# Patient Record
Sex: Female | Born: 1955 | Race: White | Hispanic: No | Smoking: Former smoker
Health system: Southern US, Community
[De-identification: ages and names within clinical notes are randomized; demographics above are authoritative.]

## PROBLEM LIST (undated history)

## (undated) DIAGNOSIS — J449 Chronic obstructive pulmonary disease, unspecified: Secondary | ICD-10-CM

## (undated) DIAGNOSIS — M549 Dorsalgia, unspecified: Secondary | ICD-10-CM

## (undated) DIAGNOSIS — R000 Tachycardia, unspecified: Secondary | ICD-10-CM

## (undated) DIAGNOSIS — M1990 Unspecified osteoarthritis, unspecified site: Secondary | ICD-10-CM

## (undated) DIAGNOSIS — G40409 Other generalized epilepsy and epileptic syndromes, not intractable, without status epilepticus: Secondary | ICD-10-CM

## (undated) DIAGNOSIS — H409 Unspecified glaucoma: Secondary | ICD-10-CM

## (undated) DIAGNOSIS — Z780 Asymptomatic menopausal state: Secondary | ICD-10-CM

## (undated) DIAGNOSIS — R569 Unspecified convulsions: Secondary | ICD-10-CM

## (undated) DIAGNOSIS — G8929 Other chronic pain: Secondary | ICD-10-CM

## (undated) DIAGNOSIS — G43909 Migraine, unspecified, not intractable, without status migrainosus: Secondary | ICD-10-CM

## (undated) DIAGNOSIS — G4700 Insomnia, unspecified: Secondary | ICD-10-CM

## (undated) DIAGNOSIS — I7090 Unspecified atherosclerosis: Secondary | ICD-10-CM

## (undated) DIAGNOSIS — G2581 Restless legs syndrome: Secondary | ICD-10-CM

## (undated) DIAGNOSIS — F32A Depression, unspecified: Secondary | ICD-10-CM

## (undated) DIAGNOSIS — R519 Headache, unspecified: Secondary | ICD-10-CM

## (undated) DIAGNOSIS — Z8719 Personal history of other diseases of the digestive system: Secondary | ICD-10-CM

## (undated) DIAGNOSIS — X838XXA Intentional self-harm by other specified means, initial encounter: Secondary | ICD-10-CM

## (undated) DIAGNOSIS — F329 Major depressive disorder, single episode, unspecified: Secondary | ICD-10-CM

## (undated) DIAGNOSIS — R51 Headache: Secondary | ICD-10-CM

## (undated) DIAGNOSIS — J189 Pneumonia, unspecified organism: Secondary | ICD-10-CM

## (undated) DIAGNOSIS — F419 Anxiety disorder, unspecified: Secondary | ICD-10-CM

## (undated) HISTORY — DX: Asymptomatic menopausal state: Z78.0

## (undated) HISTORY — DX: Other chronic pain: G89.29

## (undated) HISTORY — DX: Anxiety disorder, unspecified: F41.9

## (undated) HISTORY — DX: Dorsalgia, unspecified: M54.9

## (undated) HISTORY — DX: Intentional self-harm by other specified means, initial encounter: X83.8XXA

## (undated) HISTORY — DX: Unspecified convulsions: R56.9

## (undated) HISTORY — DX: Insomnia, unspecified: G47.00

---
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
## 1982-10-05 HISTORY — PX: TUBAL LIGATION: SHX77

## 1993-10-05 ENCOUNTER — Encounter (INDEPENDENT_AMBULATORY_CARE_PROVIDER_SITE_OTHER): Payer: Self-pay | Admitting: *Deleted

## 1993-10-05 LAB — CONVERTED CEMR LAB

## 1998-05-22 ENCOUNTER — Encounter: Admission: RE | Admit: 1998-05-22 | Discharge: 1998-05-22 | Payer: Self-pay | Admitting: Family Medicine

## 1998-06-24 ENCOUNTER — Encounter: Admission: RE | Admit: 1998-06-24 | Discharge: 1998-06-24 | Payer: Self-pay | Admitting: Family Medicine

## 1998-07-12 ENCOUNTER — Encounter: Admission: RE | Admit: 1998-07-12 | Discharge: 1998-07-12 | Payer: Self-pay | Admitting: Family Medicine

## 1998-09-13 ENCOUNTER — Encounter: Admission: RE | Admit: 1998-09-13 | Discharge: 1998-09-13 | Payer: Self-pay | Admitting: Family Medicine

## 1998-10-05 HISTORY — PX: ABDOMINAL HYSTERECTOMY: SHX81

## 1999-03-04 ENCOUNTER — Encounter: Admission: RE | Admit: 1999-03-04 | Discharge: 1999-03-04 | Payer: Self-pay | Admitting: Family Medicine

## 1999-03-12 ENCOUNTER — Encounter: Admission: RE | Admit: 1999-03-12 | Discharge: 1999-03-12 | Payer: Self-pay | Admitting: Family Medicine

## 1999-03-26 ENCOUNTER — Encounter: Admission: RE | Admit: 1999-03-26 | Discharge: 1999-03-26 | Payer: Self-pay | Admitting: Family Medicine

## 1999-03-27 ENCOUNTER — Other Ambulatory Visit: Admission: RE | Admit: 1999-03-27 | Discharge: 1999-03-27 | Payer: Self-pay

## 1999-03-27 ENCOUNTER — Encounter: Admission: RE | Admit: 1999-03-27 | Discharge: 1999-03-27 | Payer: Self-pay | Admitting: Obstetrics

## 1999-03-27 ENCOUNTER — Other Ambulatory Visit: Admission: RE | Admit: 1999-03-27 | Discharge: 1999-03-27 | Payer: Self-pay | Admitting: Obstetrics

## 2002-02-11 ENCOUNTER — Encounter: Payer: Self-pay | Admitting: *Deleted

## 2002-02-11 ENCOUNTER — Emergency Department (HOSPITAL_COMMUNITY): Admission: EM | Admit: 2002-02-11 | Discharge: 2002-02-11 | Payer: Self-pay | Admitting: *Deleted

## 2002-03-21 ENCOUNTER — Encounter: Admission: RE | Admit: 2002-03-21 | Discharge: 2002-03-21 | Payer: Self-pay | Admitting: Family Medicine

## 2002-06-08 ENCOUNTER — Encounter: Admission: RE | Admit: 2002-06-08 | Discharge: 2002-06-08 | Payer: Self-pay | Admitting: Family Medicine

## 2002-07-24 ENCOUNTER — Encounter: Admission: RE | Admit: 2002-07-24 | Discharge: 2002-07-24 | Payer: Self-pay | Admitting: Family Medicine

## 2002-12-08 ENCOUNTER — Encounter: Admission: RE | Admit: 2002-12-08 | Discharge: 2002-12-08 | Payer: Self-pay | Admitting: Family Medicine

## 2003-05-08 ENCOUNTER — Encounter: Admission: RE | Admit: 2003-05-08 | Discharge: 2003-05-08 | Payer: Self-pay | Admitting: Sports Medicine

## 2004-11-20 ENCOUNTER — Emergency Department (HOSPITAL_COMMUNITY): Admission: EM | Admit: 2004-11-20 | Discharge: 2004-11-20 | Payer: Self-pay | Admitting: Emergency Medicine

## 2004-11-28 ENCOUNTER — Ambulatory Visit (HOSPITAL_COMMUNITY): Admission: RE | Admit: 2004-11-28 | Discharge: 2004-11-28 | Payer: Self-pay | Admitting: Orthopaedic Surgery

## 2005-02-04 ENCOUNTER — Encounter (HOSPITAL_COMMUNITY): Admission: RE | Admit: 2005-02-04 | Discharge: 2005-03-06 | Payer: Self-pay | Admitting: Orthopaedic Surgery

## 2005-04-17 ENCOUNTER — Ambulatory Visit: Payer: Self-pay | Admitting: Family Medicine

## 2005-07-01 ENCOUNTER — Emergency Department (HOSPITAL_COMMUNITY): Admission: EM | Admit: 2005-07-01 | Discharge: 2005-07-01 | Payer: Self-pay | Admitting: Emergency Medicine

## 2005-07-08 ENCOUNTER — Emergency Department (HOSPITAL_COMMUNITY): Admission: EM | Admit: 2005-07-08 | Discharge: 2005-07-09 | Payer: Self-pay | Admitting: Emergency Medicine

## 2005-07-17 ENCOUNTER — Ambulatory Visit: Payer: Self-pay | Admitting: Family Medicine

## 2005-10-05 HISTORY — PX: BILATERAL OOPHORECTOMY: SHX1221

## 2005-12-09 ENCOUNTER — Ambulatory Visit (HOSPITAL_COMMUNITY): Admission: RE | Admit: 2005-12-09 | Discharge: 2005-12-09 | Payer: Self-pay | Admitting: Orthopaedic Surgery

## 2006-01-15 ENCOUNTER — Ambulatory Visit: Payer: Self-pay | Admitting: Family Medicine

## 2006-02-12 ENCOUNTER — Ambulatory Visit: Payer: Self-pay | Admitting: Family Medicine

## 2006-02-26 ENCOUNTER — Ambulatory Visit: Payer: Self-pay | Admitting: Family Medicine

## 2006-03-02 ENCOUNTER — Ambulatory Visit: Payer: Self-pay | Admitting: Gastroenterology

## 2006-03-04 ENCOUNTER — Encounter: Admission: RE | Admit: 2006-03-04 | Discharge: 2006-03-04 | Payer: Self-pay | Admitting: Sports Medicine

## 2006-03-05 ENCOUNTER — Ambulatory Visit: Payer: Self-pay | Admitting: Gastroenterology

## 2006-03-12 ENCOUNTER — Ambulatory Visit: Payer: Self-pay | Admitting: Family Medicine

## 2006-03-16 ENCOUNTER — Ambulatory Visit (HOSPITAL_COMMUNITY): Admission: RE | Admit: 2006-03-16 | Discharge: 2006-03-16 | Payer: Self-pay | Admitting: Family Medicine

## 2006-03-26 ENCOUNTER — Ambulatory Visit: Payer: Self-pay | Admitting: Family Medicine

## 2006-03-26 ENCOUNTER — Ambulatory Visit: Payer: Self-pay | Admitting: Gynecology

## 2006-03-31 ENCOUNTER — Ambulatory Visit: Payer: Self-pay | Admitting: Obstetrics and Gynecology

## 2006-04-09 ENCOUNTER — Ambulatory Visit: Payer: Self-pay | Admitting: Gastroenterology

## 2006-04-20 ENCOUNTER — Ambulatory Visit: Payer: Self-pay | Admitting: Gastroenterology

## 2006-04-27 ENCOUNTER — Encounter: Admission: RE | Admit: 2006-04-27 | Discharge: 2006-04-27 | Payer: Self-pay | Admitting: Family Medicine

## 2006-05-04 ENCOUNTER — Encounter: Admission: RE | Admit: 2006-05-04 | Discharge: 2006-05-04 | Payer: Self-pay | Admitting: Family Medicine

## 2006-05-04 ENCOUNTER — Ambulatory Visit: Payer: Self-pay | Admitting: Family Medicine

## 2006-05-12 ENCOUNTER — Ambulatory Visit: Payer: Self-pay | Admitting: Obstetrics & Gynecology

## 2006-05-12 ENCOUNTER — Encounter: Payer: Self-pay | Admitting: Obstetrics & Gynecology

## 2006-05-17 ENCOUNTER — Ambulatory Visit: Payer: Self-pay | Admitting: Family Medicine

## 2006-06-08 ENCOUNTER — Encounter (INDEPENDENT_AMBULATORY_CARE_PROVIDER_SITE_OTHER): Payer: Self-pay | Admitting: *Deleted

## 2006-06-08 ENCOUNTER — Ambulatory Visit: Payer: Self-pay | Admitting: Obstetrics & Gynecology

## 2006-06-08 ENCOUNTER — Inpatient Hospital Stay (HOSPITAL_COMMUNITY): Admission: RE | Admit: 2006-06-08 | Discharge: 2006-06-10 | Payer: Self-pay | Admitting: Obstetrics & Gynecology

## 2006-06-15 ENCOUNTER — Ambulatory Visit: Payer: Self-pay | Admitting: Obstetrics and Gynecology

## 2006-07-14 ENCOUNTER — Ambulatory Visit: Payer: Self-pay | Admitting: Obstetrics and Gynecology

## 2006-07-20 ENCOUNTER — Ambulatory Visit: Payer: Self-pay | Admitting: Family Medicine

## 2006-12-01 ENCOUNTER — Ambulatory Visit: Payer: Self-pay | Admitting: Family Medicine

## 2006-12-02 DIAGNOSIS — F341 Dysthymic disorder: Secondary | ICD-10-CM

## 2006-12-02 DIAGNOSIS — G2589 Other specified extrapyramidal and movement disorders: Secondary | ICD-10-CM

## 2006-12-02 DIAGNOSIS — G4700 Insomnia, unspecified: Secondary | ICD-10-CM

## 2006-12-02 DIAGNOSIS — J309 Allergic rhinitis, unspecified: Secondary | ICD-10-CM | POA: Insufficient documentation

## 2006-12-02 DIAGNOSIS — R55 Syncope and collapse: Secondary | ICD-10-CM | POA: Insufficient documentation

## 2006-12-02 DIAGNOSIS — K219 Gastro-esophageal reflux disease without esophagitis: Secondary | ICD-10-CM

## 2006-12-02 DIAGNOSIS — M545 Low back pain: Secondary | ICD-10-CM

## 2006-12-03 ENCOUNTER — Encounter (INDEPENDENT_AMBULATORY_CARE_PROVIDER_SITE_OTHER): Payer: Self-pay | Admitting: *Deleted

## 2006-12-15 ENCOUNTER — Encounter (INDEPENDENT_AMBULATORY_CARE_PROVIDER_SITE_OTHER): Payer: Self-pay | Admitting: Family Medicine

## 2006-12-20 ENCOUNTER — Ambulatory Visit: Payer: Self-pay | Admitting: Family Medicine

## 2006-12-20 DIAGNOSIS — F17200 Nicotine dependence, unspecified, uncomplicated: Secondary | ICD-10-CM

## 2007-01-12 ENCOUNTER — Telehealth: Payer: Self-pay | Admitting: *Deleted

## 2007-01-13 ENCOUNTER — Ambulatory Visit: Payer: Self-pay | Admitting: Family Medicine

## 2007-01-18 ENCOUNTER — Telehealth: Payer: Self-pay | Admitting: *Deleted

## 2007-02-01 ENCOUNTER — Ambulatory Visit: Payer: Self-pay | Admitting: Family Medicine

## 2007-02-18 ENCOUNTER — Telehealth: Payer: Self-pay | Admitting: *Deleted

## 2007-03-08 ENCOUNTER — Telehealth: Payer: Self-pay | Admitting: *Deleted

## 2007-03-08 ENCOUNTER — Ambulatory Visit: Payer: Self-pay | Admitting: *Deleted

## 2007-03-31 ENCOUNTER — Telehealth (INDEPENDENT_AMBULATORY_CARE_PROVIDER_SITE_OTHER): Payer: Self-pay | Admitting: Family Medicine

## 2007-04-01 ENCOUNTER — Telehealth: Payer: Self-pay | Admitting: *Deleted

## 2007-04-04 ENCOUNTER — Ambulatory Visit: Payer: Self-pay | Admitting: Sports Medicine

## 2007-04-04 DIAGNOSIS — Z8719 Personal history of other diseases of the digestive system: Secondary | ICD-10-CM | POA: Insufficient documentation

## 2007-04-13 ENCOUNTER — Telehealth (INDEPENDENT_AMBULATORY_CARE_PROVIDER_SITE_OTHER): Payer: Self-pay | Admitting: Family Medicine

## 2007-04-17 ENCOUNTER — Telehealth (INDEPENDENT_AMBULATORY_CARE_PROVIDER_SITE_OTHER): Payer: Self-pay | Admitting: Family Medicine

## 2007-04-19 ENCOUNTER — Encounter (INDEPENDENT_AMBULATORY_CARE_PROVIDER_SITE_OTHER): Payer: Self-pay | Admitting: Family Medicine

## 2007-06-02 ENCOUNTER — Encounter (INDEPENDENT_AMBULATORY_CARE_PROVIDER_SITE_OTHER): Payer: Self-pay | Admitting: Family Medicine

## 2007-07-14 ENCOUNTER — Telehealth (INDEPENDENT_AMBULATORY_CARE_PROVIDER_SITE_OTHER): Payer: Self-pay | Admitting: Family Medicine

## 2007-08-16 ENCOUNTER — Telehealth: Payer: Self-pay | Admitting: *Deleted

## 2007-08-29 ENCOUNTER — Ambulatory Visit: Payer: Self-pay | Admitting: Sports Medicine

## 2007-08-29 ENCOUNTER — Encounter: Admission: RE | Admit: 2007-08-29 | Discharge: 2007-08-29 | Payer: Self-pay | Admitting: Sports Medicine

## 2007-08-31 ENCOUNTER — Telehealth (INDEPENDENT_AMBULATORY_CARE_PROVIDER_SITE_OTHER): Payer: Self-pay | Admitting: Family Medicine

## 2007-09-05 ENCOUNTER — Ambulatory Visit: Payer: Self-pay | Admitting: Sports Medicine

## 2007-10-04 ENCOUNTER — Telehealth: Payer: Self-pay | Admitting: *Deleted

## 2007-10-05 ENCOUNTER — Telehealth (INDEPENDENT_AMBULATORY_CARE_PROVIDER_SITE_OTHER): Payer: Self-pay | Admitting: Family Medicine

## 2007-10-12 ENCOUNTER — Encounter (INDEPENDENT_AMBULATORY_CARE_PROVIDER_SITE_OTHER): Payer: Self-pay | Admitting: Family Medicine

## 2007-12-14 ENCOUNTER — Encounter (INDEPENDENT_AMBULATORY_CARE_PROVIDER_SITE_OTHER): Payer: Self-pay | Admitting: Family Medicine

## 2008-01-03 ENCOUNTER — Telehealth (INDEPENDENT_AMBULATORY_CARE_PROVIDER_SITE_OTHER): Payer: Self-pay | Admitting: Family Medicine

## 2008-01-16 ENCOUNTER — Encounter (INDEPENDENT_AMBULATORY_CARE_PROVIDER_SITE_OTHER): Payer: Self-pay | Admitting: Family Medicine

## 2008-01-16 ENCOUNTER — Ambulatory Visit: Payer: Self-pay | Admitting: Family Medicine

## 2008-01-16 DIAGNOSIS — Z862 Personal history of diseases of the blood and blood-forming organs and certain disorders involving the immune mechanism: Secondary | ICD-10-CM | POA: Insufficient documentation

## 2008-01-20 ENCOUNTER — Telehealth: Payer: Self-pay | Admitting: *Deleted

## 2008-01-20 ENCOUNTER — Encounter (INDEPENDENT_AMBULATORY_CARE_PROVIDER_SITE_OTHER): Payer: Self-pay | Admitting: Family Medicine

## 2008-01-20 LAB — CONVERTED CEMR LAB
ALT: 12 units/L (ref 0–35)
AST: 16 units/L (ref 0–37)
Albumin: 4.2 g/dL (ref 3.5–5.2)
Calcium: 9 mg/dL (ref 8.4–10.5)
Chloride: 108 meq/L (ref 96–112)
Cholesterol: 198 mg/dL (ref 0–200)
Creatinine, Ser: 0.6 mg/dL (ref 0.40–1.20)
HCT: 39.8 % (ref 36.0–46.0)
HDL: 48 mg/dL (ref >39)
LDL Cholesterol: 115 mg/dL — ABNORMAL HIGH (ref 0–99)
MCV: 91.7 fL (ref 78.0–100.0)
Platelets: 239 10*3/uL (ref 150–400)
RDW: 13.9 % (ref 11.5–15.5)
Sodium: 147 meq/L — ABNORMAL HIGH (ref 135–145)
Total Bilirubin: 0.3 mg/dL (ref 0.3–1.2)
Total Protein: 6.8 g/dL (ref 6.0–8.3)
Triglycerides: 174 mg/dL — ABNORMAL HIGH (ref <150)

## 2008-01-23 ENCOUNTER — Encounter: Payer: Self-pay | Admitting: *Deleted

## 2008-02-02 ENCOUNTER — Telehealth (INDEPENDENT_AMBULATORY_CARE_PROVIDER_SITE_OTHER): Payer: Self-pay | Admitting: *Deleted

## 2008-02-10 ENCOUNTER — Encounter (INDEPENDENT_AMBULATORY_CARE_PROVIDER_SITE_OTHER): Payer: Self-pay | Admitting: Family Medicine

## 2008-02-10 ENCOUNTER — Telehealth: Payer: Self-pay | Admitting: *Deleted

## 2008-02-22 ENCOUNTER — Ambulatory Visit: Payer: Self-pay | Admitting: Family Medicine

## 2008-02-22 DIAGNOSIS — IMO0001 Reserved for inherently not codable concepts without codable children: Secondary | ICD-10-CM

## 2008-03-12 ENCOUNTER — Encounter: Admission: RE | Admit: 2008-03-12 | Discharge: 2008-03-19 | Payer: Self-pay | Admitting: Family Medicine

## 2008-05-14 ENCOUNTER — Encounter (INDEPENDENT_AMBULATORY_CARE_PROVIDER_SITE_OTHER): Payer: Self-pay | Admitting: Family Medicine

## 2008-05-22 ENCOUNTER — Telehealth (INDEPENDENT_AMBULATORY_CARE_PROVIDER_SITE_OTHER): Payer: Self-pay | Admitting: Family Medicine

## 2008-05-28 ENCOUNTER — Encounter (INDEPENDENT_AMBULATORY_CARE_PROVIDER_SITE_OTHER): Payer: Self-pay | Admitting: Family Medicine

## 2008-05-29 ENCOUNTER — Encounter (INDEPENDENT_AMBULATORY_CARE_PROVIDER_SITE_OTHER): Payer: Self-pay | Admitting: Family Medicine

## 2008-06-12 ENCOUNTER — Telehealth: Payer: Self-pay | Admitting: *Deleted

## 2008-06-13 ENCOUNTER — Encounter (INDEPENDENT_AMBULATORY_CARE_PROVIDER_SITE_OTHER): Payer: Self-pay | Admitting: *Deleted

## 2008-06-14 ENCOUNTER — Ambulatory Visit: Payer: Self-pay | Admitting: Family Medicine

## 2008-07-12 ENCOUNTER — Telehealth (INDEPENDENT_AMBULATORY_CARE_PROVIDER_SITE_OTHER): Payer: Self-pay | Admitting: Family Medicine

## 2008-09-10 ENCOUNTER — Telehealth: Payer: Self-pay | Admitting: *Deleted

## 2008-10-02 ENCOUNTER — Telehealth (INDEPENDENT_AMBULATORY_CARE_PROVIDER_SITE_OTHER): Payer: Self-pay | Admitting: Family Medicine

## 2008-10-18 ENCOUNTER — Telehealth: Payer: Self-pay | Admitting: *Deleted

## 2008-10-23 ENCOUNTER — Encounter (INDEPENDENT_AMBULATORY_CARE_PROVIDER_SITE_OTHER): Payer: Self-pay | Admitting: Family Medicine

## 2008-10-23 ENCOUNTER — Ambulatory Visit: Payer: Self-pay | Admitting: Family Medicine

## 2008-10-23 DIAGNOSIS — K5909 Other constipation: Secondary | ICD-10-CM

## 2008-10-23 DIAGNOSIS — R002 Palpitations: Secondary | ICD-10-CM | POA: Insufficient documentation

## 2008-10-24 ENCOUNTER — Telehealth (INDEPENDENT_AMBULATORY_CARE_PROVIDER_SITE_OTHER): Payer: Self-pay | Admitting: *Deleted

## 2008-10-26 ENCOUNTER — Ambulatory Visit: Payer: Self-pay | Admitting: Family Medicine

## 2008-10-30 ENCOUNTER — Encounter: Payer: Self-pay | Admitting: *Deleted

## 2008-10-31 ENCOUNTER — Telehealth (INDEPENDENT_AMBULATORY_CARE_PROVIDER_SITE_OTHER): Payer: Self-pay | Admitting: Family Medicine

## 2008-11-01 ENCOUNTER — Telehealth (INDEPENDENT_AMBULATORY_CARE_PROVIDER_SITE_OTHER): Payer: Self-pay | Admitting: Family Medicine

## 2008-11-26 ENCOUNTER — Telehealth (INDEPENDENT_AMBULATORY_CARE_PROVIDER_SITE_OTHER): Payer: Self-pay | Admitting: Family Medicine

## 2008-12-05 ENCOUNTER — Telehealth (INDEPENDENT_AMBULATORY_CARE_PROVIDER_SITE_OTHER): Payer: Self-pay | Admitting: Family Medicine

## 2008-12-11 ENCOUNTER — Telehealth (INDEPENDENT_AMBULATORY_CARE_PROVIDER_SITE_OTHER): Payer: Self-pay | Admitting: Family Medicine

## 2008-12-19 ENCOUNTER — Ambulatory Visit: Payer: Self-pay | Admitting: Family Medicine

## 2008-12-19 ENCOUNTER — Encounter (INDEPENDENT_AMBULATORY_CARE_PROVIDER_SITE_OTHER): Payer: Self-pay | Admitting: Family Medicine

## 2008-12-19 DIAGNOSIS — N951 Menopausal and female climacteric states: Secondary | ICD-10-CM

## 2008-12-19 LAB — CONVERTED CEMR LAB
ALT: 14 units/L (ref 0–35)
AST: 16 units/L (ref 0–37)
Alkaline Phosphatase: 62 units/L (ref 39–117)
BUN: 14 mg/dL (ref 6–23)
Creatinine, Ser: 0.65 mg/dL (ref 0.40–1.20)
Ferritin: 21 ng/mL (ref 10–291)
HCT: 43.7 % (ref 36.0–46.0)
Hemoglobin: 14.1 g/dL (ref 12.0–15.0)
MCHC: 32.3 g/dL (ref 30.0–36.0)
MCV: 90.7 fL (ref 78.0–100.0)
Platelets: 353 10*3/uL (ref 150–400)
RDW: 14.5 % (ref 11.5–15.5)
Total Bilirubin: 0.2 mg/dL — ABNORMAL LOW (ref 0.3–1.2)

## 2008-12-20 ENCOUNTER — Telehealth: Payer: Self-pay | Admitting: *Deleted

## 2008-12-24 ENCOUNTER — Encounter (INDEPENDENT_AMBULATORY_CARE_PROVIDER_SITE_OTHER): Payer: Self-pay | Admitting: Family Medicine

## 2008-12-24 LAB — CONVERTED CEMR LAB: TSH: 1.682 microintl units/mL (ref 0.350–4.500)

## 2008-12-27 ENCOUNTER — Ambulatory Visit: Payer: Self-pay | Admitting: Internal Medicine

## 2008-12-27 ENCOUNTER — Encounter: Payer: Self-pay | Admitting: Internal Medicine

## 2008-12-28 ENCOUNTER — Inpatient Hospital Stay (HOSPITAL_COMMUNITY): Admission: AC | Admit: 2008-12-28 | Discharge: 2008-12-29 | Payer: Self-pay

## 2008-12-28 ENCOUNTER — Encounter: Payer: Self-pay | Admitting: Family Medicine

## 2008-12-28 ENCOUNTER — Ambulatory Visit: Payer: Self-pay | Admitting: Family Medicine

## 2009-01-11 ENCOUNTER — Telehealth: Payer: Self-pay | Admitting: *Deleted

## 2009-01-18 ENCOUNTER — Telehealth (INDEPENDENT_AMBULATORY_CARE_PROVIDER_SITE_OTHER): Payer: Self-pay | Admitting: *Deleted

## 2009-01-22 ENCOUNTER — Encounter (INDEPENDENT_AMBULATORY_CARE_PROVIDER_SITE_OTHER): Payer: Self-pay | Admitting: Family Medicine

## 2009-01-22 ENCOUNTER — Ambulatory Visit: Payer: Self-pay | Admitting: Family Medicine

## 2009-01-22 DIAGNOSIS — F068 Other specified mental disorders due to known physiological condition: Secondary | ICD-10-CM

## 2009-01-22 LAB — CONVERTED CEMR LAB
ALT: 12 units/L (ref 0–35)
AST: 13 units/L (ref 0–37)
Albumin: 4.5 g/dL (ref 3.5–5.2)
BUN: 7 mg/dL (ref 6–23)
CO2: 26 meq/L (ref 19–32)
Calcium: 9.8 mg/dL (ref 8.4–10.5)
Chloride: 106 meq/L (ref 96–112)
Creatinine, Ser: 0.65 mg/dL (ref 0.40–1.20)
HCT: 40.6 % (ref 36.0–46.0)
Hemoglobin: 13.7 g/dL (ref 12.0–15.0)
Platelets: 269 10*3/uL (ref 150–400)
Potassium: 3.6 meq/L (ref 3.5–5.3)
RDW: 14.7 % (ref 11.5–15.5)
Vitamin B-12: 594 pg/mL (ref 211–911)
WBC: 6.9 10*3/uL (ref 4.0–10.5)

## 2009-02-15 ENCOUNTER — Encounter (INDEPENDENT_AMBULATORY_CARE_PROVIDER_SITE_OTHER): Payer: Self-pay | Admitting: *Deleted

## 2009-02-18 ENCOUNTER — Emergency Department (HOSPITAL_COMMUNITY): Admission: EM | Admit: 2009-02-18 | Discharge: 2009-02-18 | Payer: Self-pay | Admitting: Emergency Medicine

## 2009-02-18 ENCOUNTER — Telehealth (INDEPENDENT_AMBULATORY_CARE_PROVIDER_SITE_OTHER): Payer: Self-pay | Admitting: *Deleted

## 2009-02-22 ENCOUNTER — Telehealth (INDEPENDENT_AMBULATORY_CARE_PROVIDER_SITE_OTHER): Payer: Self-pay | Admitting: Family Medicine

## 2009-02-22 ENCOUNTER — Telehealth (INDEPENDENT_AMBULATORY_CARE_PROVIDER_SITE_OTHER): Payer: Self-pay | Admitting: *Deleted

## 2009-03-25 ENCOUNTER — Telehealth: Payer: Self-pay | Admitting: *Deleted

## 2009-03-25 ENCOUNTER — Telehealth (INDEPENDENT_AMBULATORY_CARE_PROVIDER_SITE_OTHER): Payer: Self-pay | Admitting: Family Medicine

## 2009-04-03 ENCOUNTER — Encounter (INDEPENDENT_AMBULATORY_CARE_PROVIDER_SITE_OTHER): Payer: Self-pay | Admitting: Family Medicine

## 2009-04-19 ENCOUNTER — Ambulatory Visit: Payer: Self-pay | Admitting: Family Medicine

## 2009-05-23 ENCOUNTER — Ambulatory Visit: Payer: Self-pay | Admitting: Family Medicine

## 2009-06-05 ENCOUNTER — Emergency Department (HOSPITAL_COMMUNITY): Admission: EM | Admit: 2009-06-05 | Discharge: 2009-06-05 | Payer: Self-pay | Admitting: Emergency Medicine

## 2009-06-17 ENCOUNTER — Telehealth: Payer: Self-pay | Admitting: Psychology

## 2009-06-24 ENCOUNTER — Telehealth (INDEPENDENT_AMBULATORY_CARE_PROVIDER_SITE_OTHER): Payer: Self-pay | Admitting: *Deleted

## 2009-06-24 ENCOUNTER — Telehealth: Payer: Self-pay | Admitting: *Deleted

## 2009-06-25 ENCOUNTER — Ambulatory Visit: Payer: Self-pay | Admitting: Family Medicine

## 2009-06-25 ENCOUNTER — Telehealth: Payer: Self-pay | Admitting: Family Medicine

## 2009-06-25 DIAGNOSIS — T7411XA Adult physical abuse, confirmed, initial encounter: Secondary | ICD-10-CM

## 2009-06-29 ENCOUNTER — Telehealth: Payer: Self-pay | Admitting: Family Medicine

## 2009-07-11 ENCOUNTER — Ambulatory Visit: Payer: Self-pay | Admitting: Psychology

## 2009-07-25 ENCOUNTER — Encounter: Payer: Self-pay | Admitting: Family Medicine

## 2009-07-25 ENCOUNTER — Ambulatory Visit: Payer: Self-pay | Admitting: Psychology

## 2009-07-25 ENCOUNTER — Ambulatory Visit: Payer: Self-pay | Admitting: Family Medicine

## 2009-08-01 ENCOUNTER — Telehealth: Payer: Self-pay | Admitting: Psychology

## 2009-08-07 ENCOUNTER — Telehealth: Payer: Self-pay | Admitting: Family Medicine

## 2009-08-14 ENCOUNTER — Encounter: Payer: Self-pay | Admitting: Family Medicine

## 2009-08-15 ENCOUNTER — Ambulatory Visit: Payer: Self-pay | Admitting: Psychology

## 2009-08-26 ENCOUNTER — Telehealth: Payer: Self-pay | Admitting: Family Medicine

## 2009-08-27 ENCOUNTER — Encounter: Payer: Self-pay | Admitting: Family Medicine

## 2009-10-09 ENCOUNTER — Encounter: Payer: Self-pay | Admitting: Family Medicine

## 2009-10-23 ENCOUNTER — Telehealth (INDEPENDENT_AMBULATORY_CARE_PROVIDER_SITE_OTHER): Payer: Self-pay | Admitting: *Deleted

## 2009-10-28 ENCOUNTER — Telehealth (INDEPENDENT_AMBULATORY_CARE_PROVIDER_SITE_OTHER): Payer: Self-pay | Admitting: *Deleted

## 2009-11-26 ENCOUNTER — Ambulatory Visit: Payer: Self-pay | Admitting: Family Medicine

## 2009-12-18 ENCOUNTER — Encounter: Payer: Self-pay | Admitting: *Deleted

## 2009-12-24 ENCOUNTER — Telehealth: Payer: Self-pay | Admitting: Psychology

## 2009-12-31 ENCOUNTER — Ambulatory Visit: Payer: Self-pay | Admitting: Family Medicine

## 2010-01-15 ENCOUNTER — Encounter: Payer: Self-pay | Admitting: Family Medicine

## 2010-01-15 ENCOUNTER — Ambulatory Visit: Payer: Self-pay | Admitting: Family Medicine

## 2010-01-27 ENCOUNTER — Encounter: Payer: Self-pay | Admitting: Family Medicine

## 2010-02-12 ENCOUNTER — Telehealth: Payer: Self-pay | Admitting: Family Medicine

## 2010-03-04 ENCOUNTER — Ambulatory Visit: Payer: Self-pay | Admitting: Family Medicine

## 2010-03-04 ENCOUNTER — Telehealth (INDEPENDENT_AMBULATORY_CARE_PROVIDER_SITE_OTHER): Payer: Self-pay | Admitting: *Deleted

## 2010-03-06 ENCOUNTER — Encounter: Admission: RE | Admit: 2010-03-06 | Discharge: 2010-03-06 | Payer: Self-pay | Admitting: Family Medicine

## 2010-03-10 ENCOUNTER — Encounter: Payer: Self-pay | Admitting: Family Medicine

## 2010-03-28 ENCOUNTER — Observation Stay (HOSPITAL_COMMUNITY): Admission: EM | Admit: 2010-03-28 | Discharge: 2010-03-29 | Payer: Self-pay | Admitting: Emergency Medicine

## 2010-03-28 ENCOUNTER — Encounter: Payer: Self-pay | Admitting: Family Medicine

## 2010-03-28 ENCOUNTER — Ambulatory Visit: Payer: Self-pay | Admitting: Family Medicine

## 2010-04-04 ENCOUNTER — Ambulatory Visit: Payer: Self-pay | Admitting: Family Medicine

## 2010-04-08 ENCOUNTER — Telehealth: Payer: Self-pay | Admitting: Family Medicine

## 2010-04-21 ENCOUNTER — Telehealth: Payer: Self-pay | Admitting: *Deleted

## 2010-04-25 ENCOUNTER — Ambulatory Visit: Payer: Self-pay | Admitting: Family Medicine

## 2010-04-29 ENCOUNTER — Ambulatory Visit: Payer: Self-pay | Admitting: Internal Medicine

## 2010-05-01 ENCOUNTER — Encounter: Admission: RE | Admit: 2010-05-01 | Discharge: 2010-05-01 | Payer: Self-pay | Admitting: Orthopedic Surgery

## 2010-05-01 ENCOUNTER — Encounter: Payer: Self-pay | Admitting: Family Medicine

## 2010-05-02 ENCOUNTER — Ambulatory Visit: Payer: Self-pay

## 2010-05-06 ENCOUNTER — Telehealth: Payer: Self-pay | Admitting: Psychology

## 2010-05-12 ENCOUNTER — Encounter: Admission: RE | Admit: 2010-05-12 | Discharge: 2010-05-12 | Payer: Self-pay | Admitting: Orthopedic Surgery

## 2010-05-12 ENCOUNTER — Telehealth: Payer: Self-pay | Admitting: *Deleted

## 2010-05-14 ENCOUNTER — Telehealth (INDEPENDENT_AMBULATORY_CARE_PROVIDER_SITE_OTHER): Payer: Self-pay | Admitting: *Deleted

## 2010-05-15 ENCOUNTER — Encounter: Payer: Self-pay | Admitting: Internal Medicine

## 2010-05-15 ENCOUNTER — Ambulatory Visit: Payer: Self-pay | Admitting: Internal Medicine

## 2010-05-15 ENCOUNTER — Encounter (HOSPITAL_COMMUNITY): Admission: RE | Admit: 2010-05-15 | Discharge: 2010-06-23 | Payer: Self-pay | Admitting: Internal Medicine

## 2010-05-15 ENCOUNTER — Ambulatory Visit (HOSPITAL_COMMUNITY): Admission: RE | Admit: 2010-05-15 | Discharge: 2010-05-15 | Payer: Self-pay | Admitting: Internal Medicine

## 2010-05-15 ENCOUNTER — Ambulatory Visit: Payer: Self-pay

## 2010-06-02 ENCOUNTER — Encounter: Admission: RE | Admit: 2010-06-02 | Discharge: 2010-06-02 | Payer: Self-pay | Admitting: Orthopedic Surgery

## 2010-06-16 ENCOUNTER — Encounter (INDEPENDENT_AMBULATORY_CARE_PROVIDER_SITE_OTHER): Payer: Self-pay | Admitting: *Deleted

## 2010-06-24 ENCOUNTER — Telehealth: Payer: Self-pay | Admitting: Family Medicine

## 2010-06-27 ENCOUNTER — Encounter: Payer: Self-pay | Admitting: *Deleted

## 2010-07-02 ENCOUNTER — Ambulatory Visit: Payer: Self-pay | Admitting: Family Medicine

## 2010-07-02 ENCOUNTER — Telehealth: Payer: Self-pay | Admitting: Family Medicine

## 2010-07-02 ENCOUNTER — Encounter: Payer: Self-pay | Admitting: Family Medicine

## 2010-07-02 ENCOUNTER — Encounter: Payer: Self-pay | Admitting: *Deleted

## 2010-07-07 LAB — CONVERTED CEMR LAB
Amphetamine Screen, Ur: NEGATIVE
Barbiturate Quant, Ur: NEGATIVE
Cocaine Metabolites: NEGATIVE
Methadone: NEGATIVE
Opiates: NEGATIVE

## 2010-07-08 ENCOUNTER — Telehealth: Payer: Self-pay | Admitting: Family Medicine

## 2010-07-09 ENCOUNTER — Telehealth: Payer: Self-pay | Admitting: *Deleted

## 2010-07-09 ENCOUNTER — Telehealth: Payer: Self-pay | Admitting: Family Medicine

## 2010-07-10 ENCOUNTER — Encounter: Payer: Self-pay | Admitting: Family Medicine

## 2010-07-10 ENCOUNTER — Encounter: Payer: Self-pay | Admitting: *Deleted

## 2010-07-13 ENCOUNTER — Emergency Department (HOSPITAL_COMMUNITY): Admission: EM | Admit: 2010-07-13 | Discharge: 2010-07-13 | Payer: Self-pay | Admitting: Emergency Medicine

## 2010-07-14 ENCOUNTER — Emergency Department (HOSPITAL_COMMUNITY): Admission: EM | Admit: 2010-07-14 | Discharge: 2010-07-15 | Payer: Self-pay | Admitting: Emergency Medicine

## 2010-07-15 ENCOUNTER — Ambulatory Visit: Payer: Self-pay | Admitting: Family Medicine

## 2010-07-17 ENCOUNTER — Telehealth: Payer: Self-pay | Admitting: Family Medicine

## 2010-08-01 ENCOUNTER — Telehealth: Payer: Self-pay | Admitting: Family Medicine

## 2010-08-04 ENCOUNTER — Encounter: Payer: Self-pay | Admitting: *Deleted

## 2010-08-04 ENCOUNTER — Encounter: Payer: Self-pay | Admitting: Family Medicine

## 2010-08-10 ENCOUNTER — Emergency Department (HOSPITAL_COMMUNITY): Admission: EM | Admit: 2010-08-10 | Discharge: 2010-08-10 | Payer: Self-pay | Admitting: Emergency Medicine

## 2010-08-18 ENCOUNTER — Ambulatory Visit: Payer: Self-pay | Admitting: Family Medicine

## 2010-09-01 ENCOUNTER — Telehealth: Payer: Self-pay | Admitting: *Deleted

## 2010-09-02 ENCOUNTER — Telehealth: Payer: Self-pay | Admitting: Family Medicine

## 2010-09-03 ENCOUNTER — Encounter: Payer: Self-pay | Admitting: Family Medicine

## 2010-09-03 ENCOUNTER — Telehealth: Payer: Self-pay | Admitting: Family Medicine

## 2010-09-17 ENCOUNTER — Telehealth: Payer: Self-pay | Admitting: Family Medicine

## 2010-09-23 ENCOUNTER — Telehealth: Payer: Self-pay | Admitting: *Deleted

## 2010-09-23 ENCOUNTER — Ambulatory Visit: Payer: Self-pay | Admitting: Family Medicine

## 2010-09-28 ENCOUNTER — Emergency Department (HOSPITAL_COMMUNITY)
Admission: EM | Admit: 2010-09-28 | Discharge: 2010-09-28 | Payer: Self-pay | Source: Home / Self Care | Admitting: Emergency Medicine

## 2010-10-17 ENCOUNTER — Telehealth: Payer: Self-pay | Admitting: Family Medicine

## 2010-10-26 ENCOUNTER — Encounter: Payer: Self-pay | Admitting: Family Medicine

## 2010-11-03 ENCOUNTER — Telehealth (INDEPENDENT_AMBULATORY_CARE_PROVIDER_SITE_OTHER): Payer: Self-pay | Admitting: *Deleted

## 2010-11-04 NOTE — Letter (Signed)
Summary: [facility] [employer] - Medical Form  [employer] [facility] [employer] - Medical Form   Imported By: Clydell Hakim 11/27/2009 14:02:47  _____________________________________________________________________  External Attachment:    Type:   Image     Comment:   External Document

## 2010-11-04 NOTE — Letter (Signed)
Summary: Suspension Letter  [facility]  [address]   [place], [place] [address]   Phone: [phone]  Fax: [phone]    08/04/2010  JESIKA MEN [facility] [address] [place], [place]  [address]  Dear Ms. [facility],  You have missed 4 scheduled appointments with our practice.If you cannot keep your appointment, we expect you to call and cancel at least 24 hours before your appointment time.  As per our policy, we will now only give you limited medical services. means we will not call in a refill for you, or complete a form or make a referral except when you are here for a scheduled office visit.   If you miss 2 more appointments in the next year, we will dismiss you from our practice.  We hope this does not happen.  If you keep your appointments for the next year you will be returned to regular patient status.  We hope these changes will encourage you to keep your appointments so we may provide you the best medical care.   Our office staff can be reached at [phone] Monday through Friday from 8:30 a.m.-5:00 p.m. and will be glad to schedule your appointment as necessary.     Sincerely,   [facility]

## 2010-11-04 NOTE — Progress Notes (Signed)
Summary: Rx Req   Phone Note Refill Request Call back at Home Phone [phone] Message from:  Patient  Refills Requested: Medication #1:  TRAMADOL HCL 50 MG TABS one tab every 6 hours as needed for pain. PT SAYS THE QUANITY NEEDS TO BE ADJUSTED TO 90 PILLS A MONTH.  PT TAKING 2 A DAY. PHARMACY [facility] [address]  Initial call taken by: Clydell Hakim,  August 01, 2010 11:03 AM  Follow-up for Phone Call        will forward to MD. Follow-up by: Theresia Lo RN,  August 01, 2010 12:16 PM  Additional Follow-up for Phone Call Additional follow up Details #1::        pt states she cannot go the weekend without meds Additional Follow-up by: De Nurse,  August 01, 2010 4:53 PM    Additional Follow-up for Phone Call Additional follow up Details #2::    Will refill for #60.  Needs to contact PCP to disucss if needs more Follow-up by: Pearlean Brownie MD,  August 01, 2010 5:08 PM  Prescriptions: TRAMADOL HCL 50 MG TABS (TRAMADOL HCL) one tab every 6 hours as needed for pain  #60 x 0   Entered and Authorized by:   Pearlean Brownie MD   Signed by:   Pearlean Brownie MD on 08/01/2010   Method used:   [facility]  [address] [phone]* (retail)       [address]       [place], [place]  [address]       Ph: [phone]       Fax: [phone]   RxID:   [id number]

## 2010-11-04 NOTE — Assessment & Plan Note (Signed)
Summary: Cardiology Nuclear Testing  Nuclear Med Background Indications for Stress Test: Evaluation for Ischemia   History: Emphysema  History Comments: Chronic Low BP  History of 3 Suicide Attempts Emphysema per CXR  Symptoms: Chest Tightness, Chest Tightness with Exertion, Diaphoresis, Dizziness, DOE, Fatigue, Fatigue with Exertion, Light-Headedness, Nausea, Palpitations, Rapid HR, Syncope, Vomiting  Symptoms Comments: Multiple Syncope Episodes   Nuclear Pre-Procedure Cardiac Risk Factors: RBBB, Smoker Caffeine/Decaff Intake: Yes NPO After: 8:00 PM Lungs: Clear IV 0.9% NS with Angio Cath: 24g     IV Site: (R) Lower Arm IV Started by: Bonnita Levan, RN Chest Size (in) 34     Cup Size B     Height (in): 60.75 Weight (lb): 100 BMI: 19.12  Nuclear Med Study 1 or 2 day study:  1 day     Stress Test Type:  Stress Reading MD:  Arvilla Meres, MD     Referring MD:  Hillis Range Resting Radionuclide:  Technetium 41m Tetrofosmin     Resting Radionuclide Dose:  11.0 mCi  Stress Radionuclide:  Technetium 21m Tetrofosmin     Stress Radionuclide Dose:  33.0 mCi   Stress Protocol Exercise Time (min):  5:15 min     Max HR:  150 bpm     Predicted Max HR:  168 bpm  Max Systolic BP: 167 mm Hg     Percent Max HR:  89.29 %     METS: 7.0 Rate Pressure Product:  13086    Stress Test Technologist:  Irean Hong RN     Nuclear Technologist:  Harlow Asa CNMT  Rest Procedure  Myocardial perfusion imaging was performed at rest 45 minutes following the intravenous administration of Myoview Technetium 28m Tetrofosmin.  Stress Procedure  The patient exercised for 5 minutes and 15 seconds, RPE=15.   The patient stopped due to DOE, bilateral thigh pain 6/10,   and denied any chest pain.  There were no significant ST-T wave changes. The rhythm changed from NSR to atrial ectopic rhythm at 5 minutes in recovery.  Myoview was injected at peak exercise and myocardial perfusion imaging was performed  after a brief delay.  QPS Raw Data Images:  Normal; no motion artifact; normal heart/lung ratio. Stress Images:  There is normal uptake in all areas. Rest Images:  Normal homogeneous uptake in all areas of the myocardium. Subtraction (SDS):  Normal Transient Ischemic Dilatation:  1.04  (Normal <1.22)  Lung/Heart Ratio:  .25  (Normal <0.45)  Quantitative Gated Spect Images QGS EDV:  64 ml QGS ESV:  19 ml QGS EF:  70 %   Overall Impression  Exercise Capacity: Fair exercise capacity. BP Response: Hypertensive blood pressure response. Clinical Symptoms: Dyspnea ECG Impression: No significant ST segment change suggestive of ischemia. Overall Impression: Normal stress nuclear study. Overall Impression Comments: Ectopic atrial rhythm (normal rate) during recovery.  Appended Document: Cardiology Nuclear Testing Please inform the patient of her normal result   Appended Document: Cardiology Nuclear Testing pt aware

## 2010-11-04 NOTE — Progress Notes (Signed)
Summary: prior auth   Phone Note Call from Patient Call back at Work Phone [phone]   Caller: Patient Summary of Call: needs prior auth for LYRICA 75 MG CAPS before pharmacy can fill it [facility] [address]  Initial call taken by: De Nurse,  April 08, 2010 10:51 AM  Follow-up for Phone Call        PA form filled out and placed in MD box. Follow-up by: Theresia Lo RN,  April 08, 2010 11:20 AM  Additional Follow-up for Phone Call Additional follow up Details #1::        PA requires  trial in last 60 days of tricyclic, gaba    Additional Follow-up for Phone Call Additional follow up Details #2::    have placed prior authroization in fax pile.  Follow-up by: Delbert Harness MD,  April 09, 2010 1:25 PM   Appended Document: prior auth approved for 1 yr. faxed the forms to her pharmacy

## 2010-11-04 NOTE — Progress Notes (Signed)
Summary: Pain meds   ---- Converted from flag ---- ---- 07/08/2010 4:01 PM, Delbert Harness MD wrote: Thanks Huntley Dec!  ~Kim  ---- 07/08/2010 3:51 PM, Jimmy Footman, CMA wrote: Dr. Earnest Bailey, I spoke with Arsema and informed her of what you said. She states that she was here on 9/28 and "needs" to get more pain meds. Look at the ov notes from there. 07/02/2010. looks like she was having withdrawals from benzo. She is now starting to demand pain meds. I told her that you would not make and rx changes over the phone and for her to make an OV with you to address getting meds changed. She also says she has a appt with you on 10/11. I asked if she could wait til then and is saying she rather not but still wants to keep that appt with you, Thanks Huntley Dec ------------------------------
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
## 2010-11-04 NOTE — Progress Notes (Signed)
Summary: Rx Req  Medications Added CLONAZEPAM 1 MG TABS (CLONAZEPAM) take one tablet twice a day as needed for anxiety       Phone Note Refill Request Call back at Home Phone [phone] Message from:  Patient  Refills Requested: Medication #1:  CLONAZEPAM 1 MG TABS take one tablet twice a day as needed for anxiety- Fill on or after 09/25/09 [facility] [address]  pt is now out for 2 days  Initial call taken by: Clydell Hakim,  October 28, 2009 10:56 AM  Follow-up for Phone Call        Please call in to pharmacy.  Thanks! Follow-up by: Delbert Harness MD,  October 28, 2009 3:17 PM    New/Updated Medications: CLONAZEPAM 1 MG TABS (CLONAZEPAM) take one tablet twice a day as needed for anxiety Prescriptions: CLONAZEPAM 1 MG TABS (CLONAZEPAM) take one tablet twice a day as needed for anxiety  #60 x 0   Entered by:   Delbert Harness MD   Authorized by:   . RN TEAM-[facility]   Signed by:   Delbert Harness MD on 10/28/2009   Method used:   Historical   RxID:   [id number]  rx called to pharmacy and patient notified. encouraged her to keep appointment scheduled on 11/18/2009 with PCP. Theresia Lo RN  October 28, 2009 3:58 PM
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
## 2010-11-04 NOTE — Progress Notes (Signed)
Summary: wants neurology referral   Phone Note Call from Patient   Caller: Patient Call For: [phone] Summary of Call: Patient has been waiting for referral to neurologist.  Was suppose to have received after appt with Layten Aiken.  Please contact asap.  Pt distraught about not being able to remember things.   Initial call taken by: Abundio Miu,  July 09, 2010 2:51 PM  Follow-up for Phone Call        complaints of memory are longstanding for this patient.  Will address need for referral at next visit. Follow-up by: Delbert Harness MD,  July 09, 2010 5:57 PM

## 2010-11-04 NOTE — Progress Notes (Signed)
Summary: refill   Phone Note Refill Request Call back at Home Phone [phone] Call back at [phone] Message from:  Patient  Refills Requested: Medication #1:  ULTRAM 50 MG  TABS 1 tablet by mouth three times a day as needed for severe back pain.   Notes: needs #90 to last her 1 month Next Appointment Scheduled: 10/11 Initial call taken by: De Nurse,  June 24, 2010 8:53 AM  Follow-up for Phone Call        I have refilled tramadol #60 which is what was prescribed before.  Follow-up by: Delbert Harness MD,  June 24, 2010 10:36 AM    Prescriptions: Janean Sark 50 MG  TABS (TRAMADOL HCL) 1 tablet by mouth three times a day as needed for severe back pain.  #60 x 0   Entered and Authorized by:   Delbert Harness MD   Signed by:   Delbert Harness MD on 06/24/2010   Method used:   Electronically to        [facility]  [address] [phone]* (retail)       [address]       [place], [place]  [address]       Ph: [phone]       Fax: [phone]   RxID:   [id number]   Appended Document: refill she was concerned because if she takes 3xper day, it will not last her for 30 days.

## 2010-11-04 NOTE — Progress Notes (Signed)
Summary: Rx Ques   Phone Note Call from Patient Call back at Work Phone [phone]   Caller: Patient Summary of Call: Pt got nicotine patches while in the hospital and wonders if medicaid would pay for these if Dr. Earnest Bailey perscribed them for her. Initial call taken by: Clydell Hakim,  April 08, 2010 2:01 PM  Follow-up for Phone Call        will forward to MD.  Follow-up by: Theresia Lo RN,  April 08, 2010 3:21 PM  Additional Follow-up for Phone Call Additional follow up Details #1::        Medicaid will likely pay for them.  Ask patient to make appointment to discuss smoking cessation so I can prescribe the right dose and help her get started. Additional Follow-up by: Delbert Harness MD,  April 09, 2010 1:17 PM    Additional Follow-up for Phone Call Additional follow up Details #2::    Pt has f/up appt on 7/22 and will talk to Dr. Earnest Bailey about this then. Follow-up by: Clydell Hakim,  April 09, 2010 2:09 PM

## 2010-11-04 NOTE — Letter (Signed)
Summary: [facility]: Neuro referral, pain meds given  [facility]   Imported By: Clydell Hakim 07/17/2010 11:28:18  _____________________________________________________________________  External Attachment:    Type:   Image     Comment:   External Document

## 2010-11-04 NOTE — Progress Notes (Signed)
Summary: referral   Phone Note Call from Patient Call back at Home Phone [phone]   Caller: Patient Summary of Call: needs referral to Pain Clinic Initial call taken by: De Nurse,  September 02, 2010 4:44 PM  Follow-up for Phone Call        see my response 11/30.  New referrals ned to be discussed in office. Follow-up by: Delbert Harness MD,  September 03, 2010 11:03 AM

## 2010-11-04 NOTE — Progress Notes (Signed)
Summary: wants pain meds   Phone Note Call from Patient Call back at Home Phone [phone]   Caller: Patient Summary of Call: needs to speak with Dr Earnest Bailey about back pain- injections made pain worse - not sure what to do - appt is 10/11  Initial call taken by: De Nurse,  July 08, 2010 10:26 AM  Follow-up for Phone Call        I am not aware of any back injections.  Please have patient contact whoever she got back injections from.  Please have records sent to this office. Follow-up by: Delbert Harness MD,  July 08, 2010 10:56 AM  Additional Follow-up for Phone Call Additional follow up Details #1::        LVM for pt to call back. Inform pt that because her pain is a chronic problem dr. Earnest Bailey will not change rx over the phone. Dr. Earnest Bailey will discuss any pain med at a office visit. Pt is to make a office visit if wanting meds changed or this chronic problem talked about. Additional Follow-up by: Jimmy Footman, CMA,  July 08, 2010 1:54 PM    Additional Follow-up for Phone Call Additional follow up Details #2::    Spoke with Tabitha @ [facility] she is faxing over records sent over [facility] to [facility]. Follow-up by: Jimmy Footman, CMA,  July 08, 2010 2:15 PM  Additional Follow-up for Phone Call Additional follow up Details #3:: Details for Additional Follow-up Action Taken: Spoke with pt and sent dr Earnest Bailey a flag on that conversation Additional Follow-up by: Jimmy Footman, CMA,  July 08, 2010 3:53 PM

## 2010-11-04 NOTE — Assessment & Plan Note (Signed)
Summary: f/u eo   Vital Signs:  Patient profile:   55 year old female Menstrual status:  postmenopausal Weight:      101.5 pounds Temp:     98.4 degrees F oral Pulse rate:   87 / minute Pulse rhythm:   regular BP sitting:   96 / 73  (left arm) Cuff size:   regular  Vitals Entered By: Loralee Pacas CMA (April 25, 2010 10:01 AM) Comments pt stated that this visit was supposed to be about her f/u with the lyrica, but she stated that she is having issues with her back. she is hurting all over. she is having a lot of "confusion" episods, pt very teary when speaking about her condition.  Daughter in law is voicing concern over the medications that she is on she states that she twitches in her sleep, hard for her to move or do anything. when she falls asleep and wakes up she doesn't know where she is.   Primary Care Provider:  Delbert Harness MD   History of Present Illness: 55 yo with chronic pain here for follow-up  says her back pain is worsening and "wants the surgery now"  We have never discussed surgery.  When I ask her what she wants surgery for she says cause i hurt- and points to chest wall, arms and back.  reviewed lumbarmri results with her   Continues to have stool leakage.  Missd GI eferral appointment  Continues to have multiple episodes of syncopcal-like episodes- did not make follow-up appt with cardiologist.  Since starting lyrica feels like anxiety and pain no better.  Family (daughter in law) here today concerned with somnolence, confusion and night-time limb movements that they are concerned with are seziure activity from the hospital.  Current Medications (verified): 1)  Celexa 40 Mg Tabs (Citalopram Hydrobromide) .... Take 1 and 1/2 Tablet By Mouth Daily 2)  Clonazepam 1 Mg Tabs (Clonazepam) .... Take One Tablet Twice A Day As Needed For Anxiety- Written Prescriptions Given For Fill On 04/04/10 05/05/10 #60 3)  Ultram 50 Mg  Tabs (Tramadol Hcl) .Marland Kitchen.. 1 Tablet By Mouth Three  Times A Day As Needed For Severe Back Pain.  Fill On or After 04/25/10#90 4)  Diclofenac Sodium 75 Mg Tbec (Diclofenac Sodium) .Marland Kitchen.. 1 Tablet By Mouth Two Times A Day As Needed For Pain - Take With Food 5)  Miralax  Powd (Polyethylene Glycol 3350) .Marland Kitchen.. 1 Capful in 8 Oz. of Clear Fluid / Juice Once or Twice Daily For Constipation - Titrate For Bm Every Other Day - Disp 1 Jar 6)  Cetirizine Hcl 10 Mg Tabs (Cetirizine Hcl) .Marland Kitchen.. 1 Tablet By Mouth At Bedtime As Needed For Allergies 7)  Sm Calcium-Vitamin D 600-400 Mg-Unit Tabs (Calcium Carbonate-Vitamin D) .... Take Two Tablets Daily 8)  Lyrica 75 Mg Caps (Pregabalin) .... Take One Tablet Twice A Day  Allergies: No Known Drug Allergies PMH-FH-SH reviewed-no changes except otherwise noted  Review of Systems      See HPI  Physical Exam  General:  Vitals reviewed.  Thin female.  Here with daughter in law today.  Tearful throughout visit. Psych:  Oriented X3, depressed affect, tearful, and slightly anxious.     Impression & Recommendations:  Problem # 1:  BACK PAIN, LOW (ICD-724.2)  No changes in medication regimen today.  Patient continues to miss specialist appointments and missed her appointment with [facility] in June as well as GI for anal leakage.  Told her once she is able  to keep appointments here and with her cardiologist, will work on other referrals.  I do not think any of her symptooms are related tio spinal cord compromise.    Her updated medication list for this problem includes:    Ultram 50 Mg Tabs (Tramadol hcl) .Marland Kitchen... 1 tablet by mouth three times a day as needed for severe back pain.  fill on or after 04/25/10#90    Diclofenac Sodium 75 Mg Tbec (Diclofenac sodium) .Marland Kitchen... 1 tablet by mouth two times a day as needed for pain - take with food  Orders: [facility]- Est  Level 4 (04540)  Problem # 2:  ANXIETY DEPRESSION (ICD-300.4)  Continue clonazepam and lyrica.  Queried [place] controlled substances list and the only medications listed are ones  I have prescribed.  No change in medication today.  Her symptoms of "twitches", confusion, memory loss I dont beleive are related to medication or seizure activity as I explained to family.    [facility]- Est  Level 4 (98119)  Problem # 3:  SYNCOPE (ICD-780.2)  Patient given information for her cardiologist to facilitate her making appointment.  This is for some of the episodes taht she described as palpitations and sometimes she describes some "syncopal-like"  I have done this for over a year now and she continues to say she forgets or she did not have transportation but she does not call to cancel and says she has no limitation in ability to do this.  Orders: [facility]- Est  Level 4 (14782)  Problem # 4:  Preventive Health Care (ICD-V70.0) Unable to address due to patient being in crisis at each visit.  Will attempt to address at next visit.  Problem # 5:  CONSTIPATION, CHRONIC (ICD-564.09) with anal leakage.  Missed her GI referral appt in June.   Her updated medication list for this problem includes:    Miralax Powd (Polyethylene glycol 3350) .Marland Kitchen... 1 capful in 8 oz. of clear fluid / juice once or twice daily for constipation - titrate for bm every other day - disp 1 jar  Complete Medication List: 1)  Celexa 40 Mg Tabs (Citalopram hydrobromide) .... Take 1 and 1/2 tablet by mouth daily 2)  Clonazepam 1 Mg Tabs (Clonazepam) .... Take one tablet twice a day as needed for anxiety- written prescriptions given for fill on 04/04/10 05/05/10 #60 3)  Ultram 50 Mg Tabs (Tramadol hcl) .Marland Kitchen.. 1 tablet by mouth three times a day as needed for severe back pain.  fill on or after 04/25/10#90 4)  Diclofenac Sodium 75 Mg Tbec (Diclofenac sodium) .Marland Kitchen.. 1 tablet by mouth two times a day as needed for pain - take with food 5)  Miralax Powd (Polyethylene glycol 3350) .Marland Kitchen.. 1 capful in 8 oz. of clear fluid / juice once or twice daily for constipation - titrate for bm every other day - disp 1 jar 6)  Cetirizine Hcl 10 Mg Tabs  (Cetirizine hcl) .Marland Kitchen.. 1 tablet by mouth at bedtime as needed for allergies 7)  Sm Calcium-vitamin D 600-400 Mg-unit Tabs (Calcium carbonate-vitamin d) .... Take two tablets daily 8)  Lyrica 75 Mg Caps (Pregabalin) .... Take one tablet twice a day  Patient Instructions: 1)  1)  Make appt for further heart monitoring with [facility]= Dr. Johney Frame [phone]

## 2010-11-04 NOTE — Letter (Signed)
Summary: Generic Letter  [facility]  [address]   [place], [place] [address]   Phone: [phone]  Fax: [phone]    12/18/2009  KAMI KUBE [address] [place], [place]  [address]  Dear Ms. [facility],  Dr. Earnest Bailey has requested that you make a follow up appointment so that Micah can be seen before perscribing any medications for him.  Please call our office at [phone] to set up an appointment.  Thank you in advance.         Sincerely,   Loralee Pacas CMA

## 2010-11-04 NOTE — Letter (Signed)
Summary: Suspension Letter  [facility]  [address]   [place], [place] [address]   Phone: [phone]  Fax: [phone]    08/04/2010  Joretta R KIRKMAN [address] [place], [place]  [address]  Dear Ms. KIRKMAN,  You have missed 4 scheduled appointments with our practice.If you cannot keep your appointment, we expect you to call and cancel at least 24 hours before your appointment time.  As per our policy, we will now only give you limited medical services. means we will not call in a refill for you, or complete a form or make a referral except when you are here for a scheduled office visit.   If you miss 2 more appointments in the next year, we will dismiss you from our practice.  We hope this does not happen.  If you keep your appointments for the next year you will be returned to regular patient status.  We hope these changes will encourage you to keep your appointments so we may provide you the best medical care.   Our office staff can be reached at [phone] Monday through Friday from 8:30 a.m.-5:00 p.m. and will be glad to schedule your appointment as necessary.     Sincerely,   [facility]   
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
## 2010-11-04 NOTE — Progress Notes (Signed)
Summary: phn msg   Phone Note Call from Patient Call back at [phone]   Caller: Patient Reason for Call: Referral Summary of Call: Pt was once set up to see a cardiologist and did not go.  Wants to be referred again for this.   Initial call taken by: Clydell Hakim,  October 23, 2009 11:58 AM  Follow-up for Phone Call        To PCP was ordered in Mar Follow-up by: Gladstone Pih,  October 23, 2009 12:43 PM  Additional Follow-up for Phone Call Additional follow up Details #1::        She saw Dr. Johney Frame with [facility] in march.  She does not need a referral to follow-up with him.  Please ask her to call for appointment. Additional Follow-up by: Delbert Harness MD,  October 24, 2009 6:35 PM    Additional Follow-up for Phone Call Additional follow up Details #2::    pt states that she has not been to Cardiologist yet and they will not make 1st appt for her. Follow-up by: De Nurse,  October 25, 2009 9:54 AM  message left on voicemail to return call. Theresia Lo RN  October 25, 2009 10:07 AM   Spoke with patient and she still cannot remember seeing cardiologist . explained to patient that there is a note  in chart from Dr. Johney Frame. Appointment scheduled for 10/30/2009 at 12:00 Noon to follow up with Dr. Johney Frame. patient advised. Theresia Lo RN  October 25, 2009 10:12 AM

## 2010-11-04 NOTE — Progress Notes (Signed)
   Phone Note Outgoing Call   Summary of Call: Pt needed [place] to call pharmacy because her meds were being filled early.  I told pharmacy this was ok to do today. Initial call taken by: Sarah Swaziland MD,  July 02, 2010 1:33 PM

## 2010-11-04 NOTE — Assessment & Plan Note (Signed)
Summary: Left before being seen- Needs updated Contact info,df  pt was upset and left because she had to wait about 30 min. said she had been waiting for over an hour but pt was here about 2 hours early for her appt and 2 1/2 hours early for her son's appt. pt was also upset because she said her son was out of meds and could not attend school without it. she said her ride was here to pick her up and could not wait any longer but her son's scheduled appt was not for another 5 min. rx was written and handed to pt for her son and was told to reschedule appt for any futher refills.Tessie Fass CMA  December 31, 2009 4:43 PM   Vital Signs:  Patient profile:   55 year old female Menstrual status:  postmenopausal Height:      60.75 inches Weight:      101 pounds Temp:     97.7 degrees F oral BP sitting:   100 / 62  (left arm) Cuff size:   regular  Vitals Entered By: Tessie Fass CMA (December 31, 2009 3:53 PM) CC: f/'u Is Patient Diabetic? No Pain Assessment Patient in pain? yes     Location: lower back Intensity: 6   CC:  f/'u.  Habits & Providers  Alcohol-Tobacco-Diet     Tobacco Status: current     Cigarette Packs/Day: 0.5  Allergies: No Known Drug Allergies  Social History: Packs/Day:  0.5   Complete Medication List: 1)  Celexa 40 Mg Tabs (Citalopram hydrobromide) .... Take 1 and 1/2 tablet by mouth daily 2)  Clonazepam 1 Mg Tabs (Clonazepam) .... Take one tablet twice a day as needed for anxiety- written prescriptions given for fill on 11/28/2009 and 12/26/2009 3)  Ultram 50 Mg Tabs (Tramadol hcl) .Marland Kitchen.. 1 tablet by mouth three times a day as needed for severe back pain 4)  Diclofenac Sodium 75 Mg Tbec (Diclofenac sodium) .Marland Kitchen.. 1 tablet by mouth two times a day as needed for pain - take with food 5)  Miralax Powd (Polyethylene glycol 3350) .Marland Kitchen.. 1 capful in 8 oz. of clear fluid / juice once or twice daily for constipation - titrate for bm every other day - disp 1 jar 6)  Cetirizine  Hcl 10 Mg Tabs (Cetirizine hcl) .Marland Kitchen.. 1 tablet by mouth at bedtime as needed for allergies 7)  Sm Calcium-vitamin D 600-400 Mg-unit Tabs (Calcium carbonate-vitamin d) .... Take two tablets daily  Other Orders: No Charge Patient Arrived (NCPA0) (NCPA0)
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
## 2010-11-04 NOTE — Assessment & Plan Note (Signed)
Summary: f/u meds,df   Vital Signs:  Patient profile:   55 year old female Menstrual status:  postmenopausal Height:      60.75 inches Weight:      110.9 pounds BMI:     21.20 Temp:     98.4 degrees F oral Pulse rate:   76 / minute BP sitting:   117 / 79  (left arm) Cuff size:   regular  Vitals Entered By: Jimmy Footman, CMA (August 18, 2010 11:56 AM) CC: f/u med pain. Observed patient picking up grandchild with no problems or observation of pain. She asked about pain meds 2 times from me before she seen Dr. Earnest Bailey. She did not complain of pain until she was in room and upset about not being able to get pain meds refilled as she would like. She states that she had a seizure and is being treated like nothing happened. She states that she is being treated like a drug addict and no other dr. office will even "touch" her. As i was leaving i offered a book to her grandson and she was standing up the said to me "no there is one in his baookbag and bent over with ease to get it". I noticed no excertion or signs of pain. States that she is very upset with her care but doesn't want to go anywhere else and she feels like she should be able to get pain meds filled at anytime due to her sciatica. Is Patient Diabetic? No Pain Assessment Patient in pain? yes        Primary Care Provider:  Delbert Harness MD  CC:  f/u med pain. Observed patient picking up grandchild with no problems or observation of pain. She asked about pain meds 2 times from me before she seen Dr. Earnest Bailey. She did not complain of pain until she was in room and upset about not being able to get pain meds refilled as she would like. She states that she had a seizure and is being treated like nothing happened. She states that she is being treated like a drug addict and no other dr. office will even "touch" her. As i was leaving i offered a book to her grandson and she was standing up the said to me "no there is one in his baookbag and bent  over with ease to get it". I noticed no excertion or signs of pain. States that she is very upset with her care but doesn't want to go anywhere else and she feels like she should be able to get pain meds filled at anytime due to her sciatica..  History of Present Illness: 55 yo here to discuss pain  patient drove herself to today's visit.  Since last appointment she states she has not follow-up with neurology as she cannot drive to [place] to be seen due to having to take care of her grandchildren.  She says she will only go to [facility] which who will not see her currently due to not showing for an appontment.  She says she is currenlty in the process of re-establishing care there with the help of a patient advocate from her recent visit to the ER for pain.  She tells me she was given oxycodone at this time and requests this today as the only thing that can help her.    She has not taken increased dose of lyrica yet, does not taken tramadol any longer.  Habits & Providers  Alcohol-Tobacco-Diet  Alcohol drinks/day: 0     Tobacco Status: current     Tobacco Counseling: to quit use of tobacco products     Cigarette Packs/Day: 0.5  Exercise-Depression-Behavior     Have you felt down or hopeless? yes     Have you felt little pleasure in things? yes     Depression Counseling: further diagnostic testing and/or other treatment is indicated  Allergies: No Known Drug Allergies  Past History:  Past Medical History: Drug Seeking Behavior TUBAL LIGATION, HX OF (ICD-V26.51) HOT FLASHES (ICD-627.2) CONSTIPATION, CHRONIC (ICD-564.09) PALPITATIONS, OCCASIONAL (ICD-785.1) ANEMIA, IRON DEFICIENCY, HX OF (ICD-V12.3) DIVERTICULOSIS, COLON, HX OF (ICD-V12.79) TOBACCO ABUSE (ICD-305.1) SYNCOPE (ICD-780.2) Possible seizure disorder RHINITIS, ALLERGIC (ICD-477.9) RESTLESS LEGS SYNDROME (ICD-333.99) INSOMNIA NOS (ICD-780.52) GASTROESOPHAGEAL REFLUX, NO ESOPHAGITIS  (ICD-530.81) DEPRESSION, MAJOR, RECURRENT (ICD-296.30) BACK PAIN, LOW (ICD-724.2)  Chronic Low Back Pain since 2000 s/p TAH - multiple prior imaging studies with no signficant pathology G3 P3, NSVD h/o 3 suicide attempts - 1/81 valium overdose history of dysfunctional uterine bleeding - s/p TAH for fibroids - benign prior iron deficiency anemia, resolved (1999) EGD in Aug 2007 - reportedly normal   (10/23/2008)   PMH-FH-SH reviewed for relevance  Review of Systems      See HPI  Physical Exam  General:  Tearful, angry , disheveled.  Walking quickly out of exam room, normal gait.   Impression & Recommendations:  Problem # 1:  BACK PAIN, LOW (ICD-724.2)  I advised patient I would not be prescribing narcotics for her back pain and advised to follow-up with neuroigst and orthopedist. Patient became very upset.  I recommended she pick up prescritpon from last month for increased dose of lyrica.  I gave her refill for clonazepam.  I did not realize at the time of encounter that patient was also prescribed valium as well as narcotics and prednisone ER.  Further review of ER records show that patient also recieved percocet in october.  She has clearly violated her controlled substances contract multiple times and today has exhibited drug seeking behavior.  I will no longer be prescribing her these medications.  She was unhappy and asked to speak with Dennison Nancy.  I advised that Olegario Messier would call her back.  Her preferred number is [phone].  The following medications were removed from the medication list:    Tramadol Hcl 50 Mg Tabs (Tramadol hcl) ..... One tab every 6 hours as needed for pain  Orders: [facility]- Est Level  3 (99213)  Complete Medication List: 1)  Celexa 40 Mg Tabs (Citalopram hydrobromide) .... Take 1 tablet daily 2)  Lyrica 150 Mg Caps (Pregabalin) .... Take one tablet twice a day  Patient Instructions: 1)  I am sorry you are in so much pain today. 2)  It is important  you see the specialists Dr. Sherlean Foot referred you to and  follow-up with him as needed. 3)  I would be happy to forward any records that would be helpful. Prescriptions: CLONAZEPAM 1 MG TABS (CLONAZEPAM) take one tablet twice a day as needed  #60 x 0   Entered and Authorized by:   Delbert Harness MD   Signed by:   Delbert Harness MD on 08/18/2010   Method used:   Print then Give to Patient   RxID:   [id number]    Orders Added: 1)  [facility]- Est Level  3 [62130]

## 2010-11-04 NOTE — Progress Notes (Signed)
Summary: Request for mental health services   Phone Note Call from Patient   Caller: Patient Call For: Spero Geralds, Psy.D. Summary of Call: Patient called to request a Beh med appt.  Scheduled for August 15th at 10:00. Initial call taken by: Spero Geralds PsyD,  May 06, 2010 12:32 PM

## 2010-11-04 NOTE — Miscellaneous (Signed)
Summary: [facility] Controlled Substance Contract  [facility] Cont[facility] Substance Contract   Imported By: Clydell Hakim 01/22/2010 11:32:36  _____________________________________________________________________  External Attachment:    Type:   Image     Comment:   External Document

## 2010-11-04 NOTE — Assessment & Plan Note (Signed)
Summary: f/u chronic back pain,df   Vital Signs:  Patient profile:   55 year old female Menstrual status:  postmenopausal Weight:      114.8 pounds Temp:     98.7 degrees F oral Pulse rate:   77 / minute Pulse rhythm:   regular BP sitting:   97 / 72  (right arm) Cuff size:   regular  Vitals Entered By: Loralee Pacas CMA (July 15, 2010 11:06 AM) CC: follow-up visit   Primary Care Provider:  Delbert Harness MD  CC:  follow-up visit.  History of Present Illness: 55 yo here today with husband and grandson  Chronic pain:   She says many conflicting thing such as Dr. Sherlean Foot is going to do back surgery but says her PCP needs to manage her back pain.  She later says Dr. Sherlean Foot gave her 3 back injections that did not work and is sending her to neurology for workup but she  does not know where she was referred.    When I discussed that we have other options for treating pain but that I would not be prescribing any habit forming pain medications she became angry. She says ultram is no longer working.  She blamed another doctor - could not remember name- in the practice of telling her clonazepam was not addictive "and now I'm addicted to it"  but stated she wanted to continue this for her restless legs.      Habits & Providers  Alcohol-Tobacco-Diet     Alcohol drinks/day: 0     Tobacco Status: current     Tobacco Counseling: to quit use of tobacco products     Cigarette Packs/Day: 0.5  Current Medications (verified): 1)  Celexa 40 Mg Tabs (Citalopram Hydrobromide) .... Take 1 Tablet Daily 2)  Clonazepam 1 Mg Tabs (Clonazepam) .... Take One Tablet Twice A Day As Needed 3)  Lyrica 75 Mg Caps (Pregabalin) .... Take One Tablet Twice A Day 4)  Gabapentin 300 Mg Caps (Gabapentin) .... Take One Tablet At Bedtime For 3 Days, Then Increase To One Tablet Three Times A Day  Allergies: No Known Drug Allergies PMH-FH-SH reviewed for relevance  Review of Systems      See HPI  Physical  Exam  General:  Tearful,angry , disheveled   Impression & Recommendations:  Problem # 1:  BACK PAIN, LOW (ICD-724.2)  States today she wants treatment for "sciatic nerve" pain as the most distrssing of her chronic pain.  She is onl lyrica, celexa.  Will d/c tramadol since she states she no longer gets any benefit from this.   Will start gabapentin for nerve pain.  patient declines any other referal- stating she has done everything to include physical therapy, a TENS unit, lidoderm patches.  Advised her to call Dr. Sherlean Foot and discuss the neurology referral he made with his office.  See instructions.  >30 minutes were spent in face to face time counseling patient  The following medications were removed from the medication list:    Ultram 50 Mg Tabs (Tramadol hcl) .Marland Kitchen... 1 tablet by mouth three times a day as needed for severe back pain.  Orders: [facility]- Est  Level 4 (54098)  Problem # 2:  Probation Discussed with patient probation status and her needing to do her part in order to continue having a therapeutic alliance.   She says she has never missed an appointment here and says all her missed referral were out of her control as it was due to not  having insurance, not having trasnportation, or not remembering which she states is a chronic medical condition.  I have printed her probation letter and given her a copy today.  Complete Medication List: 1)  Celexa 40 Mg Tabs (Citalopram hydrobromide) .... Take 1 tablet daily 2)  Clonazepam 1 Mg Tabs (Clonazepam) .... Take one tablet twice a day as needed 3)  Lyrica 75 Mg Caps (Pregabalin) .... Take one tablet twice a day 4)  Gabapentin 300 Mg Caps (Gabapentin) .... Take one tablet at bedtime for 3 days, then increase to one tablet three times a day  Patient Instructions: 1)  See probation letter. 2)  I will not refill or change any medications over the phone you must be seen in the office. 3)  make follow-up in 3 weeks to see how gabapentin is  going 4)  it is not safe for you to drive. Prescriptions: LYRICA 75 MG CAPS (PREGABALIN) take one tablet twice a day  #60 x 5   Entered and Authorized by:   Delbert Harness MD   Signed by:   Delbert Harness MD on 07/15/2010   Method used:   Printed then faxed to ...       [facility]  [address] [id number]       [address]       [place], [place]  [address]       Ph: [phone]       Fax: [phone]   RxID:   [id number] LYRICA 75 MG CAPS (PREGABALIN) take one tablet twice a day  #60 x 0   Entered and Authorized by:   Delbert Harness MD   Signed by:   Delbert Harness MD on 07/15/2010   Method used:   Printed then faxed to ...       [facility] [id number]       [address]       [place], [place]  [address]       Ph: [phone]       Fax: [phone]   RxID:   [id number] CELEXA 40 MG TABS (CITALOPRAM HYDROBROMIDE) Take 1 tablet daily  #30 x 5   Entered and Authorized by:   Delbert Harness MD   Signed by:   Delbert Harness MD on 07/15/2010   Method used:   [facility]ronically [facility]        [facility]  [address] [phone][facility] (retail)       [address]       [place], [place]  [address]       Ph: [phone]       Fax: [phone]   RxID:   [id number] GABAPENTIN 300 MG CAPS (GABAPENTIN) take one tablet at bedtime for 3 days, then increase to one tablet three times a day  #90 x 0   Entered and Authorized by:   Delbert Harness MD   Signed by:   Delbert Harness MD on 07/15/2010   Method used:   [facility]        [facility]  [address] [phone] (retail)       [address]       [place], [place]  [address]       Ph: [phone]       Fax: [phone]  RxID:   [id number] CLONAZEPAM 1 MG TABS (CLONAZEPAM) take one tablet twice a day as needed  #60 x 0   Entered and Authorized by:   Delbert Harness MD   Signed by:   Delbert Harness MD on 07/15/2010   Method used:   Print then Give to Patient   RxID:    [id number]   Appended Document: f/u chronic back pain,df Medications Added LYRICA 150 MG CAPS (PREGABALIN) take one tablet twice a day          Clinical Lists Changes  Problems: Assessed BACK PAIN, LOW as comment only - gabapentin sent in error as patient already on lyrica.  Left message on VM- instead will increase lyrica to 150mg  two times a day.  Prescription called into pharmacy.  Patient to take two  tabs of er pre-existing prescription until she runs out.  Would consider NSAID or lidoderm patch in the future although she declines patch at this time. Medications: Removed medication of GABAPENTIN 300 MG CAPS (GABAPENTIN) take one tablet at bedtime for 3 days, then increase to one tablet three times a day Changed medication from LYRICA 75 MG CAPS (PREGABALIN) take one tablet twice a day to LYRICA 150 MG CAPS (PREGABALIN) take one tablet twice a day - Signed Rx of LYRICA 150 MG CAPS (PREGABALIN) take one tablet twice a day;  #60 x 5;  Signed;  Entered by: Delbert Harness MD;  Authorized by: Delbert Harness MD;  Method used: Historical Observations: Added new observation of PRIMARY MD: Delbert Harness MD (07/16/2010 8:59)    Prescriptions: LYRICA 150 MG CAPS (PREGABALIN) take one tablet twice a day  #60 x 5   Entered and Authorized by:   Delbert Harness MD   Signed by:   Delbert Harness MD on 07/16/2010   Method used:   Historical   RxID:   [id number]      Impression & Recommendations:  Problem # 1:  BACK PAIN, LOW (ICD-724.2) gabapentin sent in error as patient already on lyrica.  Left message on VM- instead will increase lyrica to 150mg  two times a day.  Prescription called into pharmacy.  Patient to take two  tabs of er pre-existing prescription until she runs out.  Would consider NSAID or lidoderm patch in the future although she declines patch at this time.  Complete Medication List: 1)  Celexa 40 Mg Tabs (Citalopram hydrobromide) .... Take 1 tablet daily 2)  Clonazepam 1  Mg Tabs (Clonazepam) .... Take one tablet twice a day as needed 3)  Lyrica 150 Mg Caps (Pregabalin) .... Take one tablet twice a day

## 2010-11-04 NOTE — Assessment & Plan Note (Signed)
Summary: fill out form,tcb   Vital Signs:  Patient profile:   55 year old female Menstrual status:  postmenopausal Weight:      99.8 pounds Temp:     98.3 degrees F oral Pulse rate:   71 / minute Pulse rhythm:   regular BP sitting:   113 / 76  (left arm) Cuff size:   regular  Vitals Entered By: Loralee Pacas CMA (January 15, 2010 3:19 PM)   Primary Care Provider:  Delbert Harness MD   History of Present Illness: 55 yo here to discuss chronic pain and disability form  Chronic pain:  asks for early refill of tramadol today.  States she only takes it as prescribed.  Pain is mostly in lower back.  No change from baseline pain.  Disability form:  form filled out at last visit and sent to disability attorneys per patient request.  Patient asks  to repeat filling out of form due to attorney's request for mroe objective information and less of a report of what the patient states.  Patient also continues to add to list of diagnoses for which she feels disability is justified.  She informs me that she has an eye disorder of whcih I have no records for.  She has follow up with this eye doctor who has recently evaluated her.  I haev asked to her have records forwarded to me.  Depression/Anxiety:  patient continues to take Celexa and clonazepam as prescribed.  She feels like her visit with Dr. Pascal Lux was helpful and plans on rescheduling.  She no showed her last appointment.  Habits & Providers  Alcohol-Tobacco-Diet     Tobacco Status: current     Tobacco Counseling: to quit use of tobacco products     Cigarette Packs/Day: 0.5  Current Medications (verified): 1)  Celexa 40 Mg Tabs (Citalopram Hydrobromide) .... Take 1 and 1/2 Tablet By Mouth Daily 2)  Clonazepam 1 Mg Tabs (Clonazepam) .... Take One Tablet Twice A Day As Needed For Anxiety- Written Prescriptions Given For Fill On 01/26/2010 and 02/25/2010 #60 3)  Ultram 50 Mg  Tabs (Tramadol Hcl) .Marland Kitchen.. 1 Tablet By Mouth Three Times A Day As Needed For  Severe Back Pain.  Handwritten Fill On or After 01/24/10, Then 5/22 4)  Diclofenac Sodium 75 Mg Tbec (Diclofenac Sodium) .Marland Kitchen.. 1 Tablet By Mouth Two Times A Day As Needed For Pain - Take With Food 5)  Miralax  Powd (Polyethylene Glycol 3350) .Marland Kitchen.. 1 Capful in 8 Oz. of Clear Fluid / Juice Once or Twice Daily For Constipation - Titrate For Bm Every Other Day - Disp 1 Jar 6)  Cetirizine Hcl 10 Mg Tabs (Cetirizine Hcl) .Marland Kitchen.. 1 Tablet By Mouth At Bedtime As Needed For Allergies 7)  Sm Calcium-Vitamin D 600-400 Mg-Unit Tabs (Calcium Carbonate-Vitamin D) .... Take Two Tablets Daily  Allergies: No Known Drug Allergies PMH-FH-SH reviewed-no changes except otherwise noted  Review of Systems      See HPI General:  Denies fever and weakness. Eyes:  Complains of blurring. CV:  Denies fainting. MS:  Denies low back pain. Neuro:  Complains of difficulty with concentration and memory loss. Psych:  Complains of anxiety.  Physical Exam  General:  alert, appropriate dress, cooperative to examination, and good hygiene.  Thin, anxious appearing.   Impression & Recommendations:  Problem # 1:  BACK PAIN, LOW (ICD-724.2) Reviewed controlled substances contract and resigned today.  Denied request for early refill.  Given two paper prescriptions for refill or tramadol.  Discussed with patient that in order to get more objective information on her functional capacity, will need to refer to physical therapy.  I have requested a functional capacity evaluation to help her with further evaluation of her disability claim.  Encouraged her to get regular exercise and patient plans on applying to [facility] [employer] for scholarship.  Her updated medication list for this problem includes:    Ultram 50 Mg Tabs (Tramadol hcl) .Marland Kitchen... 1 tablet by mouth three times a day as needed for severe back pain.  handwritten fill on or after 01/24/10, then 5/22    Diclofenac Sodium 75 Mg Tbec (Diclofenac sodium) .Marland Kitchen... 1 tablet by mouth two times a day as  needed for pain - take with food  Orders: Physical Therapy Referral (PT) [facility]- Est Level  3 (69629)  Problem # 2:  DEPRESSION, MAJOR, RECURRENT (ICD-296.30)  Refilled clonazepam (Also takes for RLS).  continue cymbalta.  Encouraged continuation with Dr. Pascal Lux.  Orders: [facility]- Est Level  3 (99213)  Complete Medication List: 1)  Celexa 40 Mg Tabs (Citalopram hydrobromide) .... Take 1 and 1/2 tablet by mouth daily 2)  Clonazepam 1 Mg Tabs (Clonazepam) .... Take one tablet twice a day as needed for anxiety- written prescriptions given for fill on 01/26/2010 and 02/25/2010 #60 3)  Ultram 50 Mg Tabs (Tramadol hcl) .Marland Kitchen.. 1 tablet by mouth three times a day as needed for severe back pain.  handwritten fill on or after 01/24/10, then 5/22 4)  Diclofenac Sodium 75 Mg Tbec (Diclofenac sodium) .Marland Kitchen.. 1 tablet by mouth two times a day as needed for pain - take with food 5)  Miralax Powd (Polyethylene glycol 3350) .Marland Kitchen.. 1 capful in 8 oz. of clear fluid / juice once or twice daily for constipation - titrate for bm every other day - disp 1 jar 6)  Cetirizine Hcl 10 Mg Tabs (Cetirizine hcl) .Marland Kitchen.. 1 tablet by mouth at bedtime as needed for allergies 7)  Sm Calcium-vitamin D 600-400 Mg-unit Tabs (Calcium carbonate-vitamin d) .... Take two tablets daily  Patient Instructions: 1)  Will make referral to see physical therapy for Functional Capacity Evaluation to get more information about your disability. 2)  Please ask [employer] about programs to help subsidize your membership fee.  I think it is a great idea for you to get more exercise and it will help your overall function. 3)  Make follow-up appt with cardiology. 4)  Follow-up with me in 2-3 months or sooner if needed. Prescriptions: ULTRAM 50 MG  TABS (TRAMADOL HCL) 1 tablet by mouth three times a day as needed for severe back pain.  Handwritten fill on or after 01/24/10, then 5/22  #60 x 0   Entered and Authorized by:   Delbert Harness MD   Signed by:   Delbert Harness MD on  01/15/2010   Method used:   Historical   RxID:   [id number] CLONAZEPAM 1 MG TABS (CLONAZEPAM) take one tablet twice a day as needed for anxiety- written prescriptions given for fill on 01/26/2010 and 02/25/2010 #60  #60 x 1   Entered and Authorized by:   Delbert Harness MD   Signed by:   Delbert Harness MD on 01/15/2010   Method used:   Historical   RxID:   [id number]

## 2010-11-04 NOTE — Progress Notes (Signed)
Summary: Rx Prob   Phone Note Call from Patient Call back at [phone]   Caller: Patient Summary of Call: Pt says that the pharmacy would not fill due to how the instructions were written.  Needs to say as needed for pain. [facility] [address]  The pharmacy has the rx.  Can the pharmacy be called and the instructions be changed. Initial call taken by: Clydell Hakim,  Mar 04, 2010 1:34 PM  Follow-up for Phone Call        [facility] and was told that patient had a refill on Ultram 50 mg on 02/23/2010  and was given # 60 tabs.  this was a 20 day supply and medicaid will not pay for rx until 03/15/2010. called and spoke with patient and she states she is out of medication now because she has been taking the ultram 2 tabs three times daily for about 60 days she states. she did not tell Dr. Macario Golds this today at her appointment. Dr. Claudius Sis paged and she states since patient has signed a pain contract to consult preceptor about this as she does not feel comfortable increasing medication since she has only seen her once , just today.  consulted Dr. Leveda Anna and he advises that per contract patient had agreed to not increase meds on her on and that increased dose cannot be given. patient notified. Follow-up by: Theresia Lo RN,  Mar 04, 2010 4:06 PM

## 2010-11-04 NOTE — Assessment & Plan Note (Signed)
Summary: loss of bowels,tcb   Vital Signs:  Patient profile:   55 year old female Menstrual status:  postmenopausal Height:      60.75 inches Weight:      100.1 pounds Temp:     98.8 degrees F Pulse rate:   82 / minute BP sitting:   116 / 72  (right arm)  Vitals Entered By: Theresia Lo RN (Mar 04, 2010 10:52 AM) CC: back pain, sciatic pain ,  incontinence  of bowles Is Patient Diabetic? No Pain Assessment Patient in pain? yes     Location: back Intensity: 9 Type: sharp and aching   Primary Care Provider:  Delbert Harness MD  CC:  back pain, sciatic pain , and incontinence  of bowles.  History of Present Illness: Patient here for c/o fecal incontinence x 3 months.  She has a h/o chronic constipation with BMs q2-3 weeks.  She state sometimes however, she will eat something that seems to upset her stomach resulting in passage and leakage of soft, non-formed stool.  She denies any saddle anesthesia.  She does have a h/o 3 vaginal deliveries (20+ years ago), with an episiotomy.  She also c/o 10 lb weight loss in 2 weeks.  She was seen by GI in 2006 and had a EGD and C-scope at that time, dx with Diverticulosis.  She was supposed to follow up in 3 months but was unhappy with her care there so she did not return.    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 Patient is very frustrated about her worsening back pain and how it interferes with her daily life.  She desires surgery if possible.  She is on Tramadol three times a day, Clonazepam and Celexa for MDD.    Had an MRI in 2006 showing:   Central disc herniation L5-S1, abutting but not definitely exerting mass-effect upon the L5 roots.  Suspect very small lateral disc protrusion L4-L5 without mass-effect upon left L4 root. No definite cause for reticular symptoms to left lower extremity identified.    Patient also c/o chronic cough x 3 months.  She has a h/o mild allergic rhinitis, she denies any reflux  or belching.  She is a smoker 1/2 ppd.   Habits & Providers  Alcohol-Tobacco-Diet     Tobacco Status: current     Tobacco Counseling: to quit use of tobacco products     Cigarette Packs/Day: 0.5  Allergies: No Known Drug Allergies  Physical Exam  General:  Well-developed,well-nourished,in no acute distress; alert,appropriate and cooperative throughout examination. Very tearful and anxious. Ears:  External ear exam shows no significant lesions or deformities.  Otoscopic examination reveals clear canals, tympanic membranes are intact bilaterally without bulging, retraction, inflammation or discharge. Hearing is grossly normal bilaterally. Mouth:  Oral mucosa and oropharynx without lesions or exudates.  Teeth in good repair. Lungs:  Normal respiratory effort, chest expands symmetrically. Lungs are clear to auscultation, no crackles or wheezes. Heart:  Normal rate and regular rhythm. S1 and S2 normal without gallop, murmur, click, rub or other extra sounds. Abdomen:  Bowel sounds positive,abdomen soft and non-tender without masses, organomegaly or hernias noted. Rectal:  No external abnormalities noted. Sphincter tone mildly decreased. No rectal masses or tenderness. Small amount of hard stool in the vault. Guaiac negative.  Msk:  Negative straight leg raise tests bilaterally. Neurologic:  No cranial nerve deficits noted. Station and gait are normal.  DTRs are symmetrical throughout. Sensory, motor and coordinative  functions appear intact. Strength normal and equal bilaterally.   Impression & Recommendations:  Problem # 1:  FULL INCONTINENCE OF FECES (ICD-787.60) Will need referral to GI for scope.  DDX includes but not limited to - Overflow incontinence due to chronic constipation (although did not feel impacted), IBS, Inflammatory bowel disease vs. cauda equina syndrome (but not with significant saddle anesthesia) vs. pelvic floor dysfunction, damage to IAS or EAS during vaginal  delivery/episiotomy, although remote.    Orders: Gastroenterology Referral (GI) MRI with & without Contrast (MRI w&w/o Contrast)  Problem # 2:  COUGH, CHRONIC (ICD-786.2) Will check CXR.  Orders: CXR- 2view (CXR)  Problem # 3:  BACK PAIN, LOW (ICD-724.2) Patient desires Ortho referral and surgery if possible. Will need updated MRI.  If she is a poor surgical candidate, will likely need chronic pain management.  Her updated medication list for this problem includes:    Ultram 50 Mg Tabs (Tramadol hcl) .Marland Kitchen... 1 tablet by mouth three times a day as needed for severe back pain.  handwritten fill on or after 01/24/10, then 5/22    Diclofenac Sodium 75 Mg Tbec (Diclofenac sodium) .Marland Kitchen... 1 tablet by mouth two times a day as needed for pain - take with food  Orders: Orthopedic Referral (Ortho) MRI with & without Contrast (MRI w&w/o Contrast)  Problem # 4:  DEPRESSION, MAJOR, RECURRENT (ICD-296.30) Refill Celexa.  Complete Medication List: 1)  Celexa 40 Mg Tabs (Citalopram hydrobromide) .... Take 1 and 1/2 tablet by mouth daily 2)  Clonazepam 1 Mg Tabs (Clonazepam) .... Take one tablet twice a day as needed for anxiety- written prescriptions given for fill on 01/26/2010 and 02/25/2010 #60 3)  Ultram 50 Mg Tabs (Tramadol hcl) .Marland Kitchen.. 1 tablet by mouth three times a day as needed for severe back pain.  handwritten fill on or after 01/24/10, then 5/22 4)  Diclofenac Sodium 75 Mg Tbec (Diclofenac sodium) .Marland Kitchen.. 1 tablet by mouth two times a day as needed for pain - take with food 5)  Miralax Powd (Polyethylene glycol 3350) .Marland Kitchen.. 1 capful in 8 oz. of clear fluid / juice once or twice daily for constipation - titrate for bm every other day - disp 1 jar 6)  Cetirizine Hcl 10 Mg Tabs (Cetirizine hcl) .Marland Kitchen.. 1 tablet by mouth at bedtime as needed for allergies 7)  Sm Calcium-vitamin D 600-400 Mg-unit Tabs (Calcium carbonate-vitamin d) .... Take two tablets daily Prescriptions: ULTRAM 50 MG  TABS (TRAMADOL HCL) 1  tablet by mouth three times a day as needed for severe back pain.  Handwritten fill on or after 01/24/10, then 5/22  #90 x 0   Entered and Authorized by:   Jettie Pagan MD   Signed by:   Jettie Pagan MD on 03/04/2010   Method used:   [facility]        [facility]  [address] [phone]       [address]       [place], [place]  [address]       Ph: [phone]       Fax: [phone]   RxID:   [id number] CELEXA 40 MG TABS (CITALOPRAM HYDROBROMIDE) Take 1 and 1/2 tablet by mouth daily  #48 Tablet x 4   Entered and Authorized by:   Jettie Pagan MD   Signed by:   Jettie Pagan MD on 03/04/2010   Method used:   [facility]  [address] [id number]       [address]       [place], [place]  [address]       Ph: [phone]       Fax: [phone]   RxID:   [id number]   Appended Document: loss of bowels,tcb

## 2010-11-04 NOTE — Progress Notes (Signed)
Summary: Nuc Pre-Procedure  Phone Note Outgoing Call   Call placed by: Antionette Char RN,  May 14, 2010 2:18 PM Call placed to: Patient Reason for Call: Confirm/change Appt Summary of Call: Left message with information on [employer] Information Sheet (see scanned document for details).     Nuclear Med Background Indications for Stress Test: Evaluation for Ischemia   History: Emphysema  History Comments: Chronic Low BP  History of 3 Suicide Attempts Emphysema per CXR  Symptoms: Chest Tightness, Palpitations, Syncope  Symptoms Comments: Multiple Syncope Episodes   Nuclear Pre-Procedure Cardiac Risk Factors: RBBB, Smoker Height (in): 60.75 Tech Comments: Incomplete RBBB

## 2010-11-04 NOTE — Assessment & Plan Note (Signed)
Summary: Disability form,tcb   Vital Signs:  Patient profile:   55 year old female Menstrual status:  postmenopausal Weight:      103.5 pounds Temp:     99.2 degrees F oral Pulse rate:   76 / minute Pulse rhythm:   regular BP sitting:   92 / 61  (right arm) Cuff size:   regular  Vitals Entered By: Loralee Pacas CMA (November 26, 2009 11:45 AM)  Primary Care Provider:  Delbert Harness MD  CC:  Disability form.  History of Present Illness: 55 yo here for office visit to fill out form and get med refills.  Would like to fill out forms for disability.  She feels her major disability involves low back pain, sciatica, and RLS.  Please see scanned form for full history filled out.  Chronic Pain:  Patient asks for refills of clonazepam and to increase tramadol.  Also intermittantly takes diclofenac for pain.  Continues to have pain in thoracic and lumbar spine, as well as generalized pain.  Anxiety/Depression: Meds reviewed, takes celexa 40-80 mg daily.  Prescribed 60 mg daily.  Uses clonazepam 1 mg two times a day to control symptoms.  Current Medications (verified): 1)  Celexa 40 Mg Tabs (Citalopram Hydrobromide) .... Take 1 and 1/2 Tablet By Mouth Daily 2)  Clonazepam 1 Mg Tabs (Clonazepam) .... Take One Tablet Twice A Day As Needed For Anxiety- Written Prescriptions Given For Fill On 11/28/2008 and 12/26/2008 3)  Ultram 50 Mg  Tabs (Tramadol Hcl) .Marland Kitchen.. 1 Tablet By Mouth Three Times A Day As Needed For Severe Back Pain 4)  Diclofenac Sodium 75 Mg Tbec (Diclofenac Sodium) .Marland Kitchen.. 1 Tablet By Mouth Two Times A Day As Needed For Pain - Take With Food 5)  Miralax  Powd (Polyethylene Glycol 3350) .Marland Kitchen.. 1 Capful in 8 Oz. of Clear Fluid / Juice Once or Twice Daily For Constipation - Titrate For Bm Every Other Day - Disp 1 Jar 6)  Cetirizine Hcl 10 Mg Tabs (Cetirizine Hcl) .Marland Kitchen.. 1 Tablet By Mouth At Bedtime As Needed For Allergies 7)  Sm Calcium-Vitamin D 600-400 Mg-Unit Tabs (Calcium Carbonate-Vitamin  D) .... Take Two Tablets Daily  Allergies: No Known Drug Allergies PMH-FH-SH reviewed for relevance  Review of Systems      See HPI General:  Denies fever and weight loss. MS:  Complains of low back pain, mid back pain, cramps, stiffness, and thoracic pain. Psych:  Complains of anxiety and depression.  Physical Exam  General:  Thin female  A/O x 4. Anxious appearing.  Well groomed. Lungs:  Clear to auscultation bilaterally.  Normal work of breathing.  No wheezes, rales, or rhonchi.  Heart:  Regular rate and rhythm.  No murmur, rub, or gallop.  Extremities:  no LE edema.   Detailed Back/Spine Exam  General:    no obvious scoliosis  Gait:    Normal gait pattern   Palpation:    tenderness to palpation along paraspinal muscles bilaterally worst in thoracic and lumbar spine.  Lumbosacral Exam:  Inspection-deformity:    Normal Palpation-spinal tenderness:  Abnormal     Patient reports low back pain and radiating pain on lying straight leg raise and ocntraleteral straight leg raise.  Poor effort on left straight leg raise due to pain.  Able to get onto table and off table without discomfort.  No pain on passive movement raising of legs.   Impression & Recommendations:  Problem # 1:  BACK PAIN, LOW (ICD-724.2)  filled out Physical  residual functional capacity form for patient while in office today. reviewed historical data on imaging studies fororthopedic workup in 2007.   Will scan today's form into chart before faxing.  Pt no longer taking flexeril due to preference.  Will refill diclofenac, tramadol, and clonazepam at current doses.  The following medications were removed from the medication list:    Flexeril 10 Mg Tabs (Cyclobenzaprine hcl) .Marland Kitchen... 1 tablet by mouth at bedtime as needed for severe back pain Her updated medication list for this problem includes:    Ultram 50 Mg Tabs (Tramadol hcl) .Marland Kitchen... 1 tablet by mouth three times a day as needed for severe back pain     Diclofenac Sodium 75 Mg Tbec (Diclofenac sodium) .Marland Kitchen... 1 tablet by mouth two times a day as needed for pain - take with food  Orders: [facility]- Est  Level 4 (61607)  Problem # 2:  DEPRESSION, MAJOR, RECURRENT (ICD-296.30)  Has been seeing Dr Pascal Lux for therapy.  Patient states she has been taking celexa which she feels helps her.  Patient did not report any insomnia and did not have time to explore further today.    Orders: [facility]- Est  Level 4 (37106)  Problem # 3:  DEMENTIA (ICD-294.8)  Continued unclear etiology for memory problems.  WIll request records from neuropsychiatric evaluation.  Orders: [facility]- Est  Level 4 (99214)  Complete Medication List: 1)  Celexa 40 Mg Tabs (Citalopram hydrobromide) .... Take 1 and 1/2 tablet by mouth daily 2)  Clonazepam 1 Mg Tabs (Clonazepam) .... Take one tablet twice a day as needed for anxiety- written prescriptions given for fill on 11/28/2009 and 12/26/2009 3)  Ultram 50 Mg Tabs (Tramadol hcl) .Marland Kitchen.. 1 tablet by mouth three times a day as needed for severe back pain 4)  Diclofenac Sodium 75 Mg Tbec (Diclofenac sodium) .Marland Kitchen.. 1 tablet by mouth two times a day as needed for pain - take with food 5)  Miralax Powd (Polyethylene glycol 3350) .Marland Kitchen.. 1 capful in 8 oz. of clear fluid / juice once or twice daily for constipation - titrate for bm every other day - disp 1 jar 6)  Cetirizine Hcl 10 Mg Tabs (Cetirizine hcl) .Marland Kitchen.. 1 tablet by mouth at bedtime as needed for allergies 7)  Sm Calcium-vitamin D 600-400 Mg-unit Tabs (Calcium carbonate-vitamin d) .... Take two tablets daily  Patient Instructions: 1)  Follow-up in 2-3 months or sooner if needed. Prescriptions: SM CALCIUM-VITAMIN D 600-400 MG-UNIT TABS (CALCIUM CARBONATE-VITAMIN D) take two tablets daily  #60 x 5   Entered and Authorized by:   Delbert Harness MD   Signed by:   Delbert Harness MD on 11/26/2009   Method used:   Historical   RxID:   [id number] CETIRIZINE HCL 10 MG TABS (CETIRIZINE HCL) 1 tablet by mouth  at bedtime as needed for allergies  #30 x 5   Entered and Authorized by:   Delbert Harness MD   Signed by:   Delbert Harness MD on 11/26/2009   Method used:   Historical   RxID:   [id number] DICLOFENAC SODIUM 75 MG TBEC (DICLOFENAC SODIUM) 1 tablet by mouth two times a day as needed for pain - take with food  #64 x 3   Entered and Authorized by:   Delbert Harness MD   Signed by:   Delbert Harness MD on 11/26/2009   Method used:   Historical   RxID:   [id number] CLONAZEPAM 1 MG TABS (CLONAZEPAM) take one tablet twice a day as needed  for anxiety- written prescriptions given for fill on 11/28/2008 and 12/26/2008  #60 x 1   Entered and Authorized by:   Delbert Harness MD   Signed by:   Delbert Harness MD on 11/26/2009   Method used:   Historical   RxID:   [id number] CELEXA 40 MG TABS (CITALOPRAM HYDROBROMIDE) Take 1 and 1/2 tablet by mouth daily  #48 x 5   Entered and Authorized by:   Delbert Harness MD   Signed by:   Delbert Harness MD on 11/26/2009   Method used:   Historical   RxID:   [id number] ULTRAM 50 MG  TABS (TRAMADOL HCL) 1 tablet by mouth three times a day as needed for severe back pain  #60 x 2   Entered and Authorized by:   Delbert Harness MD   Signed by:   Delbert Harness MD on 11/26/2009   Method used:   [facility]  [address] [phone][facility] (retail[facility]       [address]       [place], [place]  [address]       Ph: [phone]       Fax: [phone]   RxID:   [id number]

## 2010-11-04 NOTE — Progress Notes (Signed)
Summary: phn msg   Phone Note Call from Patient Call back at Work Phone [phone]   Caller: Patient Summary of Call: Pt returning call but not sure who called her. Initial call taken by: Clydell Hakim,  May 12, 2010 2:39 PM  Follow-up for Phone Call        pt called and informed that there were no notes in her chart requesting that she call us back.  I told pt that Dr Pascal Lux has her scheduled for 08.15 for an appt and she felt like maybe that was the call that she was referring to Follow-up by: Loralee Pacas CMA,  May 12, 2010 4:06 PM

## 2010-11-04 NOTE — Progress Notes (Signed)
Summary: phn msg   Phone Note Call from Patient Call back at Home Phone [phone]   Caller: Patient Summary of Call: she spoke with [facility] & they need a copy of all her xrays and mri before they will consider seeing her, needs referral Initial call taken by: De Nurse,  September 01, 2010 9:48 AM  Follow-up for Phone Call        she spoke with Andrey Campanile at [facility] and they need for nurse to call administrator to say why she needs to be reinstated there in order for her to be able to come Follow-up by: De Nurse,  September 01, 2010 11:05 AM  Additional Follow-up for Phone Call Additional follow up Details #1::        please have them contact her orthopedist.  They are the ones who referred her to neurology. Additional Follow-up by: Delbert Harness MD,  September 01, 2010 12:32 PM    Additional Follow-up for Phone Call Additional follow up Details #2::    pt will contact orthopedic office Follow-up by: Jimmy Footman, CMA,  September 01, 2010 4:34 PM

## 2010-11-04 NOTE — Progress Notes (Signed)
Summary: triage   Phone Note Other Incoming Call back at [phone]   Caller: Tonji from [employer] Summary of Call: Wanted to let someone know about a discrepency in pt's meds. Initial call taken by: Clydell Hakim,  April 21, 2010 3:14 PM  Follow-up for Phone Call        has appt this friday. her discharge summary had down to be on citolapram 80mg  once daily. has been taking 60mg  daily. to pcp  klonopin should be 2mg  per dischage summary per Tonji Follow-up by: Golden Circle RN,  April 21, 2010 3:46 PM  Additional Follow-up for Phone Call Additional follow up Details #1::        I have reviewed this at patient's last visit and citalopram 60 daily and Klonopin 1 two times a day is correct. Will again review with patietn at next appt.  Additional Follow-up by: Delbert Harness MD,  April 21, 2010 4:26 PM    Additional Follow-up for Phone Call Additional follow up Details #2::    called Tonji back & told her what md wrote & that they will discuss it at next visit Follow-up by: Golden Circle RN,  April 21, 2010 4:31 PM

## 2010-11-04 NOTE — Consult Note (Signed)
Summary: [facility]  [facility]   Imported By: Clydell Hakim 07/16/2010 09:06:34  _____________________________________________________________________  External Attachment:    Type:   Image     Comment:   External Document
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
## 2010-11-04 NOTE — Progress Notes (Signed)
Summary: triage   Phone Note Call from Patient Call back at [phone]   Caller: Daughter-Miranda Summary of Call: Has tick bite for a couple of months. Said she lost control of bowels today & problem with arm Initial call taken by: Clydell Hakim,  Feb 12, 2010 2:03 PM  Follow-up for Phone Call        took tic off last week. middle of back.  has had bowel incontinence before. has pain. she is not sure if she is taking the diclofenac. told here there are refills & she can call for some. urge mom to take on a regular basis for pain when needed. suggested incontince briefs appt tomorrow at 11am with Dr. Sandi Mealy. pcp is out Follow-up by: Golden Circle RN,  Feb 12, 2010 2:17 PM
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
## 2010-11-04 NOTE — Miscellaneous (Signed)
Summary: walk in   Clinical Lists Changes came ini c/o low back pain & hips hurting. cannot lay on back or sides. brought meds 7 said she did not have anything for pain. I told her tramadol/ultram has been ordered. spouse spoke & said he has seen the bottle at the house. she has not tried any OTC  either. appt made for tomorrow. she agreed with this.Golden Circle RN  January 27, 2010 4:01 PM  Patient has chronic low back pain and has been on tramadol for some time now without any complaints or titration in dose.  I would be reluctant to initiate any new narcotic pain regimen with this patient. Delbert Harness MD  January 28, 2010 9:56 PM

## 2010-11-04 NOTE — Letter (Signed)
Summary: Probation Letter  [facility]  [address]   [place], [place] [address]   Phone: [phone]  Fax: [phone]    06/27/2010  HILDA RYNDERS [address] [place], [place]  [address]  Dear Ms. [facility],  With the goal of better serving all our patients the [facility] is following each patient's missed appointments.  You have missed at least 3 appointments with our practice.If you cannot keep your appointment, we expect you to call at least 24 hours before your appointment time.  Missing appointments prevents other patients from seeing [place] and makes it difficult to provide you with the best possible medical care.      1.   If you miss one more appointment, we will only give you limited medical services. This means we will not call in medication refills, complete a form, or make a referral for you except when you are here for a scheduled office visit.    2.   If you miss 2 or more appointments in the next year, we will dismiss you from our practice.    Our office staff can be reached at [phone] Monday through Friday from 8:30 a.m.-5:00 p.m. and will be glad to schedule your appointment as necessary.    Thank you.   [facility]  Appended Document: Probation Letter Letter returned, no such street, unable to forward.

## 2010-11-04 NOTE — Miscellaneous (Signed)
Summary: questionnaire    rec'd form in mail from pt's case manager- needs form filled out and mailed back to them placed in doctors box De Nurse  May 01, 2010 3:37 PM    Form completed.  Please scan form (not hospital discharge) and mail back to [facility] [employer] c/o [employer] Case Production designer, theatre/television/film.  Placed in to mail pile.  [address] [place] [address] Fax [phone] Delbert Harness MD  May 05, 2010 5:27 PM  Appended Document: questionnaire mailed

## 2010-11-04 NOTE — Letter (Signed)
Summary: Appointment - Missed  [facility], Main Office  [address]   [place], [place] [address]   Phone: [phone]  Fax: [phone]     June 16, 2010 MRN: [id number]   CALIANNA KIM [address] [place], [place]  [address]   Dear Ms. [facility],  Our records indicate you missed your appointment on 06-11-2010  with Dr.  Johney Frame .                                    It is very important that we reach you to reschedule this appointment. We look forward to participating in your health care needs. Please contact us at the number listed above at your earliest convenience to reschedule this appointment.     Sincerely,    Glass blower/designer

## 2010-11-04 NOTE — Progress Notes (Signed)
Summary: NO PAIN MEDS   Phone Note Call from Patient   Caller: Patient Call For: [phone] Summary of Call: Pt calling and wanted to have pain meds.  Will take it up with Asst Director is she doesn't get better response for meds. Initial call taken by: Abundio Miu,  July 09, 2010 12:32 PM  Follow-up for Phone Call        to pcp as she is in clinic today Follow-up by: Golden Circle RN,  July 09, 2010 1:47 PM  Additional Follow-up for Phone Call Additional follow up Details #1::        pt called again about pain meds - is wanting to speak to [facility] and also wants me to send to Stevan Born Additional Follow-up by: De Nurse,  July 09, 2010 2:23 PM    Additional Follow-up for Phone Call Additional follow up Details #2::    pt has called again Follow-up by: De Nurse,  July 09, 2010 4:11 PM  Additional Follow-up for Phone Call Additional follow up Details #3:: Details for Additional Follow-up Action Taken: Patient has had repeated calls and history of inappropriate behavior.  Discussed with Dennison Nancy. Additional Follow-up by: Delbert Harness MD,  July 09, 2010 5:59 PM  pt has called again wanting her pain meds. De Nurse  July 10, 2010 4:55 PM  Spoke with patient earlier this afternoon.  She was mostly asking about the referral to a neurologist.  I told her that it was probably more appropriate for the ortho practice who did her failed back injections to refer her to a neurologist.  Told them I would contact that office for the OV notes and to clarify whether or not they did refer her.  We received the note just now from the [facility] who saw her Alt[employer]e Cabal) and according to it, they contacted [facility].  [facility] dismissed her in 2006 d/t noncompliance.  The PA then referred her to [facility].  He also prescribed her Norco [id number] mg # 60 for pain management.  Patient needs to call [facility] to set up the  appointment.    When I sopke with her earlier I took the opportunity to discuss her multiple no shows and advised her that she was at risk for dismissal.  Please inform patient that she will not be receiving any pain medications from [place] therefore there is no need for her to continue calling for this request.  If she is unhappy with our practice I will be glad to assist her in finding another.   Dennison Nancy RN  July 10, 2010 5:33 PM

## 2010-11-04 NOTE — Progress Notes (Signed)
Summary: Rx Req  Medications Added TRAMADOL HCL 50 MG TABS (TRAMADOL HCL) one tab every 6 hours as needed for pain       Phone Note Call from Patient Call back at Home Phone [phone]   Caller: Patient Summary of Call: Pt saying the lyrica not working and is asking for Tramadol called in.  [facility] [address] Initial call taken by: Clydell Hakim,  July 17, 2010 11:07 AM    New/Updated Medications: TRAMADOL HCL 50 MG TABS (TRAMADOL HCL) one tab every 6 hours as needed for pain Prescriptions: TRAMADOL HCL 50 MG TABS (TRAMADOL HCL) one tab every 6 hours as needed for pain  #60 x 0   Entered and Authorized by:   Delbert Harness MD   Signed by:   Delbert Harness MD on 07/17/2010   Method used:   [facility]  [address] [phone][facility] (retail)       [address]       [place], [place]  [address]       Ph: [phone]       Fax: [phone]   RxID:   [id number]

## 2010-11-04 NOTE — Progress Notes (Signed)
Summary: phn msg   Phone Note Call from Patient Call back at Home Phone [phone]   Caller: Patient Summary of Call: went to [facility] today and they are sched for EEG tomorrow & needs a neuro surgeon referral needs refill on pain meds Initial call taken by: De Nurse,  September 03, 2010 10:12 AM  Follow-up for Phone Call        Please remind [name] that we are no longer prescribing any controlled substances but I am happy to refil her Celexa and lyrica.     She is seeing neurology so she should discuss neurosurgery referral with them if they feel this is indicated.  Any referrals from our office will be discussed during office appointments only. Follow-up by: Delbert Harness MD,  September 03, 2010 11:02 AM  Additional Follow-up for Phone Call Additional follow up Details #1::        gave message to pt and made appt for 12/20. Additional Follow-up by: De Nurse,  September 03, 2010 11:12 AM
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
## 2010-11-06 NOTE — Consult Note (Signed)
Summary: [facility]   Imported By: De Nurse 10/28/2010 16:53:54  _____________________________________________________________________  External Attachment:    Type:   Image     Comment:   External Document
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
## 2010-11-06 NOTE — Progress Notes (Signed)
Summary: phn msg   Phone Note Call from Patient   Summary of Call: pt states that transpotation did not pick her up so she won't be able to make appt- explained the suspension and that she needs to reiterate to transportation her being in jeapardy of being dismissed Initial call taken by: De Nurse,  September 23, 2010 9:08 AM  Follow-up for Phone Call        Documented no show Follow-up by: Jimmy Footman, CMA,  September 23, 2010 11:17 AM

## 2010-11-06 NOTE — Progress Notes (Signed)
Summary: Phn Msg   Phone Note Call from Patient Call back at Home Phone [phone] Call back at [phone]   Reason for Call: Talk to Nurse Summary of Call: pt was referred to [facility], pt is in the middle of re-certifying her medicaid so as of today its inactive. they are refusing to see her & I explained to pt we at [facility] have some leway & could see her without the medicaid but they may have their own policies re: this. Pt thinks we may be able to speak with Dr. Lovell Sheehan & have some influence in getting them to see her, told pt I would send a message to RN to see if they are willing to do this for her.  We can call [phone] to speak with Dr. Lovell Sheehan or call pt at above # to explain we cannot do this.  Initial call taken by: Knox Royalty,  October 17, 2010 9:23 AM  Follow-up for Phone Call        LM for her to call back Follow-up by: Golden Circle RN,  October 17, 2010 9:41 AM  Additional Follow-up for Phone Call Additional follow up Details #1::        told her we cannot force other office to see her w/o insurance or money. she is waiting on medicaid to go thru. she is upset & states she is iin pain. urged her to call them daily to see when it will be in force Additional Follow-up by: Golden Circle RN,  October 17, 2010 9:44 AM    Additional Follow-up for Phone Call Additional follow up Details #2::    Agreed.  Thanks. Follow-up by: Delbert Harness MD,  October 17, 2010 9:52 AM

## 2010-11-06 NOTE — Progress Notes (Signed)
Summary: Rx   Phone Note Refill Request Call back at Home Phone [phone]   pt anxious to have rx clonazepam refilled, pt said its due today & she really needs this. this is already in Dr. Leonie Green box but pt wanted a message sent too.  Initial call taken by: Knox Royalty,  September 17, 2010 9:16 AM  Follow-up for Phone Call        will forward to Dr. Earnest Bailey.  Follow-up by: Rochele Pages RN,  September 17, 2010 10:30 AM  Additional Follow-up for Phone Call Additional follow up Details #1::        Patient is on suspension so she may only have refills during office apopintments.  I have also discussed with patient that I will not longer prescribe her benzos or narcotics as she has violated her controlled substances contract and has gotten valium and naroctics in the ER.   Additional Follow-up by: Delbert Harness MD,  September 17, 2010 3:25 PM    Additional Follow-up for Phone Call Additional follow up Details #2::    advised pt that Dr. Earnest Bailey will no longer be prescribing her narcotics or benzos b/c she violated her contract.  Pt very upset, states she uses clonazepam for restless leg syndrome.  Would like this refilled.  States she is planning to file a malpractice suit against her Dr because she has never been treated this way by any of her doctors.  Also states she is planning to call clinic director.  Advised will let Dr. Earnest Bailey know pt uses clonazepam for restless leg, and she or i will call back. Follow-up by: Rochele Pages RN,  September 17, 2010 4:23 PM  Additional Follow-up for Phone Call Additional follow up Details #3:: Details for Additional Follow-up Action Taken: I am aware of this.  Thanks. Additional Follow-up by: Delbert Harness MD,  September 17, 2010 5:04 PM   Appended Document: Rx Called pt back- advised per Dr. Earnest Bailey she is aware that she uses Klonopin for restless leg, but she will not refill because pt violated her contract.  Advised she can discuss with Dr. Earnest Bailey at  appt 09/23/10.  Pt agreeable.
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
## 2010-11-17 ENCOUNTER — Encounter: Payer: Self-pay | Admitting: *Deleted

## 2010-12-12 ENCOUNTER — Telehealth: Payer: Self-pay | Admitting: Family Medicine

## 2010-12-12 NOTE — Telephone Encounter (Signed)
Needs permanent handicap sticker for her car

## 2010-12-15 LAB — POCT I-STAT, CHEM 8
BUN: 8 mg/dL (ref 6–23)
Calcium, Ion: 1.06 mmol/L — ABNORMAL LOW (ref 1.12–1.32)
Creatinine, Ser: 1 mg/dL (ref 0.4–1.2)
Glucose, Bld: 87 mg/dL (ref 70–99)
Hemoglobin: 13.6 g/dL (ref 12.0–15.0)
TCO2: 27 mmol/L (ref 0–100)

## 2010-12-17 NOTE — Telephone Encounter (Signed)
Requests such as this need to be discussed at an office visit.  Please have patient schedule visit to discuss this form.

## 2010-12-17 NOTE — Telephone Encounter (Signed)
LVM for patient to call back. ?

## 2010-12-18 ENCOUNTER — Telehealth: Payer: Self-pay | Admitting: Family Medicine

## 2010-12-18 LAB — COMPREHENSIVE METABOLIC PANEL
ALT: 21 U/L (ref 0–35)
BUN: 12 mg/dL (ref 6–23)
CO2: 29 mEq/L (ref 19–32)
Calcium: 9.7 mg/dL (ref 8.4–10.5)
Creatinine, Ser: 0.65 mg/dL (ref 0.4–1.2)
GFR calc non Af Amer: >60 mL/min (ref >60)
Glucose, Bld: 120 mg/dL — ABNORMAL HIGH (ref 70–99)
Sodium: 138 mEq/L (ref 135–145)
Total Protein: 7.4 g/dL (ref 6.0–8.3)

## 2010-12-18 LAB — CBC
HCT: 41.3 % (ref 36.0–46.0)
Hemoglobin: 13.9 g/dL (ref 12.0–15.0)
MCH: 30.8 pg (ref 26.0–34.0)
MCHC: 33.7 g/dL (ref 30.0–36.0)
MCV: 91.4 fL (ref 78.0–100.0)
RDW: 16.2 % — ABNORMAL HIGH (ref 11.5–15.5)

## 2010-12-18 LAB — URINALYSIS, ROUTINE W REFLEX MICROSCOPIC
Leukocytes, UA: NEGATIVE
Nitrite: NEGATIVE
Protein, ur: 30 mg/dL — AB
pH: 5.5 (ref 5.0–8.0)

## 2010-12-18 LAB — RAPID URINE DRUG SCREEN, HOSP PERFORMED
Amphetamines: NOT DETECTED
Cocaine: NOT DETECTED
Opiates: POSITIVE — AB
Tetrahydrocannabinol: POSITIVE — AB

## 2010-12-18 LAB — URINE MICROSCOPIC-ADD ON

## 2010-12-18 NOTE — Telephone Encounter (Signed)
Patient's hot flashes are so bad they are keeping her up at night.  I recommended Black Cohosh but she reports that she has been taking it for over a year and it is no longer helping.  Said that it helped at first but but the effect of it wore off after about 6 months.  Told her that was the only recommendation that I had and she would just have to wait until her appt with Dr. Earnest Bailey next week for further advice.

## 2010-12-18 NOTE — Telephone Encounter (Signed)
Spoke with patient and she will make an appointment to discuss getting this form filed out

## 2010-12-18 NOTE — Telephone Encounter (Signed)
Pt asking to speak with RN re: hot flashes, says its keeping her awake at night & has an appt next wed with Briscoe but doesn't know if she can hold out that long, wants to know if there is anything we can do until then?

## 2010-12-19 NOTE — Telephone Encounter (Signed)
Noted. Thanks.

## 2010-12-21 LAB — CARDIAC PANEL(CRET KIN+CKTOT+MB+TROPI)
CK, MB: 4.6 ng/mL — ABNORMAL HIGH (ref 0.3–4.0)
Relative Index: 1 (ref 0.0–2.5)
Relative Index: 1 (ref 0.0–2.5)
Troponin I: 0.01 ng/mL (ref 0.00–0.06)
Troponin I: 0.01 ng/mL (ref 0.00–0.06)

## 2010-12-21 LAB — BASIC METABOLIC PANEL
BUN: 6 mg/dL (ref 6–23)
CO2: 27 mEq/L (ref 19–32)
CO2: 31 mEq/L (ref 19–32)
Chloride: 104 mEq/L (ref 96–112)
Creatinine, Ser: 0.64 mg/dL (ref 0.4–1.2)
GFR calc non Af Amer: >60 mL/min (ref >60)
Glucose, Bld: 126 mg/dL — ABNORMAL HIGH (ref 70–99)
Glucose, Bld: 83 mg/dL (ref 70–99)
Potassium: 3 mEq/L — ABNORMAL LOW (ref 3.5–5.1)
Sodium: 142 mEq/L (ref 135–145)

## 2010-12-21 LAB — CBC
HCT: 35.2 % — ABNORMAL LOW (ref 36.0–46.0)
HCT: 38.5 % (ref 36.0–46.0)
Hemoglobin: 13.1 g/dL (ref 12.0–15.0)
MCH: 30.9 pg (ref 26.0–34.0)
MCH: 31.5 pg (ref 26.0–34.0)
MCH: 31.5 pg (ref 26.0–34.0)
MCHC: 33.9 g/dL (ref 30.0–36.0)
MCHC: 34 g/dL (ref 30.0–36.0)
MCHC: 34.1 g/dL (ref 30.0–36.0)
MCV: 91.3 fL (ref 78.0–100.0)
MCV: 92.5 fL (ref 78.0–100.0)
Platelets: 195 10*3/uL (ref 150–400)
Platelets: 225 10*3/uL (ref 150–400)
RDW: 15 % (ref 11.5–15.5)
RDW: 15.6 % — ABNORMAL HIGH (ref 11.5–15.5)
WBC: 8.8 10*3/uL (ref 4.0–10.5)

## 2010-12-21 LAB — URINALYSIS, ROUTINE W REFLEX MICROSCOPIC
Bilirubin Urine: NEGATIVE
Ketones, ur: 15 mg/dL — AB
Nitrite: NEGATIVE
Urobilinogen, UA: 0.2 mg/dL (ref 0.0–1.0)

## 2010-12-21 LAB — URINE MICROSCOPIC-ADD ON

## 2010-12-21 LAB — DIFFERENTIAL
Basophils Absolute: 0 10*3/uL (ref 0.0–0.1)
Basophils Relative: 0 % (ref 0–1)
Lymphocytes Relative: 17 % (ref 12–46)
Lymphs Abs: 2.2 10*3/uL (ref 0.7–4.0)
Monocytes Absolute: 0.7 10*3/uL (ref 0.1–1.0)
Monocytes Absolute: 0.7 10*3/uL (ref 0.1–1.0)
Monocytes Relative: 8 % (ref 3–12)
Neutro Abs: 5.2 10*3/uL (ref 1.7–7.7)
Neutro Abs: 9 10*3/uL — ABNORMAL HIGH (ref 1.7–7.7)

## 2010-12-21 LAB — URINE CULTURE
Colony Count: NO GROWTH
Culture: NO GROWTH

## 2010-12-21 LAB — COMPREHENSIVE METABOLIC PANEL
Albumin: 3.7 g/dL (ref 3.5–5.2)
BUN: 12 mg/dL (ref 6–23)
Chloride: 105 mEq/L (ref 96–112)
Creatinine, Ser: 0.6 mg/dL (ref 0.4–1.2)
Total Bilirubin: 0.4 mg/dL (ref 0.3–1.2)
Total Protein: 6.5 g/dL (ref 6.0–8.3)

## 2010-12-21 LAB — POCT CARDIAC MARKERS
CKMB, poc: 2.3 ng/mL (ref 1.0–8.0)
CKMB, poc: <1 ng/mL — ABNORMAL LOW (ref 1.0–8.0)
Myoglobin, poc: 290 ng/mL (ref 12–200)
Troponin i, poc: <0.05 ng/mL (ref 0.00–0.09)
Troponin i, poc: <0.05 ng/mL (ref 0.00–0.09)

## 2010-12-21 LAB — POCT PREGNANCY, URINE: Preg Test, Ur: NEGATIVE

## 2010-12-21 LAB — RAPID URINE DRUG SCREEN, HOSP PERFORMED: Cocaine: NOT DETECTED

## 2010-12-21 IMAGING — CT CT HEAD W/O CM
1 of 2 series · 13 of 30 positions shown, 17 images · non-contrast
Comparison: None.

CLINICAL DATA: Possible seizure; confusion.

CT HEAD WITHOUT CONTRAST
TECHNIQUE: Contiguous axial images were obtained from the base of
the skull through the vertex without contrast.

[Series 2: brain · axial · 0.49mm/px · z∈[-144,-4]mm · 13 of 36 slices shown, 17 images]
[im 3/36  brain]
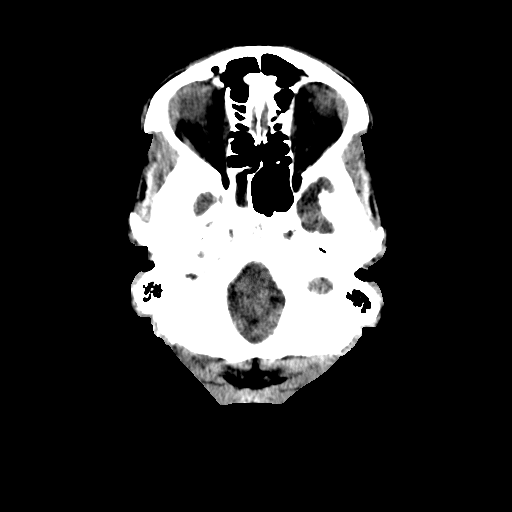
[im 3/36  bone]
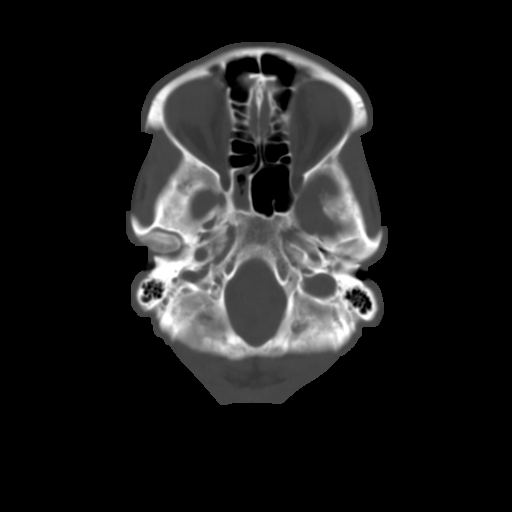
[im 6/36  brain]
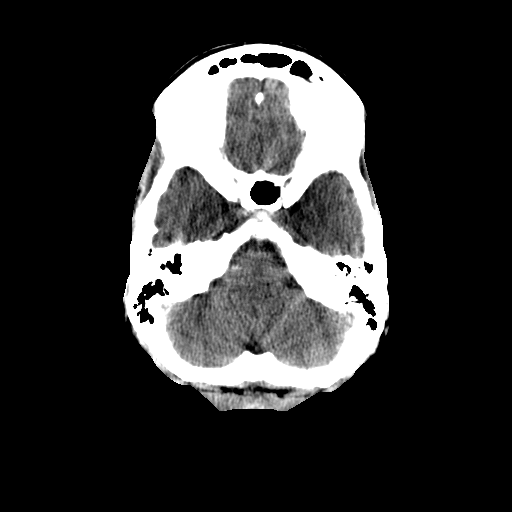
[im 8/36  brain]
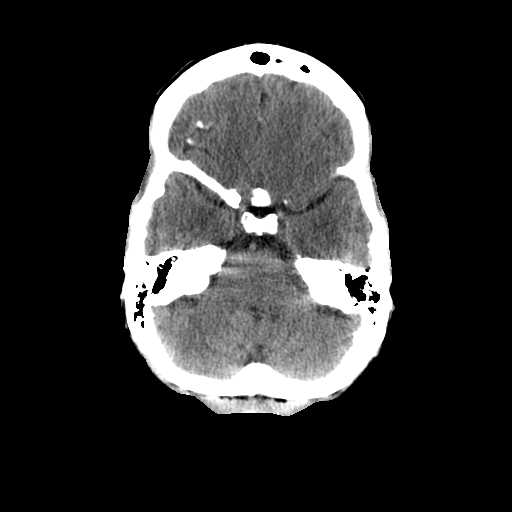
[im 11/36  brain]
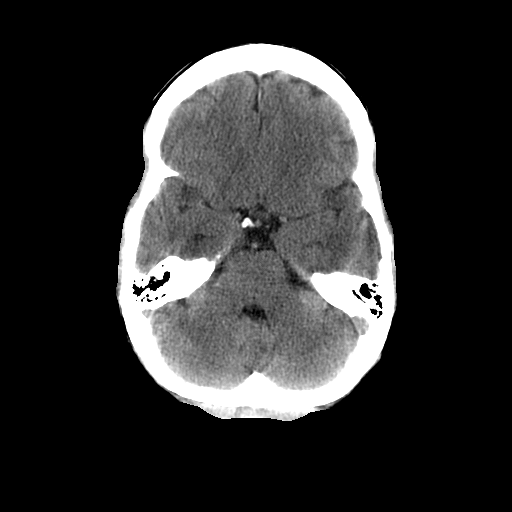
[im 13/36  brain]
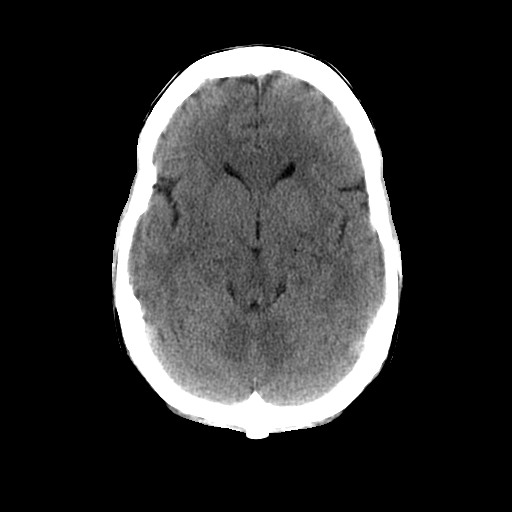
[im 13/36  bone]
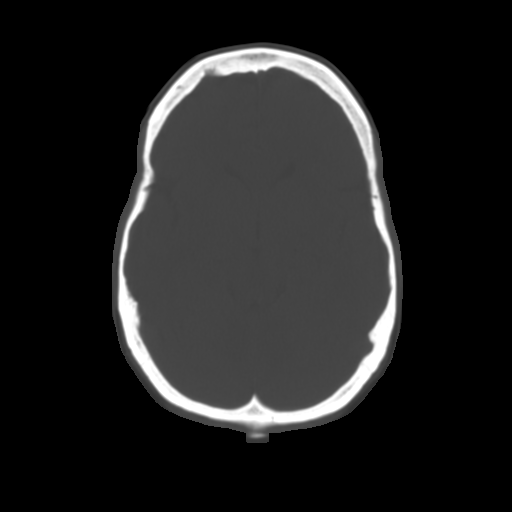
[im 16/36  brain]
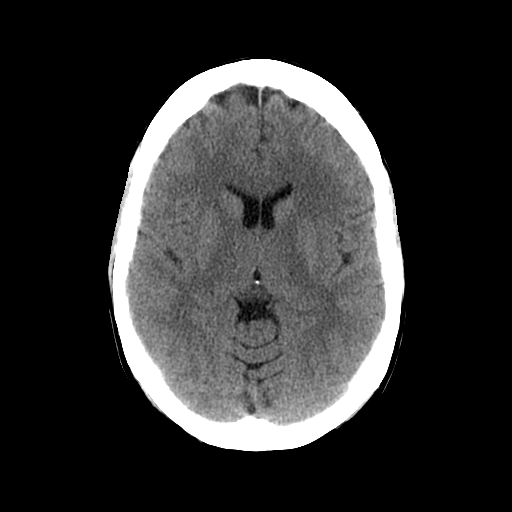
[im 18/36  brain]
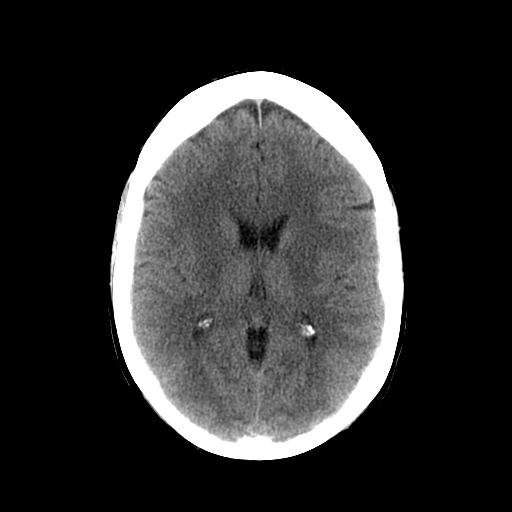
[im 21/36  brain]
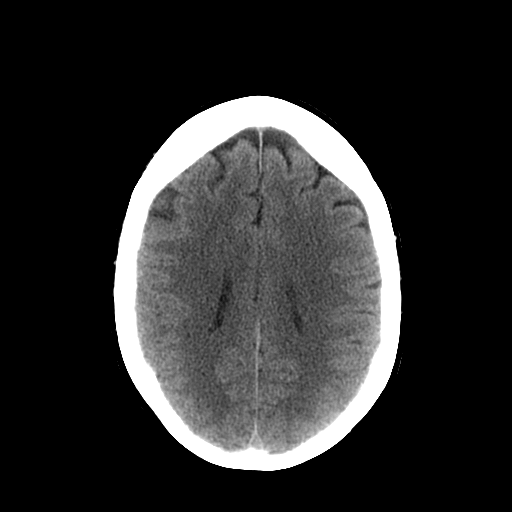
[im 23/36  brain]
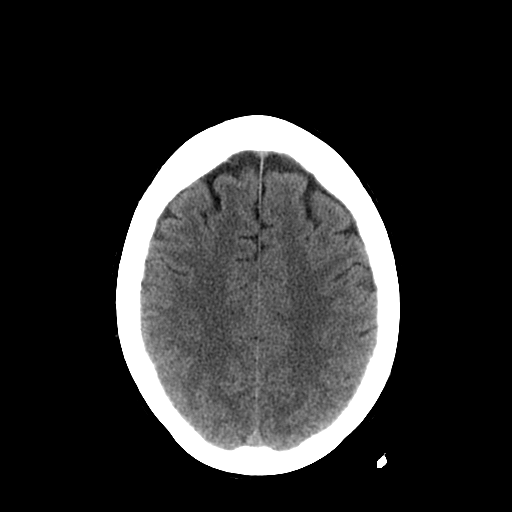
[im 23/36  bone]
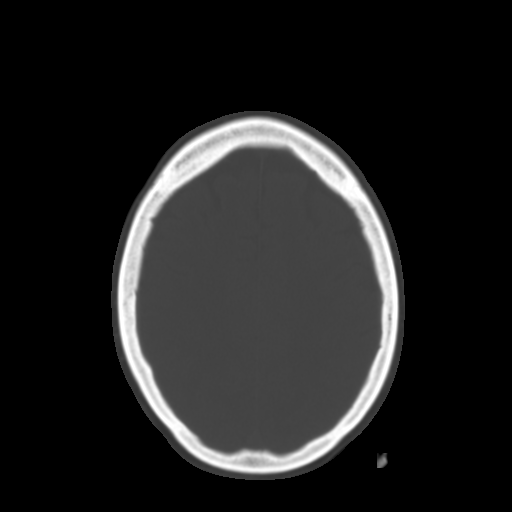
[im 26/36  brain]
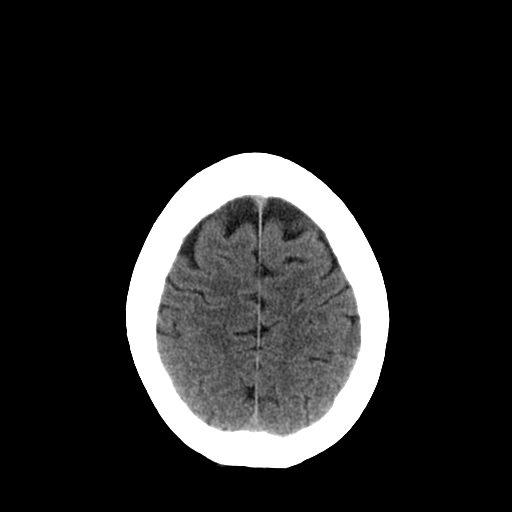
[im 28/36  brain]
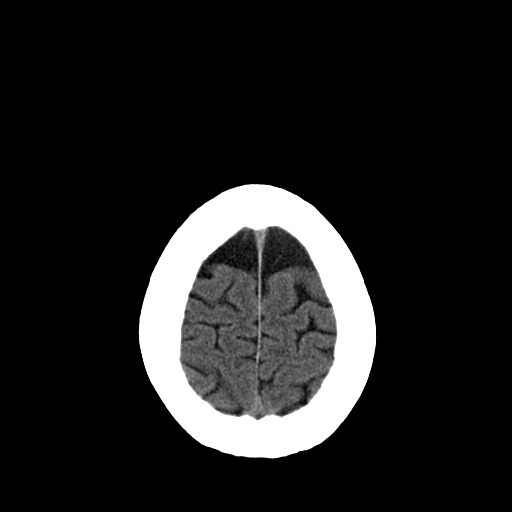
[im 31/36  brain]
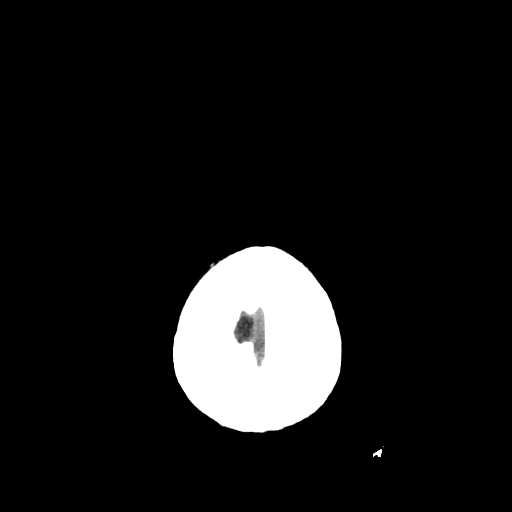
[im 33/36  brain]
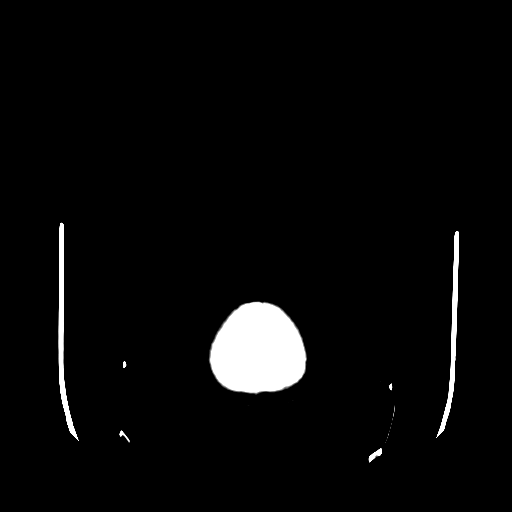
[im 33/36  bone]
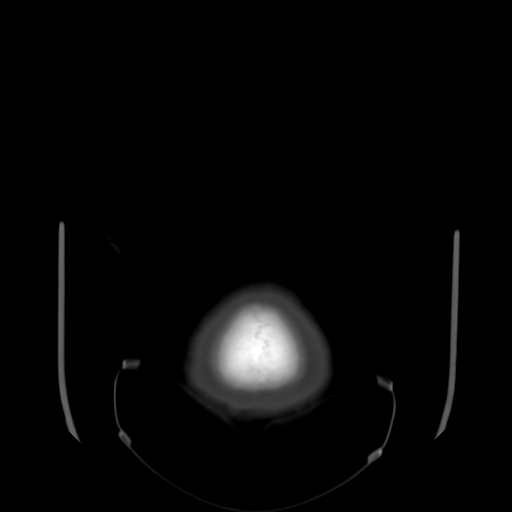

[13 of 30 positions shown; findings below may reference images not displayed]

FINDINGS: There is no evidence of acute infarction, mass lesion, or
intra- or extra-axial hemorrhage on CT.

Mildly prominent Virchow-Robin spaces are noted within the basal
ganglia.  The posterior fossa, including the cerebellum, brainstem
and fourth ventricle, is within normal limits.  The third and
lateral ventricles are unremarkable in appearance.  The cerebral
hemispheres are symmetric in appearance, with normal gray-white
differentiation.  No mass effect or midline shift is seen.

There is no evidence of fracture; visualized osseous structures are
unremarkable in appearance.  The visualized portions of the orbits
are within normal limits.  The paranasal sinuses and mastoid air
cells are well-aerated.  No significant soft tissue abnormalities
are seen.
IMPRESSION: Unremarkable noncontrast CT of the head.

## 2010-12-24 ENCOUNTER — Ambulatory Visit (INDEPENDENT_AMBULATORY_CARE_PROVIDER_SITE_OTHER): Payer: Medicare Other | Admitting: Family Medicine

## 2010-12-24 ENCOUNTER — Encounter: Payer: Self-pay | Admitting: Family Medicine

## 2010-12-24 VITALS — BP 110/68 | Temp 98.1°F | Ht 62.0 in | Wt 106.2 lb

## 2010-12-24 DIAGNOSIS — N951 Menopausal and female climacteric states: Secondary | ICD-10-CM

## 2010-12-24 MED ORDER — GABAPENTIN 300 MG PO CAPS: 300.0000 mg | ORAL_CAPSULE | Freq: Three times a day (TID) | ORAL | Status: DC

## 2010-12-24 NOTE — Patient Instructions (Addendum)
Start with one tablet before bed, then increase to three times a day as needed. It is very important you make an appointment for a gynecological exam at your earliest convenience so we can talk about your vaginal concerns  Menopause tips Get plenty of sleep and rest.  Exercise regularly.  Eat a diet that contains calcium (good for the bones) and soy products (acts like estrogen hormone).  Avoid alcoholic beverages.  Do not smoke.  Taking vitamin E may help in certain cases.  If you have hot flashes, dress in layers.  Take supplements, calcium and vitamin D to strengthen bones.  You can use over-the-counter vaginal cream for vaginal dryness.  Group therapy is sometimes very helpful.

## 2010-12-24 NOTE — Assessment & Plan Note (Addendum)
Will start gabapentin for hot flashes. Follow-up at appt for gynecological exam.

## 2010-12-24 NOTE — Progress Notes (Signed)
  Subjective:    Patient ID: Doris Higgins, female    DOB: 12-19-1955, 55 y.o.   MRN: [id number]  HPI Continued hot flashes for past 5 years.  Patient notes 5 years ago had ovaries removed for concern for mass but was found to be only scar tissue, non malignant.  Was on hormone replacement at that time but self discontinued due to concern about breast cancer with strong family history of breast ca in multiple aunts.  Has been taking black cohosh for the past year with some relief but now worsening.  No weight loss.  Patient requests handicapped parking sticker.  When asked why she states" because they said I was disabled"  When asked to clarify she said the state has declared her disabled due to glaucoma.  When I stated that glaucoma is not a reason for a handicapped parking tag, she states, "what about my back pain then"  Review of Systems See hpi    Objective:   Physical Exam  Constitutional: She appears well-developed and well-nourished.       Period of sweating noted while in office.          Assessment & Plan:

## 2010-12-26 ENCOUNTER — Other Ambulatory Visit: Payer: Self-pay | Admitting: Neurosurgery

## 2010-12-26 DIAGNOSIS — M549 Dorsalgia, unspecified: Secondary | ICD-10-CM

## 2011-01-02 ENCOUNTER — Ambulatory Visit
Admission: RE | Admit: 2011-01-02 | Discharge: 2011-01-02 | Disposition: A | Discharge status: Home / Self Care | Payer: Medicaid Other | Source: Ambulatory Visit | Attending: Neurosurgery | Admitting: Neurosurgery

## 2011-01-02 DIAGNOSIS — M549 Dorsalgia, unspecified: Secondary | ICD-10-CM

## 2011-01-15 LAB — CBC
HCT: 33.2 % — ABNORMAL LOW (ref 36.0–46.0)
MCHC: 34.1 g/dL (ref 30.0–36.0)
MCHC: 35.2 g/dL (ref 30.0–36.0)
MCV: 91.1 fL (ref 78.0–100.0)
Platelets: 186 10*3/uL (ref 150–400)
Platelets: 195 10*3/uL (ref 150–400)
Platelets: 241 10*3/uL (ref 150–400)
RDW: 14.6 % (ref 11.5–15.5)
RDW: 14.8 % (ref 11.5–15.5)
RDW: 14.8 % (ref 11.5–15.5)
WBC: 4.7 10*3/uL (ref 4.0–10.5)

## 2011-01-15 LAB — BASIC METABOLIC PANEL
BUN: 5 mg/dL — ABNORMAL LOW (ref 6–23)
Chloride: 107 mEq/L (ref 96–112)
Creatinine, Ser: 0.53 mg/dL (ref 0.4–1.2)
Glucose, Bld: 102 mg/dL — ABNORMAL HIGH (ref 70–99)
Potassium: 3.1 mEq/L — ABNORMAL LOW (ref 3.5–5.1)

## 2011-01-15 LAB — PROTIME-INR
INR: 1 (ref 0.00–1.49)
Prothrombin Time: 13.2 seconds (ref 11.6–15.2)

## 2011-01-15 LAB — COMPREHENSIVE METABOLIC PANEL
ALT: 23 U/L (ref 0–35)
AST: 37 U/L (ref 0–37)
Albumin: 2.9 g/dL — ABNORMAL LOW (ref 3.5–5.2)
Alkaline Phosphatase: 63 U/L (ref 39–117)
BUN: 3 mg/dL — ABNORMAL LOW (ref 6–23)
Chloride: 107 mEq/L (ref 96–112)
GFR calc Af Amer: >60 mL/min (ref >60)
Potassium: 4.2 mEq/L (ref 3.5–5.1)
Sodium: 140 mEq/L (ref 135–145)
Total Bilirubin: 0.2 mg/dL — ABNORMAL LOW (ref 0.3–1.2)
Total Protein: 5.2 g/dL — ABNORMAL LOW (ref 6.0–8.3)

## 2011-01-15 LAB — RAPID URINE DRUG SCREEN, HOSP PERFORMED
Benzodiazepines: NOT DETECTED
Cocaine: NOT DETECTED
Opiates: POSITIVE — AB

## 2011-01-15 LAB — TYPE AND SCREEN: ABO/RH(D): O POS

## 2011-01-15 LAB — POCT I-STAT, CHEM 8
BUN: 9 mg/dL (ref 6–23)
Calcium, Ion: 1 mmol/L — ABNORMAL LOW (ref 1.12–1.32)
Chloride: 107 meq/L (ref 96–112)
Glucose, Bld: 87 mg/dL (ref 70–99)
HCT: 42 % (ref 36.0–46.0)
Potassium: 3.2 meq/L — ABNORMAL LOW (ref 3.5–5.1)

## 2011-01-15 LAB — ETHANOL: Alcohol, Ethyl (B): <5 mg/dL (ref 0–10)

## 2011-01-15 LAB — ABO/RH: ABO/RH(D): O POS

## 2011-01-15 LAB — TRICYCLICS SCREEN, URINE: TCA Scrn: POSITIVE — AB
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
## 2011-02-20 NOTE — Letter (Signed)
July 06, 2006     [name]  [address]  [place], [place]  [address]   RE:  [name], [name]  MRN:  [id number]  /  DOB:  July 24, 1956   Dear Ms. Cory Roughen,   You were scheduled for an office appointment on May 25, 2006, which you  rescheduled at the last minute.  On appointments on both June 15, 2006  and July 06, 2006, you failed to show.  I will not reschedule you for a  return appointment, since you have demonstrated unreliability in showing up  for your office visits.  Should you wish to follow up with another  gastroenterologist, I will kindly provide you the names of other people in  town.  Please consider this letter a discharge letter from the [facility]  practice.    Sincerely,     Barbette Hair. Arlyce Dice, MD,FACG   RDK/MedQ  DD:  07/06/2006  DT:  07/07/2006  Job #:  [id number]   CC:    Ursula Beath, MD
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
## 2011-02-20 NOTE — Discharge Summary (Signed)
NAMECAASI, GIGLIA NO.:  [id number]   MEDICAL RECORD NO.:  [id number]          PATIENT TYPE:  INP   LOCATION:  9302                          [facility]  [facility]   PHYSICIAN:  Lesly Dukes, M.D. DATE OF BIRTH:  [birth year]   DATE OF ADMISSION:  06/08/2006  DATE OF DISCHARGE:  06/10/2006                                 DISCHARGE SUMMARY   The patient had what was thought to be bilateral hydrosalpinx on ultrasound.  She had had a previous hysterectomy. The patient had a diagnostic  laparoscopy and found both fallopian tubes were densely adhesed to the bowel  and vaginal cuff. At that point, the laparoscopically was abandoned, and she  had an open laparotomy with bilateral salpingo-oophorectomy. There were no  complications in the operation. The patient did well postoperatively. She  was passing gas, ambulating, voiding, and tolerating solid food. She does  have a problem with constipation probably secondary to chronic narcotic use,  and she did have a suppository before she left. The patient was discharged  on June 10, 2006.   DISCHARGE DIET:  No restrictions.   ACTIVITY:  No heavy lifting for 6 weeks. No sexual activity for 2 weeks. No  soaking in tub until wound check in 2 weeks.   DISCHARGE MEDICATIONS:  1. Percocet 5/325 one to two tablets every 4 to 6 hours as needed.  2. Colace 100 mg p.o. b.i.d.  3. Calcium 1500 mg plus 400 mg of vitamin D daily.  4. Resume all prehospital medications.   [facility] for staple  removal.   OTHER DISCHARGE INSTRUCTIONS:  1. If fever, severe abdominal pain, intractable nausea and vomiting      occurs, the patient is to come to the emergency room.  2. Try to switch to Motrin as soon as possible as I believe she uses too      many narcotics.           ______________________________  Lesly Dukes, M.D.     KHL/MEDQ  D:  06/18/2006  T:  06/19/2006  Job:   [id number]

## 2011-02-20 NOTE — Group Therapy Note (Signed)
NAME:  FORTUNE, TOROSIAN.:  [id number]   MEDICAL RECORD NO.:  [id number]          PATIENT TYPE:  WOC   LOCATION:  [facility]                   FACILITY:  [facility]   PHYSICIAN:  Argentina Donovan, MD        DATE OF BIRTH:  [birth year]   DATE OF SERVICE:  07/14/2006                                    CLINIC NOTE   The patient is a 55 year old white female who, 1 month ago, underwent  bilateral salpingo-oophorectomy several years after hysterectomy for pelvic  pain, some pelvic adhesions and hydrosalpinx.  Since that time she had been  healing very, very well, but has had an increase in her significant problem  of insomnia.  She was started on Premarin 1.25 mg daily that has helped to  some degree, but she still wakes up and cannot get back to sleep.  We have  talked to this lady.  She has had it for many years, and I feel that she  probably needs a sleep consult.  In addition to that, she is extremely  focused on her weight loss, which is 1 or 2 pounds from her 5 foot frame,  and she is down to 98 pounds.  She says she has slow digestion.  I am going  to try her on some Eldertonic to see if that will help.  Meanwhile I will  let Dr. Penne Lash know that it probably would be a good idea if she would call  this lady and set her up for a sleep consult for a sleep disorder.           ______________________________  Argentina Donovan, MD     PR/MEDQ  D:  07/14/2006  T:  07/16/2006  Job:  [id number]

## 2011-02-20 NOTE — Group Therapy Note (Signed)
NAME:  Doris Higgins, Doris Higgins NO.:  [id number]   MEDICAL RECORD NO.:  [id number]          PATIENT TYPE:  WOC   LOCATION:  [facility]                   FACILITY:  [facility]   PHYSICIAN:  Argentina Donovan, MD        DATE OF BIRTH:  1956-09-10   DATE OF SERVICE:  03/31/2006                                    CLINIC NOTE   The patient is a 55 year old white female, gravida 3, para 3-0-0-3 with a  history of three vaginal deliveries, who comes in as a referral to Dr.  Penne Lash from the family practice clinic with bilateral ovarian masses that  were found by chance when she had a CT of the abdomen for chronic nausea and  epigastric pain.  She had a total abdominal hysterectomy for large fibroids  in 2000, and the only other surgery she had was a tubal ligation after her  last baby.  She has been currently followed by a gastroenterologist who has  seen her within the next two weeks to complete his workup.  Her CT scan  showed abnormal emptying of the stomach and diverticulosis.  After the CAT  scan and the pelvic masses were identified, she was sent for a pelvic  ultrasound which showed a right-sided hydrosalpinx with free fluid within  the right adnexa and normal right ovary, with a 5-cm complex lesion in the  left adnexa separate from the ovary, consistent with chronic hydrosalpinx or  endometrioma.  Her pelvic symptoms are not significant.  She has episodes of  intermittent diarrhea and constipation and says she has lost 20 pounds over  the past 6-8 months.  She is concerned about cancer, which she says runs in  the family, although the only ones she reported as having cancer in the  family was her father.  She is emotionally labile when told that the surgery  probably would be delayed for some time, but we would go ahead and get it  scheduled with Dr. Penne Lash.  The only medication she has taken is Clonazepam  and Ambien.  She is a slight lady who is 5 feet and weighs 93 pounds, has a  blood pressure 103/62.  Pulse is 67.   DIAGNOSIS:  Pelvic masses.  Schedule for a laparoscopy, probable laparotomy  with Dr. Penne Lash, and I will be assisting.  She has no other serious medical  problems.  She smokes a pack of cigarettes per day, does not drink or take  any other drugs, except those mentioned.  Patient will be in to see Dr.  Penne Lash for a consultation in the week prior to surgery.           ______________________________  Argentina Donovan, MD     PR/MEDQ  D:  03/31/2006  T:  03/31/2006  Job:  [id number]

## 2011-02-20 NOTE — H&P (Signed)
NAME:  Doris Higgins, Doris Higgins NO.:  [id number]   MEDICAL RECORD NO.:  [id number]          PATIENT TYPE:  INP   LOCATION:  [facility]                    [facility]   PHYSICIAN:  Elsie Lincoln, MD      DATE OF BIRTH:  18-Apr-1956   DATE OF SERVICE:                             PRE-OP HISTORY & PHYSICAL   The patient is a 55 year old para 3 female status post hysterectomy in 2000  with chronic pelvic pain.  The patient was noted to have pelvic mass on a CT  done Mar 04, 2006, for abdominal pain and weight loss.  Follow up ultrasound  showed large right sided hydrosalpinx, normal right ovary, and a 5.5 complex  cystic region of the left adnexa which is chronic hydrosalpinx versus  endometrioma.  The patient is extremely worried that she has ovarian cancer.  She did have a CA-125 that was drawn here and was 8.7.  She has no family  history of ovarian cancer.  She has no weight gain.  She does have some  inguinal subcentimeter adenopathy that she believes is due to ovarian cancer  but she was reassured that this is not the area that ovarian cancer  metastasized to.  She is scheduled for a diagnostic laparoscopy and BSO on  June 08, 2006, and she would like this moved up.  She understands the  risks of the procedure including but not limited to bleeding, infection,  damage to intra-abdominal organs particularly the ureter, the bowel, and the  bladder, she also understands that a laparotomy is possible.  She also  understands that there may be stents placed in her ureters and cystoscopy  performed.   PAST MEDICAL HISTORY:  Restless leg syndrome, arthritis of the spine and  back spasms for which she uses a TENS unit.   PAST SURGICAL HISTORY:  Hysterectomy, BTL.   GYN HISTORY:  No history of abnormal Pap smears and positive history of  Trichomonas several years ago.  She is not currently sexually active.   SOCIAL HISTORY:  One pack per day tobacco, no drugs or  alcohol.   FAMILY HISTORY:  One aunt has breast cancer.  Father died of lung cancer.   MEDICATIONS:  Clonazepam, Ambien, Metoclopramide, fexofenadine, and  diclofenac.   ALLERGIES:  Denies.   REVIEW OF SYSTEMS:  Positive for pelvic pain and hot flashes.   PHYSICAL EXAMINATION:  VITAL SIGNS:  Pulse 62, blood pressure 105/64, weight 99.5 pounds, 45.1  kilograms.  GENERAL:  Well developed, well nourished thin female, very pleasant.  HEENT:  Normocephalic, atraumatic.  LUNGS:  Clear to auscultation bilaterally.  HEART:  Regular rate and rhythm.  ABDOMEN:  Soft, nontender, nondistended, no rebound, no guarding, positive  lymphadenopathy subcentimeter in the inguinal region, well healed  hysterectomy scar and umbilical scar from BTL.  GU:  Tanner 5, vagina is slightly atrophic, cuff intact, thickened adnexa on  the right, probably due to hydrosalpinx, uterus surgically absent.   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  1. FNH today since she is menopausal.  2. The patient is scheduled for diagnostic laparoscopy and hopefully BSO.  The patient understands that if we can get all abnormal pathology out      either via laparoscopy or laparotomy, however, if one of the ovaries      looks completely normal and is stuck to the pelvic sidewall, we will      leave this behind.  3. Pap smear done today since we did not know if she had any dysplasia on      her hysterectomy.  4. Surgery in September.           ______________________________  Elsie Lincoln, MD     KL/MEDQ  D:  05/12/2006  T:  05/12/2006  Job:  [id number]

## 2011-02-20 NOTE — Op Note (Signed)
NAME:  Doris Higgins, Doris Higgins NO.:  [id number]   MEDICAL RECORD NO.:  [id number]          PATIENT TYPE:  AMB   LOCATION:  [facility]                           [facility]   PHYSICIAN:  Lesly Dukes, M.D. DATE OF BIRTH:  01-09-1956   DATE OF PROCEDURE:  06/08/2006  DATE OF DISCHARGE:                                 OPERATIVE REPORT   PREOPERATIVE DIAGNOSIS:  55 year old female with bilateral adnexal masses  and pelvic pain.   POSTOPERATIVE DIAGNOSIS:  55 year old female with bilateral hydrosalpinx,  grossly normal ovaries and pelvic adhesions.   PROCEDURE PERFORMED:  Diagnostic laparoscopy, exploratory laparotomy,  bilateral salpingectomy and lysis of adhesions.   SURGEON:  Dr. Elsie Lincoln.   ASSISTANT:  Dr. Blima Rich.   ANESTHESIA:  General.   PATHOLOGY:  Bilateral fallopian tubes and ovaries.   ESTIMATED BLOOD LOSS:  150 ml.   COMPLICATIONS:  None.   PROCEDURE:  After an informed consent was obtained, the patient was taken to  the operating room where general anesthesia was induced.  The patient was  placed in the dorsal-lithotomy position, prepped and draped in a normal  sterile fashion.  A Foley was in the bladder.  Using the open method, the  laparoscope was introduced with the umbilical skin incision.  It was noted  that there were bowel adhesions, bladder adhesions and bilateral masses that  were large enough that would be very difficult to definitively remove via  laparoscopy. Laparoscopy was abandoned.  The fascia was closed with 0 Vicryl  and the skin was closed with 4-0 Vicryl in a subcuticular fashion.  The  Pfannenstiel skin incision was then made and carried down to the fascia.  The fascia was incised in the midline and extended bilaterally.  The rectus  muscles were separated and the fascia was removed from the rectus muscle.  The rectus muscles were separated in the midline.  The peritoneum was  entered bluntly and extended superiorly  and inferiorly with good  visualization of the bladder.  The patient was placed in the Trendelenburg  and a Balfour retractor was placed into the abdomen.  The bowel was packed  away with moist laparotomy pads.  Lysis of adhesions was performed involving  the bowel and the left ovary.  Good hemostasis was noted.  Each pelvic side  wall was then entered and the ureter was identified.  Each infundibulopelvic  ligament was then clamped, transected and suture ligated with 0 Vicryl x2.  Good hemostasis was noted.  There were extensive adhesions involving both  fallopian tubes and hydrosalpinx to the bladder reflection and vaginal cuff.  These were taken down sharply and bluntly with good hemostasis.  Both  ovaries and fallopian tubes were then removed without damage to the  surrounding organs.  There was good hemostasis.  The bladder was retrograde  filled to make sure that the bladder was intact and it was.  The rectum was  noted to be intact.  The abdomen was copiously irrigated with saline and all  pedicles were noted to be hemostatic.  All instruments were removed from the  patient's abdomen and counts were correct.  The fascia was closed with 0  Vicryl in a running fashion.  The subcutaneous tissue was copiously  irrigated and found to be hemostatic.  The skin was closed with staples.   The patient tolerated the procedure well.  Sponge, lap, and instrument and  needle counts were correct x2.  The patient was taken to the recovery room  in stable condition.           ______________________________  Lesly Dukes, M.D.     KHL/MEDQ  D:  06/08/2006  T:  06/08/2006  Job:  [id number]

## 2011-04-17 ENCOUNTER — Ambulatory Visit: Payer: Medicaid Other | Admitting: Physical Medicine and Rehabilitation

## 2011-04-27 ENCOUNTER — Encounter
Payer: Medicare Other | Attending: Physical Medicine and Rehabilitation | Admitting: Physical Medicine and Rehabilitation

## 2011-04-30 ENCOUNTER — Encounter: Payer: Self-pay | Admitting: Family Medicine

## 2011-09-14 ENCOUNTER — Other Ambulatory Visit: Payer: Self-pay | Admitting: Family Medicine

## 2011-09-14 NOTE — Telephone Encounter (Signed)
Refill request

## 2013-01-02 ENCOUNTER — Emergency Department (HOSPITAL_COMMUNITY)
Admission: EM | Admit: 2013-01-02 | Discharge: 2013-01-02 | Disposition: A | Discharge status: Home / Self Care | Payer: Medicare Other | Attending: Emergency Medicine | Admitting: Emergency Medicine

## 2013-01-02 DIAGNOSIS — G8929 Other chronic pain: Secondary | ICD-10-CM | POA: Insufficient documentation

## 2013-01-02 DIAGNOSIS — M549 Dorsalgia, unspecified: Secondary | ICD-10-CM | POA: Insufficient documentation

## 2013-01-02 DIAGNOSIS — F17200 Nicotine dependence, unspecified, uncomplicated: Secondary | ICD-10-CM | POA: Insufficient documentation

## 2013-01-02 DIAGNOSIS — F411 Generalized anxiety disorder: Secondary | ICD-10-CM | POA: Insufficient documentation

## 2013-01-02 DIAGNOSIS — Z79899 Other long term (current) drug therapy: Secondary | ICD-10-CM | POA: Insufficient documentation

## 2013-01-02 DIAGNOSIS — F489 Nonpsychotic mental disorder, unspecified: Secondary | ICD-10-CM | POA: Insufficient documentation

## 2013-01-02 MED ORDER — DIAZEPAM 5 MG/ML IJ SOLN
5.0000 mg | Freq: Once | INTRAMUSCULAR | Status: AC
  Administered 2013-01-02: 5 mg via INTRAMUSCULAR
  Filled 2013-01-02: qty 2

## 2013-01-02 MED ORDER — TRAMADOL HCL 50 MG PO TABS: 50.0000 mg | ORAL_TABLET | Freq: Four times a day (QID) | ORAL | Status: DC | PRN

## 2013-01-02 MED ORDER — DIAZEPAM 5 MG PO TABS: 5.0000 mg | ORAL_TABLET | Freq: Two times a day (BID) | ORAL | Status: DC

## 2013-01-02 NOTE — ED Provider Notes (Signed)
History     CSN: [id number]  Arrival date & time 01/02/13  4098   First MD Initiated Contact with Patient 01/02/13 [phone]      No chief complaint on file.   (Consider location/radiation/quality/duration/timing/severity/associated sxs/prior treatment) HPI Comments: Patient with a history of chronic back pain presents today with back pain.  Back pain located of her "entire back."  She describes the pain as muscle spasms.  Pain has been present for the past week, but worse last evening.  No acute injury or trauma.  She denies any heavy lifting.  She has not taken anything for symptoms prior to arrival.  Pain worse with movement.  No fever or chills.  Denies prior history of IVDU or Cancer.  Patient is a 57 y.o. female presenting with back pain. The history is provided by the patient.  Back Pain Location:  Generalized Radiates to:  Does not radiate Progression:  Worsening Context: not lifting heavy objects and not twisting   Associated symptoms: no bladder incontinence, no bowel incontinence, no fever, no leg pain, no numbness, no paresthesias, no perianal numbness, no tingling and no weakness   Risk factors: no hx of cancer     Past Medical History  Diagnosis Date  . Anxiety   . Suicide and self-inflicted injury   . Menopause   . Chronic back pain     Past Surgical History  Procedure Laterality Date  . Abdominal hysterectomy      2000 and oophorectonmy 2007 for mass which was found to be non -malignant per patient report  . Tubal ligation      Family History  Problem Relation Age of Onset  . Alcohol abuse Mother   . Depression Mother   . Alcohol abuse Father   . Cancer Father     lung ca  . Cancer Maternal Aunt     breast  . Cancer Paternal Aunt     breast    History  Substance Use Topics  . Smoking status: Current Every Day Smoker  . Smokeless tobacco: Not on file  . Alcohol Use: Not on file    OB History   Grav Para Term Preterm Abortions TAB SAB Ect Mult  Living                  Review of Systems  Constitutional: Negative for fever and chills.  HENT: Negative for neck pain and neck stiffness.   Gastrointestinal: Negative for bowel incontinence.  Genitourinary: Negative for bladder incontinence.       No bowel or bladder incontinence No urinary retention  Musculoskeletal: Positive for back pain.  Skin: Negative for color change and rash.  Neurological: Negative for tingling, weakness, numbness and paresthesias.  All other systems reviewed and are negative.    Allergies  Review of patient's allergies indicates no known allergies.  Home Medications   Current Outpatient Rx  Name  Route  Sig  Dispense  Refill  . citalopram (CELEXA) 40 MG tablet   Oral   Take 40 mg by mouth daily.           . cycloSPORINE (RESTASIS) 0.05 % ophthalmic emulsion   Both Eyes   Place 1 drop into both eyes 2 (two) times daily.         Marland Kitchen gabapentin (NEURONTIN) 300 MG capsule   Oral   Take 300 mg by mouth 3 (three) times daily.         Marland Kitchen PRESCRIPTION MEDICATION   Both Eyes  Place 1 drop into both eyes at bedtime. Prescription eyedrop for glaucoma           BP 126/74  Pulse 91  Temp(Src) 98.8 F (37.1 C) (Oral)  SpO2 100%  Physical Exam  Nursing note and vitals reviewed. Constitutional: She is oriented to person, place, and time. She appears well-developed and well-nourished. No distress.  HENT:  Head: Normocephalic and atraumatic.  Mouth/Throat: Oropharynx is clear and moist.  Neck: Normal range of motion and full passive range of motion without pain. Neck supple. No spinous process tenderness and no muscular tenderness present. No rigidity. Normal range of motion present.  Cardiovascular: Normal rate, regular rhythm, normal heart sounds and intact distal pulses.   Pulmonary/Chest: Effort normal and breath sounds normal. No respiratory distress. She has no wheezes. She has no rales. She exhibits no tenderness.  Musculoskeletal:  Normal range of motion.       Cervical back: She exhibits normal range of motion, no tenderness, no bony tenderness and no pain.       Thoracic back: She exhibits no tenderness, no bony tenderness and no pain.       Lumbar back: She exhibits tenderness, bony tenderness and pain. She exhibits no spasm and normal pulse.       Right foot: She exhibits no swelling.       Left foot: She exhibits no swelling.  Bilateral lower extremities nontender without color change, baseline range of motion of extremities with intact distal pulses, capillary refill less than 2 seconds bilaterally.  Pt has increased pain w ROM of lumbar spine. Pain w ambulation, no sign of ataxia.  Neurological: She is alert and oriented to person, place, and time. She has normal strength. No sensory deficit. Gait (no ataxia, slowed and hunched d/t pain ) abnormal.  Reflex Scores:      Patellar reflexes are 2+ on the right side and 2+ on the left side.      Achilles reflexes are 2+ on the right side and 2+ on the left side. Sensation at baseline for light touch in all 4 distal extremities, motor symmetric & bilateral 5/5 (hips: abduction, adduction, flexion; knee: flexion & extension; foot: dorsiflexion, plantar flexion, toes: dorsi flexion) Patellar & ankle reflexes intact.   Skin: Skin is warm and dry. No rash noted. She is not diaphoretic. No erythema. No pallor.  Psychiatric: She has a normal mood and affect.    ED Course  Procedures (including critical care time)  Labs Reviewed - No data to display No results found.   No diagnosis found.  10:14 AM Patient reports that her back spasms are improving at this time.  MDM  Patient with back pain.  No neurological deficits and normal neuro exam.  Patient can walk but states is painful.  No loss of bowel or bladder control.  No concern for cauda equina.  No fever, night sweats, weight loss, h/o cancer, IVDU.  RICE protocol and pain medicine indicated and discussed with patient.          Pascal Lux Garrett, PA-C 01/02/13 1026

## 2013-01-02 NOTE — ED Provider Notes (Signed)
Medical screening examination/treatment/procedure(s) were performed by non-physician practitioner and as supervising physician I was immediately available for consultation/collaboration.  Doug Sou, MD 01/02/13 [phone]

## 2013-01-02 NOTE — ED Notes (Addendum)
"  back spasms" x 1 week, "from shoulders to low back". Has had several steroid injections to back and shoulders. PCP is [facility]. Denies numbness or tingling. Pt tearful.

## 2013-01-02 NOTE — ED Notes (Signed)
Pt continues to be tearful

## 2013-03-10 ENCOUNTER — Emergency Department (HOSPITAL_COMMUNITY)
Admission: EM | Admit: 2013-03-10 | Discharge: 2013-03-10 | Disposition: A | Discharge status: Home / Self Care | Payer: Medicare Other | Attending: Emergency Medicine | Admitting: Emergency Medicine

## 2013-03-10 ENCOUNTER — Encounter (HOSPITAL_COMMUNITY): Payer: Self-pay | Admitting: Family Medicine

## 2013-03-10 ENCOUNTER — Emergency Department (HOSPITAL_COMMUNITY): Payer: Medicare Other

## 2013-03-10 DIAGNOSIS — W503XXA Accidental bite by another person, initial encounter: Secondary | ICD-10-CM | POA: Insufficient documentation

## 2013-03-10 DIAGNOSIS — Y9389 Activity, other specified: Secondary | ICD-10-CM | POA: Insufficient documentation

## 2013-03-10 DIAGNOSIS — F17200 Nicotine dependence, unspecified, uncomplicated: Secondary | ICD-10-CM | POA: Insufficient documentation

## 2013-03-10 DIAGNOSIS — R51 Headache: Secondary | ICD-10-CM | POA: Insufficient documentation

## 2013-03-10 DIAGNOSIS — Z8742 Personal history of other diseases of the female genital tract: Secondary | ICD-10-CM | POA: Insufficient documentation

## 2013-03-10 DIAGNOSIS — S01502A Unspecified open wound of oral cavity, initial encounter: Secondary | ICD-10-CM | POA: Insufficient documentation

## 2013-03-10 DIAGNOSIS — M549 Dorsalgia, unspecified: Secondary | ICD-10-CM | POA: Insufficient documentation

## 2013-03-10 DIAGNOSIS — G40909 Epilepsy, unspecified, not intractable, without status epilepticus: Secondary | ICD-10-CM | POA: Insufficient documentation

## 2013-03-10 DIAGNOSIS — F411 Generalized anxiety disorder: Secondary | ICD-10-CM | POA: Insufficient documentation

## 2013-03-10 DIAGNOSIS — Y92009 Unspecified place in unspecified non-institutional (private) residence as the place of occurrence of the external cause: Secondary | ICD-10-CM | POA: Insufficient documentation

## 2013-03-10 DIAGNOSIS — M129 Arthropathy, unspecified: Secondary | ICD-10-CM | POA: Insufficient documentation

## 2013-03-10 DIAGNOSIS — G8929 Other chronic pain: Secondary | ICD-10-CM | POA: Insufficient documentation

## 2013-03-10 DIAGNOSIS — Z79899 Other long term (current) drug therapy: Secondary | ICD-10-CM | POA: Insufficient documentation

## 2013-03-10 DIAGNOSIS — R569 Unspecified convulsions: Secondary | ICD-10-CM

## 2013-03-10 HISTORY — DX: Unspecified osteoarthritis, unspecified site: M19.90

## 2013-03-10 LAB — COMPREHENSIVE METABOLIC PANEL
ALT: 11 U/L (ref 0–35)
AST: 17 U/L (ref 0–37)
Alkaline Phosphatase: 83 U/L (ref 39–117)
CO2: 32 mEq/L (ref 19–32)
Chloride: 102 mEq/L (ref 96–112)
GFR calc Af Amer: >90 mL/min (ref >90)
GFR calc non Af Amer: >90 mL/min (ref >90)
Glucose, Bld: 80 mg/dL (ref 70–99)
Sodium: 141 mEq/L (ref 135–145)
Total Bilirubin: 0.2 mg/dL — ABNORMAL LOW (ref 0.3–1.2)

## 2013-03-10 LAB — CBC WITH DIFFERENTIAL/PLATELET
Basophils Absolute: 0 10*3/uL (ref 0.0–0.1)
HCT: 39.7 % (ref 36.0–46.0)
Lymphocytes Relative: 37 % (ref 12–46)
Lymphs Abs: 3 10*3/uL (ref 0.7–4.0)
MCV: 90 fL (ref 78.0–100.0)
Neutro Abs: 4.2 10*3/uL (ref 1.7–7.7)
Platelets: 289 10*3/uL (ref 150–400)
RBC: 4.41 MIL/uL (ref 3.87–5.11)
RDW: 14.2 % (ref 11.5–15.5)
WBC: 8.2 10*3/uL (ref 4.0–10.5)

## 2013-03-10 MED ORDER — ACETAMINOPHEN 325 MG PO TABS
650.0000 mg | ORAL_TABLET | Freq: Once | ORAL | Status: AC
  Administered 2013-03-10: 650 mg via ORAL
  Filled 2013-03-10: qty 2

## 2013-03-10 MED ORDER — LEVETIRACETAM 500 MG PO TABS
500.0000 mg | ORAL_TABLET | Freq: Once | ORAL | Status: AC
  Administered 2013-03-10: 500 mg via ORAL
  Filled 2013-03-10: qty 1

## 2013-03-10 MED ORDER — NAPROXEN 500 MG PO TABS: 500.0000 mg | ORAL_TABLET | Freq: Two times a day (BID) | ORAL | Status: DC

## 2013-03-10 MED ORDER — LEVETIRACETAM 500 MG PO TABS: 500.0000 mg | ORAL_TABLET | Freq: Two times a day (BID) | ORAL | Status: DC

## 2013-03-10 NOTE — ED Notes (Signed)
Patient transported to CT 

## 2013-03-10 NOTE — ED Provider Notes (Signed)
History     CSN: [id number]  Arrival date & time 03/10/13  1007   First MD Initiated Contact with Patient 03/10/13 1011      Chief Complaint  Patient presents with  . Seizures    (Consider location/radiation/quality/duration/timing/severity/associated sxs/prior treatment) HPI Comments: 57 year old female with a history of seizures several years ago who presents with a complaint of recurrent seizure. The patient states that she had been worked up for seizures in the past without any definite etiology found, has been unmedicated for over 18 months without any seizures and has been doing very well. She states that just prior to the seizure this morning she told her family member that she was feeling abnormal in the head but this was not a headache, it is not dizziness it was just an abnormal feeling which is poorly described. She then had a witnessed tonic-clonic seizure lasting less than 1 minute and associated with tongue biting, postictal phase as witnessed by EMS. The patient states that this time she does have a headache, she admits to having chronic headaches, has not had fevers chills nausea vomiting weakness or numbness. Her vision is normal but she does have photophobia with her headache and has chronic headaches similar to this. She denies alcohol intake, denies any other new medications.  Patient is a 57 y.o. female presenting with seizures. The history is provided by the patient and the EMS personnel.  Seizures   Past Medical History  Diagnosis Date  . Anxiety   . Suicide and self-inflicted injury   . Menopause   . Chronic back pain   . Arthritis     Past Surgical History  Procedure Laterality Date  . Abdominal hysterectomy      2000 and oophorectonmy 2007 for mass which was found to be non -malignant per patient report  . Tubal ligation      Family History  Problem Relation Age of Onset  . Alcohol abuse Mother   . Depression Mother   . Alcohol abuse Father   .  Cancer Father     lung ca  . Cancer Maternal Aunt     breast  . Cancer Paternal Aunt     breast    History  Substance Use Topics  . Smoking status: Current Every Day Smoker  . Smokeless tobacco: Not on file  . Alcohol Use: No    OB History   Grav Para Term Preterm Abortions TAB SAB Ect Mult Living                  Review of Systems  Neurological: Positive for seizures.  All other systems reviewed and are negative.    Allergies  Review of patient's allergies indicates no known allergies.  Home Medications   Current Outpatient Rx  Name  Route  Sig  Dispense  Refill  . citalopram (CELEXA) 40 MG tablet   Oral   Take 40 mg by mouth daily.           . cycloSPORINE (RESTASIS) 0.05 % ophthalmic emulsion   Both Eyes   Place 1 drop into both eyes 2 (two) times daily.         Marland Kitchen gabapentin (NEURONTIN) 300 MG capsule   Oral   Take 300 mg by mouth 3 (three) times daily.         Marland Kitchen latanoprost (XALATAN) 0.005 % ophthalmic solution   Both Eyes   Place 1 drop into both eyes at bedtime.         Marland Kitchen  Tetrahydrozoline HCl (OPTI-CLEAR OP)   Ophthalmic   Apply 1 drop to eye daily.         Marland Kitchen tiZANidine (ZANAFLEX) 4 MG tablet   Oral   Take 4 mg by mouth at bedtime as needed. For spasms         . traMADol (ULTRAM) 50 MG tablet   Oral   Take 1 tablet (50 mg total) by mouth every 6 (six) hours as needed for pain.   15 tablet   0   . levETIRAcetam (KEPPRA) 500 MG tablet   Oral   Take 1 tablet (500 mg total) by mouth every 12 (twelve) hours.   60 tablet   1   . naproxen (NAPROSYN) 500 MG tablet   Oral   Take 1 tablet (500 mg total) by mouth 2 (two) times daily with a meal.   30 tablet   0     BP 125/74  Pulse 71  Temp(Src) 98.3 F (36.8 C)  Resp 18  SpO2 97%  Physical Exam  Nursing note and vitals reviewed. Constitutional: She appears well-developed and well-nourished. No distress.  HENT:  Head: Normocephalic and atraumatic.  Mouth/Throat:  Oropharynx is clear and moist. No oropharyngeal exudate.  Left-sided the tongue with 2 small bite marks, no lacerations  Eyes: Conjunctivae and EOM are normal. Pupils are equal, round, and reactive to light. Right eye exhibits no discharge. Left eye exhibits no discharge. No scleral icterus.  Neck: Normal range of motion. Neck supple. No JVD present. No thyromegaly present.  Cardiovascular: Normal rate, regular rhythm, normal heart sounds and intact distal pulses.  Exam reveals no gallop and no friction rub.   No murmur heard. Pulmonary/Chest: Effort normal and breath sounds normal. No respiratory distress. She has no wheezes. She has no rales.  Abdominal: Soft. Bowel sounds are normal. She exhibits no distension and no mass. There is no tenderness.  Musculoskeletal: Normal range of motion. She exhibits no edema and no tenderness.  Lymphadenopathy:    She has no cervical adenopathy.  Neurological: She is alert. Coordination normal.  Speech is clear, strength is normal in all 470s, sensation to light touch is normal in all 4 extremities, cranial nerves III through XII are intact, pupillary exam is normal, there is no conjunctival injection, there is no ptosis. No ataxia.  Skin: Skin is warm and dry. No rash noted. No erythema.  Psychiatric: She has a normal mood and affect. Her behavior is normal.    ED Course  Procedures (including critical care time)  Labs Reviewed  COMPREHENSIVE METABOLIC PANEL - Abnormal; Notable for the following:    BUN 5 (*)    Total Bilirubin 0.2 (*)    All other components within normal limits  CBC WITH DIFFERENTIAL   Ct Head Wo Contrast  03/10/2013   *RADIOLOGY REPORT*  Clinical Data: Seizure.  CT HEAD WITHOUT CONTRAST  Technique:  Contiguous axial images were obtained from the base of the skull through the vertex without contrast.  Comparison: 07/14/2010  Findings: The brain demonstrates no evidence of hemorrhage, infarction, edema, mass effect, extra-axial fluid  collection, hydrocephalus or mass lesion.  The skull is unremarkable.  IMPRESSION: Normal head CT.   Original Report Authenticated By: Irish Lack, M.D.     1. Seizure       MDM  The patient has a benign exam at this time, she does have evidence of tongue biting but no urinary incontinence, likely had a recurrent seizure but at this time appear  stable. We'll give Her, CT scan of the head labs, reevaluate.   The patient has had a normal CT scan, normal lab work, no further seizures in her headache has improved with Tylenol. At this time I discussed with the patient all of her findings including a CT scan of the indications for followup, the necessity to followup with neurology this week in the indications for starting medications. She has agreed to start Keppra, she appears stable for discharge and has been able to explain to me of the indications for return. She appears hemodynamically and neurologically stable for discharge.   Meds given in ED:  Medications  levETIRAcetam (KEPPRA) tablet 500 mg (500 mg Oral Given 03/10/13 1115)  acetaminophen (TYLENOL) tablet 650 mg (650 mg Oral Given 03/10/13 1115)    New Prescriptions   LEVETIRACETAM (KEPPRA) 500 MG TABLET    Take 1 tablet (500 mg total) by mouth every 12 (twelve) hours.   NAPROXEN (NAPROSYN) 500 MG TABLET    Take 1 tablet (500 mg total) by mouth 2 (two) times daily with a meal.         Vida Roller, MD 03/10/13 1149

## 2013-03-10 NOTE — ED Notes (Signed)
Per EMS, pt was at home this am and had 20 sec seizure. Last one was 1 year ago and doesn't take any meds. CBG 79. 20 LFA. Pt currently A&Ox3.

## 2013-03-13 ENCOUNTER — Ambulatory Visit: Payer: Medicare Other | Admitting: Physical Therapy

## 2013-03-16 ENCOUNTER — Ambulatory Visit: Payer: Medicare Other

## 2013-03-16 ENCOUNTER — Ambulatory Visit: Payer: Medicare Other | Admitting: Physical Therapy

## 2013-07-24 ENCOUNTER — Encounter (HOSPITAL_COMMUNITY): Payer: Self-pay | Admitting: Emergency Medicine

## 2013-07-24 ENCOUNTER — Emergency Department (HOSPITAL_COMMUNITY)
Admission: EM | Admit: 2013-07-24 | Discharge: 2013-07-24 | Disposition: A | Discharge status: Home / Self Care | Payer: Medicare Other | Attending: Emergency Medicine | Admitting: Emergency Medicine

## 2013-07-24 ENCOUNTER — Emergency Department (HOSPITAL_COMMUNITY): Payer: Medicare Other

## 2013-07-24 DIAGNOSIS — G43909 Migraine, unspecified, not intractable, without status migrainosus: Secondary | ICD-10-CM | POA: Insufficient documentation

## 2013-07-24 DIAGNOSIS — R55 Syncope and collapse: Secondary | ICD-10-CM | POA: Insufficient documentation

## 2013-07-24 DIAGNOSIS — Y939 Activity, unspecified: Secondary | ICD-10-CM | POA: Insufficient documentation

## 2013-07-24 DIAGNOSIS — W1809XA Striking against other object with subsequent fall, initial encounter: Secondary | ICD-10-CM | POA: Insufficient documentation

## 2013-07-24 DIAGNOSIS — S060X9A Concussion with loss of consciousness of unspecified duration, initial encounter: Secondary | ICD-10-CM | POA: Insufficient documentation

## 2013-07-24 DIAGNOSIS — G40909 Epilepsy, unspecified, not intractable, without status epilepticus: Secondary | ICD-10-CM | POA: Insufficient documentation

## 2013-07-24 DIAGNOSIS — F411 Generalized anxiety disorder: Secondary | ICD-10-CM | POA: Insufficient documentation

## 2013-07-24 DIAGNOSIS — F17200 Nicotine dependence, unspecified, uncomplicated: Secondary | ICD-10-CM | POA: Insufficient documentation

## 2013-07-24 DIAGNOSIS — R42 Dizziness and giddiness: Secondary | ICD-10-CM | POA: Insufficient documentation

## 2013-07-24 DIAGNOSIS — Y929 Unspecified place or not applicable: Secondary | ICD-10-CM | POA: Insufficient documentation

## 2013-07-24 DIAGNOSIS — Z8742 Personal history of other diseases of the female genital tract: Secondary | ICD-10-CM | POA: Insufficient documentation

## 2013-07-24 DIAGNOSIS — Z79899 Other long term (current) drug therapy: Secondary | ICD-10-CM | POA: Insufficient documentation

## 2013-07-24 DIAGNOSIS — S0993XA Unspecified injury of face, initial encounter: Secondary | ICD-10-CM | POA: Insufficient documentation

## 2013-07-24 DIAGNOSIS — S0990XA Unspecified injury of head, initial encounter: Secondary | ICD-10-CM

## 2013-07-24 DIAGNOSIS — Z791 Long term (current) use of non-steroidal anti-inflammatories (NSAID): Secondary | ICD-10-CM | POA: Insufficient documentation

## 2013-07-24 DIAGNOSIS — M129 Arthropathy, unspecified: Secondary | ICD-10-CM | POA: Insufficient documentation

## 2013-07-24 DIAGNOSIS — G8929 Other chronic pain: Secondary | ICD-10-CM | POA: Insufficient documentation

## 2013-07-24 MED ORDER — DIPHENHYDRAMINE HCL 50 MG/ML IJ SOLN
25.0000 mg | Freq: Once | INTRAMUSCULAR | Status: AC
  Administered 2013-07-24: 25 mg via INTRAVENOUS
  Filled 2013-07-24: qty 1

## 2013-07-24 MED ORDER — METOCLOPRAMIDE HCL 5 MG/ML IJ SOLN
10.0000 mg | Freq: Once | INTRAMUSCULAR | Status: AC
  Administered 2013-07-24: 10 mg via INTRAVENOUS
  Filled 2013-07-24: qty 2

## 2013-07-24 MED ORDER — TETANUS-DIPHTH-ACELL PERTUSSIS 5-2.5-18.5 LF-MCG/0.5 IM SUSP
0.5000 mL | Freq: Once | INTRAMUSCULAR | Status: DC
  Filled 2013-07-24: qty 0.5

## 2013-07-24 NOTE — ED Provider Notes (Signed)
CSN: [id number]     Arrival date & time 07/24/13  1305 History   First MD Initiated Contact with Patient 07/24/13 1400     Chief Complaint  Patient presents with  . Fall  . Migraine    Patient is a 57 y.o. female presenting with fall and migraines. The history is provided by the patient.  Fall This is a new problem. The current episode started yesterday. The problem has been gradually worsening. Associated symptoms include headaches. Pertinent negatives include no chest pain and no shortness of breath. Nothing aggravates the symptoms. Nothing relieves the symptoms.  Migraine Associated symptoms include headaches. Pertinent negatives include no chest pain and no shortness of breath.  pt reports she had brief "dizzy spell" last night and fell hitting her head.  She reports very brief LOC She reports this has worsened her chronic migraine (reports she has headache daily) No cp/sob She denies proceeding cp/sob before fall.  She reports she has h/o dizzy "spells" in past She also has h/o seizure but does not think she had seizure She reports chronic neck pain that is not new  Past Medical History  Diagnosis Date  . Anxiety   . Suicide and self-inflicted injury   . Menopause   . Chronic back pain   . Arthritis   . Seizures     since 2010   Past Surgical History  Procedure Laterality Date  . Abdominal hysterectomy      2000 and oophorectonmy 2007 for mass which was found to be non -malignant per patient report  . Tubal ligation     Family History  Problem Relation Age of Onset  . Alcohol abuse Mother   . Depression Mother   . Alcohol abuse Father   . Cancer Father     lung ca  . Cancer Maternal Aunt     breast  . Cancer Paternal Aunt     breast   History  Substance Use Topics  . Smoking status: Current Every Day Smoker -- 0.50 packs/day    Types: Cigarettes  . Smokeless tobacco: Not on file  . Alcohol Use: No   OB History   Grav Para Term Preterm Abortions TAB SAB  Ect Mult Living                 Review of Systems  Constitutional: Negative for fever.  Eyes: Negative for visual disturbance.  Respiratory: Negative for shortness of breath.   Cardiovascular: Negative for chest pain.  Gastrointestinal: Negative for vomiting.  Musculoskeletal: Positive for neck pain.  Neurological: Positive for dizziness, syncope and headaches. Negative for weakness.  All other systems reviewed and are negative.    Allergies  Review of patient's allergies indicates no known allergies.  Home Medications   Current Outpatient Rx  Name  Route  Sig  Dispense  Refill  . albuterol (PROVENTIL) (2.5 MG/3ML) 0.083% nebulizer solution   Nebulization   Take 2.5 mg by nebulization every 4 (four) hours as needed for wheezing (pt reports uses as needed and every morning. pt reports is currently out of med with no refills.).         Marland Kitchen cycloSPORINE (RESTASIS) 0.05 % ophthalmic emulsion   Both Eyes   Place 1 drop into both eyes 2 (two) times daily.         Marland Kitchen gabapentin (NEURONTIN) 300 MG capsule   Oral   Take 300 mg by mouth 3 (three) times daily.         Marland Kitchen  latanoprost (XALATAN) 0.005 % ophthalmic solution   Both Eyes   Place 1 drop into both eyes at bedtime.         . levETIRAcetam (KEPPRA) 500 MG tablet   Oral   Take 500 mg by mouth every 12 (twelve) hours.         . naproxen (NAPROSYN) 500 MG tablet   Oral   Take 500 mg by mouth 2 (two) times daily.          BP 124/80  Pulse 62  Temp(Src) 98.3 F (36.8 C) (Oral)  Resp 18  Ht 5' (1.524 m)  Wt 110 lb (49.896 kg)  BMI 21.48 kg/m2  SpO2 100% Physical Exam CONSTITUTIONAL: Well developed/well nourished HEAD:  small abrasion to forehead, otherwise NCAT EYES: EOMI/PERRL, no nystagmus ENMT: Mucous membranes moist, No evidence of facial/nasal trauma NECK: supple no meningeal signs, no bruits SPINE:entire spine nontender, cervical paraspinal tenderness (no new pain in her neck) CV: S1/S2 noted, no  murmurs/rubs/gallops noted LUNGS: Lungs are clear to auscultation bilaterally, no apparent distress ABDOMEN: soft, nontender, no rebound or guarding GU:no cva tenderness NEURO:Awake/alert, facies symmetric, no arm or leg drift is noted Gait normal without ataxia No past pointing EXTREMITIES: pulses normal, full ROM, no tenderness, no deformity noted to extremities SKIN: warm, color normal PSYCH: no abnormalities of mood noted   ED Course  Procedures  Labs Review Labs Reviewed - No data to display Imaging Review Ct Head Wo Contrast  07/24/2013   CLINICAL DATA:  Syncope. Fall with injury in the right periorbital region. Migraine headache.  EXAM: CT HEAD WITHOUT CONTRAST  TECHNIQUE: Contiguous axial images were obtained from the base of the skull through the vertex without intravenous contrast.  COMPARISON:  03/10/2013  FINDINGS: Small dilated perivascular spaces node along the inferior margins of the lentiform nuclei bilaterally. The brainstem, cerebellum, cerebral peduncles, thalamus, basal ganglia, basilar cisterns, and ventricular system appear within normal limits. No intracranial hemorrhage, mass lesion, or acute CVA. Right periorbital soft tissue swelling without discrete orbital rim fracture.  IMPRESSION: Right periorbital soft tissue swelling without orbital rim fracture observed. No acute intracranial findings.   Electronically Signed   By: Herbie Baltimore M.D.   On: 07/24/2013 15:17    EKG Interpretation     Ventricular Rate:  66 PR Interval:  142 QRS Duration: 96 QT Interval:  410 QTC Calculation: 429 R Axis:   -103 Text Interpretation:  Normal sinus rhythm Right superior axis deviation Incomplete right bundle branch block Right ventricular hypertrophy Abnormal ECG When compared with ECG of 28-Sep-2010 13:37, No significant change was found            No signs of acute head injury by CT imaging She reports brief dizziness, ?syncopal episode yesterday.  I doubt  cardiac dysrhythmia as cause (EKG unchaged, she has previous echo in past that showed nml EF and normal aortic valvle) She only requested meds for "spasms" and no other refills requested  MDM   1. Syncope   2. Headache   3. Minor head injury, initial encounter    Nursing notes including past medical history and social history reviewed and considered in documentation Previous records reviewed and considered - previous echo shows normal EF and normal aortic valve     Joya Gaskins, MD 07/24/13 1550

## 2013-07-24 NOTE — ED Notes (Addendum)
Patient had syncopal episode last night, falling and hitting head just above R eye. Does not know how long she was unconscious.   Has since developed Migraine.  Hx of cervical spine problems for which she gets injections.  Photosensitivity and pain rating of 8/10. Reports nausea.   Patient has recently run out of Keppra last week.

## 2013-08-23 ENCOUNTER — Ambulatory Visit: Payer: Self-pay | Admitting: Family Medicine

## 2013-09-06 ENCOUNTER — Ambulatory Visit: Payer: Self-pay | Admitting: Family Medicine

## 2013-10-03 ENCOUNTER — Ambulatory Visit: Payer: Self-pay | Admitting: Family Medicine

## 2013-11-27 ENCOUNTER — Encounter (HOSPITAL_COMMUNITY): Payer: Self-pay | Admitting: Emergency Medicine

## 2013-11-27 ENCOUNTER — Emergency Department (HOSPITAL_COMMUNITY)
Admission: EM | Admit: 2013-11-27 | Discharge: 2013-11-27 | Disposition: A | Discharge status: Home / Self Care | Payer: Medicare Other | Attending: Emergency Medicine | Admitting: Emergency Medicine

## 2013-11-27 DIAGNOSIS — G40909 Epilepsy, unspecified, not intractable, without status epilepticus: Secondary | ICD-10-CM | POA: Insufficient documentation

## 2013-11-27 DIAGNOSIS — Z87828 Personal history of other (healed) physical injury and trauma: Secondary | ICD-10-CM | POA: Insufficient documentation

## 2013-11-27 DIAGNOSIS — Z8742 Personal history of other diseases of the female genital tract: Secondary | ICD-10-CM | POA: Insufficient documentation

## 2013-11-27 DIAGNOSIS — R569 Unspecified convulsions: Secondary | ICD-10-CM

## 2013-11-27 DIAGNOSIS — Z791 Long term (current) use of non-steroidal anti-inflammatories (NSAID): Secondary | ICD-10-CM | POA: Insufficient documentation

## 2013-11-27 DIAGNOSIS — R404 Transient alteration of awareness: Secondary | ICD-10-CM | POA: Insufficient documentation

## 2013-11-27 DIAGNOSIS — Z79899 Other long term (current) drug therapy: Secondary | ICD-10-CM | POA: Insufficient documentation

## 2013-11-27 DIAGNOSIS — M129 Arthropathy, unspecified: Secondary | ICD-10-CM | POA: Insufficient documentation

## 2013-11-27 DIAGNOSIS — G8929 Other chronic pain: Secondary | ICD-10-CM | POA: Insufficient documentation

## 2013-11-27 DIAGNOSIS — F411 Generalized anxiety disorder: Secondary | ICD-10-CM | POA: Insufficient documentation

## 2013-11-27 DIAGNOSIS — F17200 Nicotine dependence, unspecified, uncomplicated: Secondary | ICD-10-CM | POA: Insufficient documentation

## 2013-11-27 DIAGNOSIS — M549 Dorsalgia, unspecified: Secondary | ICD-10-CM | POA: Insufficient documentation

## 2013-11-27 LAB — BASIC METABOLIC PANEL
BUN: 8 mg/dL (ref 6–23)
CHLORIDE: 102 meq/L (ref 96–112)
CO2: 31 meq/L (ref 19–32)
Calcium: 9.2 mg/dL (ref 8.4–10.5)
Creatinine, Ser: 0.57 mg/dL (ref 0.50–1.10)
GFR calc Af Amer: >90 mL/min (ref >90)
GFR calc non Af Amer: >90 mL/min (ref >90)
GLUCOSE: 104 mg/dL — AB (ref 70–99)
POTASSIUM: 4.4 meq/L (ref 3.7–5.3)
SODIUM: 142 meq/L (ref 137–147)

## 2013-11-27 LAB — CBC WITH DIFFERENTIAL/PLATELET
Basophils Absolute: 0 10*3/uL (ref 0.0–0.1)
Basophils Relative: 0 % (ref 0–1)
EOS ABS: 0.1 10*3/uL (ref 0.0–0.7)
EOS PCT: 1 % (ref 0–5)
HCT: 37.9 % (ref 36.0–46.0)
HEMOGLOBIN: 13.1 g/dL (ref 12.0–15.0)
LYMPHS ABS: 1.9 10*3/uL (ref 0.7–4.0)
LYMPHS PCT: 15 % (ref 12–46)
MCH: 30.8 pg (ref 26.0–34.0)
MCHC: 34.6 g/dL (ref 30.0–36.0)
MCV: 89 fL (ref 78.0–100.0)
MONOS PCT: 4 % (ref 3–12)
Monocytes Absolute: 0.5 10*3/uL (ref 0.1–1.0)
Neutro Abs: 9.8 10*3/uL — ABNORMAL HIGH (ref 1.7–7.7)
Neutrophils Relative %: 80 % — ABNORMAL HIGH (ref 43–77)
Platelets: 299 10*3/uL (ref 150–400)
RBC: 4.26 MIL/uL (ref 3.87–5.11)
RDW: 13.7 % (ref 11.5–15.5)
WBC: 12.3 10*3/uL — AB (ref 4.0–10.5)

## 2013-11-27 MED ORDER — LEVETIRACETAM 500 MG PO TABS: 500.0000 mg | ORAL_TABLET | Freq: Two times a day (BID) | ORAL | Status: DC

## 2013-11-27 MED ORDER — LEVETIRACETAM 500 MG PO TABS
500.0000 mg | ORAL_TABLET | Freq: Once | ORAL | Status: AC
  Administered 2013-11-27: 500 mg via ORAL
  Filled 2013-11-27: qty 1

## 2013-11-27 NOTE — ED Provider Notes (Signed)
CSN: [id number]     Arrival date & time 11/27/13  1528 History   First MD Initiated Contact with Patient 11/27/13 1556     Chief Complaint  Patient presents with  . Seizures      The history is provided by the patient.  pt presents from home for presumed seizure.  She was found unconscious by son, and EMS called and she was alert/confused.  She is now feeling improved.  Nothing worsens her symptoms.  She reports h/o seizures and is med compliant with keppra.  She denies HA/weakness/cp/sob/fever.  She reports she had been at her baseline earlier today.  She denies any current complaints.  She denies SI and denies any overdose of medications  Past Medical History  Diagnosis Date  . Anxiety   . Suicide and self-inflicted injury   . Menopause   . Chronic back pain   . Arthritis   . Seizures     since 2010   Past Surgical History  Procedure Laterality Date  . Abdominal hysterectomy      2000 and oophorectonmy 2007 for mass which was found to be non -malignant per patient report  . Tubal ligation     Family History  Problem Relation Age of Onset  . Alcohol abuse Mother   . Depression Mother   . Alcohol abuse Father   . Cancer Father     lung ca  . Cancer Maternal Aunt     breast  . Cancer Paternal Aunt     breast   History  Substance Use Topics  . Smoking status: Current Every Day Smoker -- 0.50 packs/day    Types: Cigarettes  . Smokeless tobacco: Not on file  . Alcohol Use: No   OB History   Grav Para Term Preterm Abortions TAB SAB Ect Mult Living                 Review of Systems  Constitutional: Negative for fever.  Respiratory: Negative for shortness of breath.   Cardiovascular: Negative for chest pain.  Gastrointestinal: Negative for vomiting.  Neurological: Negative for headaches.  Psychiatric/Behavioral: Negative for suicidal ideas.  All other systems reviewed and are negative.      Allergies  Review of patient's allergies indicates no known  allergies.  Home Medications   Current Outpatient Rx  Name  Route  Sig  Dispense  Refill  . albuterol (PROVENTIL) (2.5 MG/3ML) 0.083% nebulizer solution   Nebulization   Take 2.5 mg by nebulization every 4 (four) hours as needed for wheezing (pt reports uses as needed and every morning. pt reports is currently out of med with no refills.).         Marland Kitchen. cycloSPORINE (RESTASIS) 0.05 % ophthalmic emulsion   Both Eyes   Place 1 drop into both eyes 2 (two) times daily.         Marland Kitchen. gabapentin (NEURONTIN) 300 MG capsule   Oral   Take 300 mg by mouth 3 (three) times daily.         Marland Kitchen. latanoprost (XALATAN) 0.005 % ophthalmic solution   Both Eyes   Place 1 drop into both eyes at bedtime.         . levETIRAcetam (KEPPRA) 500 MG tablet   Oral   Take 500 mg by mouth every 12 (twelve) hours.         . naproxen (NAPROSYN) 500 MG tablet   Oral   Take 500 mg by mouth 2 (two) times daily.  BP 114/85  Pulse 105  Temp(Src) 98.2 F (36.8 C) (Oral)  Resp 18  Ht 5' (1.524 m)  Wt 110 lb (49.896 kg)  BMI 21.48 kg/m2  SpO2 100% Physical Exam CONSTITUTIONAL: Well developed/well nourished HEAD: Normocephalic/atraumatic, no signs of trauma EYES: EOMI/PERRL ENMT: Mucous membranes moist, no tongue lacerations, No evidence of facial/nasal trauma NECK: supple no meningeal signs SPINE:entire spine nontender CV: S1/S2 noted, no murmurs/rubs/gallops noted LUNGS: Lungs are clear to auscultation bilaterally, no apparent distress ABDOMEN: soft, nontender, no rebound or guarding GU:no cva tenderness NEURO: Pt is awake/alert, moves all extremitiesx4, no arm/leg drift.  No facial droop.   EXTREMITIES: pulses normal, full ROM, All other extremities/joints palpated/ranged and nontender SKIN: warm, color normal PSYCH: no abnormalities of mood noted  ED Course  Procedures  4:29 PM Pt brought in for presumed seizure She is getting back to baseline, she is in no distress Will keep on  seizure precautions and follow closely 6:06 PM Pt back to baseline  she is ambulatory, no distress She reports she has missed recent keppra doses and is out of meds (told me earlier she had it) Will give dose here and give Rx for outpatient D/w son by phone, he reports he found her unconscious, and it looked as if she had seizure, similar to prior episodes She is back to baseline, stable for d/c home, we discussed strict return precautions Defer ct head as no signs of trauma at this time Labs Review Labs Reviewed  BASIC METABOLIC PANEL - Abnormal; Notable for the following:    Glucose, Bld 104 (*)    All other components within normal limits  CBC WITH DIFFERENTIAL - Abnormal; Notable for the following:    WBC 12.3 (*)    Neutrophils Relative % 80 (*)    Neutro Abs 9.8 (*)    All other components within normal limits    EKG Interpretation    Date/Time:  Monday November 27 2013 17:43:17 EST Ventricular Rate:  74 PR Interval:  154 QRS Duration: 96 QT Interval:  402 QTC Calculation: 446 R Axis:   -106 Text Interpretation:  Normal sinus rhythm Possible Left atrial enlargement Right superior axis deviation Incomplete right bundle branch block Possible Right ventricular hypertrophy Septal infarct , age undetermined Abnormal ECG Confirmed by Bebe Shaggy  MD, Pansey Pinheiro [phone]) on 11/27/2013 6:01:17 PM            MDM   Final diagnoses:  Seizure    Nursing notes including past medical history and social history reviewed and considered in documentation Labs/vital reviewed and considered Previous records reviewed and considered     Joya Gaskins, MD 11/27/13 1807

## 2013-11-27 NOTE — ED Notes (Signed)
nad noted prior to dc. Dc instructions reviewed and explained. Voiced understanding 1 script given.  

## 2013-11-27 NOTE — ED Notes (Signed)
Per ems-pt found unconscious by son. ? Seizure, un witnessed. Pt alert and confused upon EMS arrival. Pt a and o to person and place.

## 2013-11-27 NOTE — Discharge Instructions (Signed)
Driving and Equipment Restrictions Some medical problems make it dangerous to drive, ride a bike, or use machines. Some of these problems are:  A hard blow to the head (concussion).  Passing out (fainting).  Twitching and shaking (seizures).  Low blood sugar.  Taking medicine to help you relax (sedatives).  Taking pain medicines.  Wearing an eye patch.  Wearing splints. This can make it hard to use parts of your body that you need to drive safely. HOME CARE   Do not drive until your doctor says it is okay.  Do not use machines until your doctor says it is okay. You may need a form signed by your doctor (medical release) before you can drive again. You may also need this form before you do other tasks where you need to be fully alert. MAKE SURE YOU:  Understand these instructions.  Will watch your condition.  Will get help right away if you are not doing well or get worse. Document Released: 10/29/2004 Document Revised: 12/14/2011 Document Reviewed: 01/29/2010 [facility] Patient Information 2014 [place].   Please be aware you may have another seizure  Do not drive until seen by your physician for your condition  Do not climb ladders/roofs/trees as a seizure can occur at that height and cause serious harm  Do not bathe/swim alone as a seizure can occur and cause serious harm  Please followup with your physician or neurologist for further testing and possible treatment   Seizure, Adult A seizure is abnormal electrical activity in the brain. Seizures usually last from 30 seconds to 2 minutes. There are various types of seizures. Before a seizure, you may have a warning sensation (aura) that a seizure is about to occur. An aura may include the following symptoms:   Fear or anxiety.  Nausea.  Feeling like the room is spinning (vertigo).  Vision changes, such as seeing flashing lights or spots. Common symptoms during a seizure include:  A change in attention  or behavior (altered mental status).  Convulsions with rhythmic jerking movements.  Drooling.  Rapid eye movements.  Grunting.  Loss of bladder and bowel control.  Bitter taste in the mouth.  Tongue biting. After a seizure, you may feel confused and sleepy. You may also have an injury resulting from convulsions during the seizure. HOME CARE INSTRUCTIONS   If you are given medicines, take them exactly as prescribed by your health care provider.  Keep all follow-up appointments as directed by your health care provider.  Do not swim or drive or engage in risky activity during which a seizure could cause further injury to you or others until your health care provider says it is OK.  Get adequate rest.  Teach friends and family what to do if you have a seizure. They should:  Lay you on the ground to prevent a fall.  Put a cushion under your head.  Loosen any tight clothing around your neck.  Turn you on your side. If vomiting occurs, this helps keep your airway clear.  Stay with you until you recover.  Know whether or not you need emergency care. SEEK IMMEDIATE MEDICAL CARE IF:  The seizure lasts longer than 5 minutes.  The seizure is severe or you do not wake up immediately after the seizure.  You have an altered mental status after the seizure.  You are having more frequent or worsening seizures. Someone should drive you to the emergency department or call local emergency services (911 in [place] MAKE SURE YOU:  Understand these instructions.  Will watch your condition.  Will get help right away if you are not doing well or get worse. Document Released: 09/18/2000 Document Revised: 07/12/2013 Document Reviewed: 05/03/2013 [facility] Patient Information 2014 [place].

## 2014-03-02 ENCOUNTER — Encounter (HOSPITAL_COMMUNITY): Payer: Self-pay | Admitting: Pharmacy Technician

## 2014-03-06 NOTE — Patient Instructions (Signed)
Doris DissLinda R Higgins  03/06/2014   Your procedure is scheduled on:  03/12/14  [facility]  Call this number if you have problems the morning of surgery: [phone]   Remember:   Do not eat food or drink liquids after midnight.   Take these medicines the morning of surgery with A SIP OF WATER: Flexeril, Gabapentin,  Mirapex, Keppra   Do not wear jewelry, make-up or nail polish.  Do not wear lotions, powders, or perfumes. You may wear deodorant.  Do not shave 48 hours prior to surgery. Men may shave face and neck.  Do not bring valuables to the hospital.  [facility] belongings or  valuables.               Contacts, dentures or bridgework may not be worn into surgery.  Leave suitcase in the car. After surgery it may be brought to your room.  For patients admitted to the hospital, discharge time is determined by your treatment team.               Patients discharged the day of surgery will not be allowed to drive home.  Name and phone number of your driver:   Special Instructions: N/A   Please read over the following fact sheets that you were given: Anesthesia Post-op Instructions    PATIENT INSTRUCTIONS POST-ANESTHESIA  IMMEDIATELY FOLLOWING SURGERY:  Do not drive or operate machinery for the first twenty four hours after surgery.  Do not make any important decisions for twenty four hours after surgery or while taking narcotic pain medications or sedatives.  If you develop intractable nausea and vomiting or a severe headache please notify your doctor immediately.  FOLLOW-UP:  Please make an appointment with your surgeon as instructed. You do not need to follow up with anesthesia unless specifically instructed to do so.  WOUND CARE INSTRUCTIONS (if applicable):  Keep a dry clean dressing on the anesthesia/puncture wound site if there is drainage.  Once the wound has quit draining you may leave it open to air.  Generally you should leave the  bandage intact for twenty four hours unless there is drainage.  If the epidural site drains for more than 36-48 hours please call the anesthesia department.  QUESTIONS?:  Please feel free to call your physician or the hospital operator if you have any questions, and they will be happy to assist you.      Cataract Surgery  A cataract is a clouding of the lens of the eye. When a lens becomes cloudy, vision is reduced based on the degree and nature of the clouding. Surgery may be needed to improve vision. Surgery removes the cloudy lens and usually replaces it with a substitute lens (intraocular lens, IOL). LET YOUR EYE DOCTOR KNOW ABOUT:  Allergies to food or medicine.  Medicines taken including herbs, eyedrops, over-the-counter medicines, and creams.  Use of steroids (by mouth or creams).  Previous problems with anesthetics or numbing medicine.  History of bleeding problems or blood clots.  Previous surgery.  Other health problems, including diabetes and kidney problems.  Possibility of pregnancy, if this applies. RISKS AND COMPLICATIONS  Infection.  Inflammation of the eyeball (endophthalmitis) that can spread to both eyes (sympathetic ophthalmia).  Poor wound healing.  If an IOL is inserted, it can later fall out of proper position. This is very uncommon.  Clouding of the part of your eye that holds an IOL in place. This  is called an "after-cataract." These are uncommon, but easily treated. BEFORE THE PROCEDURE  Do not eat or drink anything except small amounts of water for 8 to 12 before your surgery, or as directed by your caregiver.  Unless you are told otherwise, continue any eyedrops you have been prescribed.  Talk to your primary caregiver about all other medicines that you take (both prescription and non-prescription). In some cases, you may need to stop or change medicines near the time of your surgery. This is most important if you are taking blood-thinning  medicine.Do not stop medicines unless you are told to do so.  Arrange for someone to drive you to and from the procedure.  Do not put contact lenses in either eye on the day of your surgery. PROCEDURE There is more than one method for safely removing a cataract. Your doctor can explain the differences and help determine which is best for you. Phacoemulsification surgery is the most common form of cataract surgery.  An injection is given behind the eye or eyedrops are given to make this a painless procedure.  A small cut (incision) is made on the edge of the clear, dome-shaped surface that covers the front of the eye (cornea).  A tiny probe is painlessly inserted into the eye. This device gives off ultrasound waves that soften and break up the cloudy center of the lens. This makes it easier for the cloudy lens to be removed by suction.  An IOL may be implanted.  The normal lens of the eye is covered by a clear capsule. Part of that capsule is intentionally left in the eye to support the IOL.  Your surgeon may or may not use stitches to close the incision. There are other forms of cataract surgery that require a larger incision and stiches to close the eye. This approach is taken in cases where the doctor feels that the cataract cannot be easily removed using phacoemulsification. AFTER THE PROCEDURE  When an IOL is implanted, it does not need care. It becomes a permanent part of your eye and cannot be seen or felt.  Your doctor will schedule follow-up exams to check on your progress.  Review your other medicines with your doctor to see which can be resumed after surgery.  Use eyedrops or take medicine as prescribed by your doctor. Document Released: 09/10/2011 Document Revised: 12/14/2011 Document Reviewed: 09/10/2011 [facility] Patient Information 2014 [place], [place].

## 2014-03-07 ENCOUNTER — Encounter (HOSPITAL_COMMUNITY)
Admission: RE | Admit: 2014-03-07 | Discharge: 2014-03-07 | Disposition: A | Discharge status: Home / Self Care | Payer: Medicare Other | Source: Ambulatory Visit | Attending: Ophthalmology | Admitting: Ophthalmology

## 2014-03-07 ENCOUNTER — Encounter (HOSPITAL_COMMUNITY): Payer: Self-pay

## 2014-03-07 ENCOUNTER — Other Ambulatory Visit: Payer: Self-pay

## 2014-03-07 DIAGNOSIS — Z01812 Encounter for preprocedural laboratory examination: Secondary | ICD-10-CM | POA: Insufficient documentation

## 2014-03-07 DIAGNOSIS — Z01810 Encounter for preprocedural cardiovascular examination: Secondary | ICD-10-CM | POA: Insufficient documentation

## 2014-03-07 LAB — BASIC METABOLIC PANEL
BUN: 8 mg/dL (ref 6–23)
CALCIUM: 9.2 mg/dL (ref 8.4–10.5)
CHLORIDE: 102 meq/L (ref 96–112)
CO2: 31 meq/L (ref 19–32)
Creatinine, Ser: 0.63 mg/dL (ref 0.50–1.10)
GFR calc Af Amer: >90 mL/min (ref >90)
GFR calc non Af Amer: >90 mL/min (ref >90)
GLUCOSE: 93 mg/dL (ref 70–99)
Potassium: 3.7 mEq/L (ref 3.7–5.3)
Sodium: 143 mEq/L (ref 137–147)

## 2014-03-07 LAB — HEMOGLOBIN AND HEMATOCRIT, BLOOD
HEMATOCRIT: 36.9 % (ref 36.0–46.0)
Hemoglobin: 12.6 g/dL (ref 12.0–15.0)

## 2014-03-07 NOTE — Pre-Procedure Instructions (Signed)
Patient given information to sign up for my chart at home. 

## 2014-03-09 MED ORDER — TETRACAINE HCL 0.5 % OP SOLN
OPHTHALMIC | Status: AC
  Filled 2014-03-09: qty 2

## 2014-03-09 MED ORDER — PHENYLEPHRINE HCL 2.5 % OP SOLN
OPHTHALMIC | Status: AC
  Filled 2014-03-09: qty 15

## 2014-03-09 MED ORDER — NEOMYCIN-POLYMYXIN-DEXAMETH 3.5-10000-0.1 OP SUSP
OPHTHALMIC | Status: AC
  Filled 2014-03-09: qty 5

## 2014-03-09 MED ORDER — LIDOCAINE HCL (PF) 1 % IJ SOLN
INTRAMUSCULAR | Status: AC
  Filled 2014-03-09: qty 2

## 2014-03-09 MED ORDER — CYCLOPENTOLATE-PHENYLEPHRINE OP SOLN OPTIME - NO CHARGE
OPHTHALMIC | Status: AC
  Filled 2014-03-09: qty 2

## 2014-03-09 MED ORDER — LIDOCAINE HCL 3.5 % OP GEL
OPHTHALMIC | Status: AC
  Filled 2014-03-09: qty 1

## 2014-03-12 ENCOUNTER — Encounter (HOSPITAL_COMMUNITY)
Admission: RE | Disposition: A | Discharge status: Home / Self Care | Payer: Self-pay | Source: Ambulatory Visit | Attending: Ophthalmology

## 2014-03-12 ENCOUNTER — Ambulatory Visit (HOSPITAL_COMMUNITY)
Admission: RE | Admit: 2014-03-12 | Discharge: 2014-03-12 | Disposition: A | Discharge status: Home / Self Care | Payer: Medicare Other | Source: Ambulatory Visit | Attending: Ophthalmology | Admitting: Ophthalmology

## 2014-03-12 ENCOUNTER — Encounter (HOSPITAL_COMMUNITY): Payer: Self-pay | Admitting: *Deleted

## 2014-03-12 ENCOUNTER — Ambulatory Visit (HOSPITAL_COMMUNITY): Payer: Medicare Other | Admitting: Anesthesiology

## 2014-03-12 ENCOUNTER — Encounter (HOSPITAL_COMMUNITY): Payer: Medicare Other | Admitting: Anesthesiology

## 2014-03-12 DIAGNOSIS — F3289 Other specified depressive episodes: Secondary | ICD-10-CM | POA: Diagnosis not present

## 2014-03-12 DIAGNOSIS — F411 Generalized anxiety disorder: Secondary | ICD-10-CM | POA: Insufficient documentation

## 2014-03-12 DIAGNOSIS — F17200 Nicotine dependence, unspecified, uncomplicated: Secondary | ICD-10-CM | POA: Diagnosis not present

## 2014-03-12 DIAGNOSIS — G40909 Epilepsy, unspecified, not intractable, without status epilepticus: Secondary | ICD-10-CM | POA: Diagnosis not present

## 2014-03-12 DIAGNOSIS — H2510 Age-related nuclear cataract, unspecified eye: Secondary | ICD-10-CM | POA: Insufficient documentation

## 2014-03-12 DIAGNOSIS — F329 Major depressive disorder, single episode, unspecified: Secondary | ICD-10-CM | POA: Diagnosis not present

## 2014-03-12 HISTORY — PX: CATARACT EXTRACTION W/PHACO: SHX586

## 2014-03-12 SURGERY — PHACOEMULSIFICATION, CATARACT, WITH IOL INSERTION
Anesthesia: Monitor Anesthesia Care | Site: Eye | Laterality: Left

## 2014-03-12 MED ORDER — EPINEPHRINE HCL 1 MG/ML IJ SOLN
INTRAOCULAR | Status: DC | PRN
  Administered 2014-03-12: 12:00:00

## 2014-03-12 MED ORDER — LIDOCAINE HCL (PF) 1 % IJ SOLN
INTRAMUSCULAR | Status: DC | PRN
  Administered 2014-03-12: .5 mL

## 2014-03-12 MED ORDER — FENTANYL CITRATE 0.05 MG/ML IJ SOLN
25.0000 ug | INTRAMUSCULAR | Status: AC
  Administered 2014-03-12: 25 ug via INTRAVENOUS

## 2014-03-12 MED ORDER — EPINEPHRINE HCL 1 MG/ML IJ SOLN
INTRAMUSCULAR | Status: AC
  Filled 2014-03-12: qty 1

## 2014-03-12 MED ORDER — CYCLOPENTOLATE-PHENYLEPHRINE 0.2-1 % OP SOLN
1.0000 [drp] | OPHTHALMIC | Status: AC
  Administered 2014-03-12 (×3): 1 [drp] via OPHTHALMIC

## 2014-03-12 MED ORDER — BSS IO SOLN
INTRAOCULAR | Status: DC | PRN
  Administered 2014-03-12: 15 mL

## 2014-03-12 MED ORDER — ONDANSETRON HCL 4 MG/2ML IJ SOLN: 4.0000 mg | Freq: Once | INTRAMUSCULAR | Status: DC | PRN

## 2014-03-12 MED ORDER — FENTANYL CITRATE 0.05 MG/ML IJ SOLN
INTRAMUSCULAR | Status: AC
Start: 2014-03-12 — End: 2014-03-12
  Filled 2014-03-12: qty 2

## 2014-03-12 MED ORDER — ACETAZOLAMIDE 250 MG PO TABS
250.0000 mg | ORAL_TABLET | Freq: Once | ORAL | Status: AC
  Administered 2014-03-12: 250 mg via ORAL
  Filled 2014-03-12: qty 1

## 2014-03-12 MED ORDER — PROVISC 10 MG/ML IO SOLN
INTRAOCULAR | Status: DC | PRN
  Administered 2014-03-12: 0.85 mL via INTRAOCULAR

## 2014-03-12 MED ORDER — LACTATED RINGERS IV SOLN
INTRAVENOUS | Status: DC
  Administered 2014-03-12: 12:00:00 via INTRAVENOUS

## 2014-03-12 MED ORDER — LACTATED RINGERS IV SOLN
INTRAVENOUS | Status: DC | PRN
  Administered 2014-03-12: 12:00:00 via INTRAVENOUS

## 2014-03-12 MED ORDER — FENTANYL CITRATE 0.05 MG/ML IJ SOLN: 25.0000 ug | INTRAMUSCULAR | Status: DC | PRN

## 2014-03-12 MED ORDER — MIDAZOLAM HCL 2 MG/2ML IJ SOLN
INTRAMUSCULAR | Status: AC
  Filled 2014-03-12: qty 2

## 2014-03-12 MED ORDER — POVIDONE-IODINE 5 % OP SOLN
OPHTHALMIC | Status: DC | PRN
  Administered 2014-03-12: 1 via OPHTHALMIC

## 2014-03-12 MED ORDER — MIDAZOLAM HCL 2 MG/2ML IJ SOLN
1.0000 mg | INTRAMUSCULAR | Status: DC | PRN
  Administered 2014-03-12: 2 mg via INTRAVENOUS

## 2014-03-12 MED ORDER — TETRACAINE HCL 0.5 % OP SOLN
1.0000 [drp] | OPHTHALMIC | Status: AC
  Administered 2014-03-12 (×3): 1 [drp] via OPHTHALMIC

## 2014-03-12 MED ORDER — PHENYLEPHRINE HCL 2.5 % OP SOLN
1.0000 [drp] | OPHTHALMIC | Status: AC
  Administered 2014-03-12 (×3): 1 [drp] via OPHTHALMIC

## 2014-03-12 MED ORDER — LIDOCAINE HCL 3.5 % OP GEL
1.0000 "application " | Freq: Once | OPHTHALMIC | Status: AC
  Administered 2014-03-12: 1 via OPHTHALMIC

## 2014-03-12 MED ORDER — NEOMYCIN-POLYMYXIN-DEXAMETH 3.5-10000-0.1 OP SUSP
OPHTHALMIC | Status: DC | PRN
  Administered 2014-03-12: 2 [drp] via OPHTHALMIC

## 2014-03-12 SURGICAL SUPPLY — 33 items
CAPSULAR TENSION RING-AMO (OPHTHALMIC RELATED) IMPLANT
CLOTH BEACON ORANGE TIMEOUT ST (SAFETY) ×3 IMPLANT
EYE SHIELD UNIVERSAL CLEAR (GAUZE/BANDAGES/DRESSINGS) ×3 IMPLANT
GLOVE BIO SURGEON STRL SZ 6.5 (GLOVE) IMPLANT
GLOVE BIO SURGEONS STRL SZ 6.5 (GLOVE)
GLOVE BIOGEL PI IND STRL 6.5 (GLOVE) ×1 IMPLANT
GLOVE BIOGEL PI IND STRL 7.0 (GLOVE) ×1 IMPLANT
GLOVE BIOGEL PI IND STRL 7.5 (GLOVE) IMPLANT
GLOVE BIOGEL PI INDICATOR 6.5 (GLOVE) ×2
GLOVE BIOGEL PI INDICATOR 7.0 (GLOVE) ×2
GLOVE BIOGEL PI INDICATOR 7.5 (GLOVE)
GLOVE ECLIPSE 6.5 STRL STRAW (GLOVE) IMPLANT
GLOVE ECLIPSE 7.0 STRL STRAW (GLOVE) IMPLANT
GLOVE ECLIPSE 7.5 STRL STRAW (GLOVE) IMPLANT
GLOVE EXAM NITRILE LRG STRL (GLOVE) IMPLANT
GLOVE EXAM NITRILE MD LF STRL (GLOVE) IMPLANT
GLOVE SKINSENSE NS SZ6.5 (GLOVE)
GLOVE SKINSENSE NS SZ7.0 (GLOVE)
GLOVE SKINSENSE STRL SZ6.5 (GLOVE) IMPLANT
GLOVE SKINSENSE STRL SZ7.0 (GLOVE) IMPLANT
KIT VITRECTOMY (OPHTHALMIC RELATED) IMPLANT
PAD ARMBOARD 7.5X6 YLW CONV (MISCELLANEOUS) ×3 IMPLANT
PROC W NO LENS (INTRAOCULAR LENS)
PROC W SPEC LENS (INTRAOCULAR LENS)
PROCESS W NO LENS (INTRAOCULAR LENS) IMPLANT
PROCESS W SPEC LENS (INTRAOCULAR LENS) IMPLANT
RING MALYGIN (MISCELLANEOUS) IMPLANT
SIGHTPATH CAT PROC W REG LENS (Ophthalmic Related) ×3 IMPLANT
SYR TB 1ML LL NO SAFETY (SYRINGE) ×3 IMPLANT
TAPE SURG TRANSPORE 1 IN (GAUZE/BANDAGES/DRESSINGS) ×1 IMPLANT
TAPE SURGICAL TRANSPORE 1 IN (GAUZE/BANDAGES/DRESSINGS) ×2
VISCOELASTIC ADDITIONAL (OPHTHALMIC RELATED) IMPLANT
WATER STERILE IRR 250ML POUR (IV SOLUTION) ×3 IMPLANT

## 2014-03-12 NOTE — Transfer of Care (Signed)
Immediate Anesthesia Transfer of Care Note  Patient: Doris Higgins  Procedure(s) Performed: Procedure(s) with comments: CATARACT EXTRACTION PHACO AND INTRAOCULAR LENS PLACEMENT (IOC) (Left) - CDE:7.05  Patient Location: Short Stay  Anesthesia Type:MAC  Level of Consciousness: awake, alert , oriented and patient cooperative  Airway & Oxygen Therapy: Patient Spontanous Breathing  Post-op Assessment: Report given to PACU RN and Post -op Vital signs reviewed and stable  Post vital signs: Reviewed and stable  Complications: No apparent anesthesia complications

## 2014-03-12 NOTE — Anesthesia Procedure Notes (Signed)
Procedure Name: MAC Performed by: Nickalos Petersen A Pre-anesthesia Checklist: Timeout performed, Patient identified, Emergency Drugs available, Suction available and Patient being monitored Oxygen Delivery Method: Nasal cannula       

## 2014-03-12 NOTE — Anesthesia Postprocedure Evaluation (Signed)
  Anesthesia Post-op Note  Patient: Doris Higgins  Procedure(s) Performed: Procedure(s) with comments: CATARACT EXTRACTION PHACO AND INTRAOCULAR LENS PLACEMENT (IOC) (Left) - CDE:7.05  Patient Location: Short Stay  Anesthesia Type:MAC  Level of Consciousness: awake, alert , oriented and patient cooperative  Airway and Oxygen Therapy: Patient Spontanous Breathing  Post-op Pain: none  Post-op Assessment: Post-op Vital signs reviewed, Patient's Cardiovascular Status Stable, Respiratory Function Stable, Patent Airway, No signs of Nausea or vomiting and Pain level controlled  Post-op Vital Signs: Reviewed and stable  Last Vitals:  Filed Vitals:   03/12/14 1200  BP: 110/53  Pulse:   Temp:   Resp: 23    Complications: No apparent anesthesia complications

## 2014-03-12 NOTE — Anesthesia Preprocedure Evaluation (Signed)
Anesthesia Evaluation  Patient identified by MRN, date of birth, ID band Patient awake    Reviewed: Allergy & Precautions, H&P , NPO status , Patient's Chart, lab work & pertinent test results  Airway Mallampati: II TM Distance: >3 FB     Dental  (+) Teeth Intact   Pulmonary Current Smoker,  breath sounds clear to auscultation        Cardiovascular negative cardio ROS  Rate:Normal     Neuro/Psych Seizures -, Well Controlled,  PSYCHIATRIC DISORDERS Anxiety Depression  Neuromuscular disease    GI/Hepatic   Endo/Other    Renal/GU      Musculoskeletal   Abdominal   Peds  Hematology   Anesthesia Other Findings   Reproductive/Obstetrics                           Anesthesia Physical Anesthesia Plan  ASA: II  Anesthesia Plan: MAC   Post-op Pain Management:    Induction: Intravenous  Airway Management Planned: Nasal Cannula  Additional Equipment:   Intra-op Plan:   Post-operative Plan:   Informed Consent: I have reviewed the patients History and Physical, chart, labs and discussed the procedure including the risks, benefits and alternatives for the proposed anesthesia with the patient or authorized representative who has indicated his/her understanding and acceptance.     Plan Discussed with:   Anesthesia Plan Comments:         Anesthesia Quick Evaluation  

## 2014-03-12 NOTE — H&P (Signed)
I have reviewed the H&P, the patient was re-examined, and I have identified no interval changes in medical condition and plan of care since the history and physical of record  

## 2014-03-12 NOTE — Op Note (Signed)
Date of Admission: 03/12/2014  Date of Surgery: 03/12/2014   Pre-Op Dx: Cataract Left Eye  Post-Op Dx: Nuclear Cataract Left  Eye,  Dx Code 366.16  Surgeon: Gemma Payor, M.D.  Assistants: None  Anesthesia: Topical with MAC  Indications: Painless, progressive loss of vision with compromise of daily activities.  Surgery: Cataract Extraction with Intraocular lens Implant Left Eye  Discription: The patient had dilating drops and viscous lidocaine placed into the Left eye in the pre-op holding area. After transfer to the operating room, a time out was performed. The patient was then prepped and draped. Beginning with a 75 degree blade a paracentesis port was made at the surgeon's 2 o'clock position. The anterior chamber was then filled with 1% non-preserved lidocaine. This was followed by filling the anterior chamber with Provisc.  A 2.66mm keratome blade was used to make a clear corneal incision at the temporal limbus.  A bent cystatome needle was used to create a continuous tear capsulotomy. Hydrodissection was performed with balanced salt solution on a Fine canula. The lens nucleus was then removed using the phacoemulsification handpiece. Residual cortex was removed with the I&A handpiece. The anterior chamber and capsular bag were refilled with Provisc. A posterior chamber intraocular lens was placed into the capsular bag with it's injector. The implant was positioned with the Kuglan hook. The Provisc was then removed from the anterior chamber and capsular bag with the I&A handpiece. Stromal hydration of the main incision and paracentesis port was performed with BSS on a Fine canula. The wounds were tested for leak which was negative. The patient tolerated the procedure well. There were no operative complications. The patient was then transferred to the recovery room in stable condition.  Complications: None  Specimen: None  EBL: None  Prosthetic device: Hoya iSert 250, power 20.5 D, SN  [id number].

## 2014-03-12 NOTE — Discharge Instructions (Signed)

## 2014-03-13 ENCOUNTER — Encounter (HOSPITAL_COMMUNITY): Payer: Self-pay | Admitting: Ophthalmology

## 2014-06-28 ENCOUNTER — Encounter (HOSPITAL_COMMUNITY): Payer: Self-pay | Admitting: Pharmacy Technician

## 2014-07-17 ENCOUNTER — Encounter (HOSPITAL_COMMUNITY): Admission: RE | Admit: 2014-07-17 | Payer: Medicare HMO | Source: Ambulatory Visit

## 2014-07-18 ENCOUNTER — Encounter (HOSPITAL_COMMUNITY): Payer: Self-pay

## 2014-07-18 ENCOUNTER — Encounter (HOSPITAL_COMMUNITY)
Admission: RE | Admit: 2014-07-18 | Discharge: 2014-07-18 | Disposition: A | Discharge status: Home / Self Care | Payer: Medicare HMO | Source: Ambulatory Visit | Attending: Ophthalmology | Admitting: Ophthalmology

## 2014-07-23 ENCOUNTER — Encounter (HOSPITAL_COMMUNITY)
Admission: RE | Disposition: A | Discharge status: Home Health | Payer: Self-pay | Source: Ambulatory Visit | Attending: Ophthalmology

## 2014-07-23 ENCOUNTER — Encounter (HOSPITAL_COMMUNITY): Payer: Medicare HMO | Admitting: Anesthesiology

## 2014-07-23 ENCOUNTER — Encounter (HOSPITAL_COMMUNITY): Payer: Self-pay

## 2014-07-23 ENCOUNTER — Ambulatory Visit (HOSPITAL_COMMUNITY)
Admission: RE | Admit: 2014-07-23 | Discharge: 2014-07-23 | Disposition: A | Discharge status: Home Health | Payer: Medicare HMO | Source: Ambulatory Visit | Attending: Ophthalmology | Admitting: Ophthalmology

## 2014-07-23 ENCOUNTER — Ambulatory Visit (HOSPITAL_COMMUNITY): Payer: Medicare HMO | Admitting: Anesthesiology

## 2014-07-23 DIAGNOSIS — R569 Unspecified convulsions: Secondary | ICD-10-CM | POA: Insufficient documentation

## 2014-07-23 DIAGNOSIS — H269 Unspecified cataract: Secondary | ICD-10-CM | POA: Diagnosis present

## 2014-07-23 DIAGNOSIS — Z79899 Other long term (current) drug therapy: Secondary | ICD-10-CM | POA: Diagnosis not present

## 2014-07-23 DIAGNOSIS — Z791 Long term (current) use of non-steroidal anti-inflammatories (NSAID): Secondary | ICD-10-CM | POA: Diagnosis not present

## 2014-07-23 DIAGNOSIS — F17200 Nicotine dependence, unspecified, uncomplicated: Secondary | ICD-10-CM | POA: Insufficient documentation

## 2014-07-23 DIAGNOSIS — F419 Anxiety disorder, unspecified: Secondary | ICD-10-CM | POA: Insufficient documentation

## 2014-07-23 DIAGNOSIS — F418 Other specified anxiety disorders: Secondary | ICD-10-CM | POA: Diagnosis not present

## 2014-07-23 HISTORY — PX: CATARACT EXTRACTION W/PHACO: SHX586

## 2014-07-23 SURGERY — PHACOEMULSIFICATION, CATARACT, WITH IOL INSERTION
Anesthesia: Monitor Anesthesia Care | Site: Eye | Laterality: Right

## 2014-07-23 MED ORDER — LACTATED RINGERS IV SOLN
INTRAVENOUS | Status: DC
  Administered 2014-07-23: 07:00:00 via INTRAVENOUS

## 2014-07-23 MED ORDER — MIDAZOLAM HCL 5 MG/5ML IJ SOLN
INTRAMUSCULAR | Status: DC | PRN
Start: 2014-07-23 — End: 2014-07-23
  Administered 2014-07-23: 2 mg via INTRAVENOUS

## 2014-07-23 MED ORDER — CYCLOPENTOLATE-PHENYLEPHRINE OP SOLN OPTIME - NO CHARGE
OPHTHALMIC | Status: AC
  Filled 2014-07-23: qty 2

## 2014-07-23 MED ORDER — MIDAZOLAM HCL 2 MG/2ML IJ SOLN
1.0000 mg | INTRAMUSCULAR | Status: DC | PRN
  Administered 2014-07-23: 2 mg via INTRAVENOUS

## 2014-07-23 MED ORDER — NA HYALUR & NA CHOND-NA HYALUR 0.55-0.5 ML IO KIT
PACK | INTRAOCULAR | Status: DC | PRN
  Administered 2014-07-23: 1 via OPHTHALMIC

## 2014-07-23 MED ORDER — FENTANYL CITRATE 0.05 MG/ML IJ SOLN
INTRAMUSCULAR | Status: AC
  Filled 2014-07-23: qty 2

## 2014-07-23 MED ORDER — CYCLOPENTOLATE-PHENYLEPHRINE 0.2-1 % OP SOLN
1.0000 [drp] | OPHTHALMIC | Status: AC
  Administered 2014-07-23 (×3): 1 [drp] via OPHTHALMIC

## 2014-07-23 MED ORDER — TETRACAINE 0.5 % OP SOLN OPTIME - NO CHARGE
OPHTHALMIC | Status: DC | PRN
  Administered 2014-07-23: 2 [drp] via OPHTHALMIC

## 2014-07-23 MED ORDER — LIDOCAINE HCL 3.5 % OP GEL
1.0000 "application " | Freq: Once | OPHTHALMIC | Status: AC
  Administered 2014-07-23: 1 via OPHTHALMIC
  Filled 2014-07-23: qty 1

## 2014-07-23 MED ORDER — BSS IO SOLN
INTRAOCULAR | Status: DC | PRN
  Administered 2014-07-23: 15 mL via INTRAOCULAR

## 2014-07-23 MED ORDER — BSS IO SOLN
INTRAOCULAR | Status: DC | PRN
  Administered 2014-07-23: 08:00:00

## 2014-07-23 MED ORDER — POVIDONE-IODINE 5 % OP SOLN
OPHTHALMIC | Status: DC | PRN
  Administered 2014-07-23: 1 via OPHTHALMIC

## 2014-07-23 MED ORDER — TETRACAINE HCL 0.5 % OP SOLN
1.0000 [drp] | OPHTHALMIC | Status: AC
  Administered 2014-07-23 (×3): 1 [drp] via OPHTHALMIC
  Filled 2014-07-23: qty 2

## 2014-07-23 MED ORDER — LIDOCAINE HCL (PF) 1 % IJ SOLN
INTRAMUSCULAR | Status: AC
  Filled 2014-07-23: qty 2

## 2014-07-23 MED ORDER — MIDAZOLAM HCL 2 MG/2ML IJ SOLN
INTRAMUSCULAR | Status: AC
  Filled 2014-07-23: qty 2

## 2014-07-23 MED ORDER — LIDOCAINE HCL 3.5 % OP GEL
OPHTHALMIC | Status: DC | PRN
  Administered 2014-07-23: 1 via OPHTHALMIC

## 2014-07-23 MED ORDER — EPINEPHRINE HCL 1 MG/ML IJ SOLN
INTRAMUSCULAR | Status: AC
  Filled 2014-07-23: qty 1

## 2014-07-23 MED ORDER — FENTANYL CITRATE 0.05 MG/ML IJ SOLN
25.0000 ug | INTRAMUSCULAR | Status: AC
  Administered 2014-07-23: 25 ug via INTRAVENOUS
  Administered 2014-07-23: 07:00:00 via INTRAVENOUS

## 2014-07-23 SURGICAL SUPPLY — 28 items
CAPSULAR TENSION RING-AMO (OPHTHALMIC RELATED) IMPLANT
CLOTH BEACON ORANGE TIMEOUT ST (SAFETY) ×3 IMPLANT
GLOVE BIO SURGEON STRL SZ7.5 (GLOVE) ×3 IMPLANT
GLOVE BIOGEL M 6.5 STRL (GLOVE) IMPLANT
GLOVE BIOGEL PI IND STRL 6.5 (GLOVE) ×1 IMPLANT
GLOVE BIOGEL PI IND STRL 7.0 (GLOVE) IMPLANT
GLOVE BIOGEL PI INDICATOR 6.5 (GLOVE) ×2
GLOVE BIOGEL PI INDICATOR 7.0 (GLOVE)
GLOVE ECLIPSE 6.5 STRL STRAW (GLOVE) IMPLANT
GLOVE ECLIPSE 7.5 STRL STRAW (GLOVE) IMPLANT
GLOVE EXAM NITRILE LRG STRL (GLOVE) IMPLANT
GLOVE EXAM NITRILE MD LF STRL (GLOVE) ×3 IMPLANT
GLOVE SKINSENSE NS SZ6.5 (GLOVE)
GLOVE SKINSENSE NS SZ7.0 (GLOVE)
GLOVE SKINSENSE STRL SZ6.5 (GLOVE) IMPLANT
GLOVE SKINSENSE STRL SZ7.0 (GLOVE) IMPLANT
INST SET CATARACT ~~LOC~~ (KITS) ×3 IMPLANT
KIT VITRECTOMY (OPHTHALMIC RELATED) IMPLANT
PAD ARMBOARD 7.5X6 YLW CONV (MISCELLANEOUS) ×3 IMPLANT
PROC W NO LENS (INTRAOCULAR LENS)
PROC W SPEC LENS (INTRAOCULAR LENS)
PROCESS W NO LENS (INTRAOCULAR LENS) IMPLANT
PROCESS W SPEC LENS (INTRAOCULAR LENS) IMPLANT
RETRACTOR IRIS SIGHTPATH (OPHTHALMIC RELATED) IMPLANT
RING MALYGIN (MISCELLANEOUS) IMPLANT
SIGHTPATH CAT PROC W REG LENS (Ophthalmic Related) ×3 IMPLANT
VISCOELASTIC ADDITIONAL (OPHTHALMIC RELATED) IMPLANT
WATER STERILE IRR 250ML POUR (IV SOLUTION) ×3 IMPLANT

## 2014-07-23 NOTE — Anesthesia Preprocedure Evaluation (Signed)
Anesthesia Evaluation  Patient identified by MRN, date of birth, ID band Patient awake    Reviewed: Allergy & Precautions, H&P , NPO status , Patient's Chart, lab work & pertinent test results  Airway Mallampati: II TM Distance: >3 FB     Dental  (+) Teeth Intact   Pulmonary Current Smoker,  breath sounds clear to auscultation        Cardiovascular negative cardio ROS  Rate:Normal     Neuro/Psych Seizures -, Well Controlled,  PSYCHIATRIC DISORDERS Anxiety Depression  Neuromuscular disease    GI/Hepatic   Endo/Other    Renal/GU      Musculoskeletal   Abdominal   Peds  Hematology   Anesthesia Other Findings   Reproductive/Obstetrics                           Anesthesia Physical Anesthesia Plan  ASA: II  Anesthesia Plan: MAC   Post-op Pain Management:    Induction: Intravenous  Airway Management Planned: Nasal Cannula  Additional Equipment:   Intra-op Plan:   Post-operative Plan:   Informed Consent: I have reviewed the patients History and Physical, chart, labs and discussed the procedure including the risks, benefits and alternatives for the proposed anesthesia with the patient or authorized representative who has indicated his/her understanding and acceptance.     Plan Discussed with:   Anesthesia Plan Comments:         Anesthesia Quick Evaluation

## 2014-07-23 NOTE — Discharge Instructions (Addendum)
Doris DissLinda R Higgins 07/23/2014 Dr. Lita MainsHaines Post operative Instructions for Cataract Patients  These instructions are for Doris DissLinda R Higgins and pertain to the operative eye.  1.  Resume your normal diet and previous oral medicines.  2. Your Follow-up appointment is at Dr. Lita MainsHaines' office in [place] on 07/24/2014 at 9:45 am.  3. You may leave the hospital when your driver is present and your nurse releases you.  4. Begin Pred Forte (prednisolone acetate 1%), Acular LS (ketorolac tromethamine .4%) and Gatifloxacin 0.5% eye drops; 1 drop each 4 times daily to operative eye. Begin 3 hours after discharge from Short Stay Unit.  Moxifloxacin 0.5% may be substituted for Gatifloxacin using the same instructions.  5. Page Dr. Lita MainsHaines via beeper [phone] for significant pain in or around operative eye that is not relieved by Tylenol.  6. If you took Plavix before surgery, restart it at the usual dose on the evening of surgery.  7. Wear dark glasses as necessary for excessive light sensitivity.  8. Do no forcefully rub you your operative eye.  9. Keep your operative eye dry for 1 week. You may gently clean your eyelids with a damp washcloth.  10. You may resume normal occupational activities in one week and resume driving as tolerated after the first post operative visit.  11. It is normal to have blurred vision and a scratchy sensation following surgery.  Dr. Lita MainsHaines: [phone]: [phone]   PATIENT INSTRUCTIONS POST-ANESTHESIA  IMMEDIATELY FOLLOWING SURGERY:  Do not drive or operate machinery for the first twenty four hours after surgery.  Do not make any important decisions for twenty four hours after surgery or while taking narcotic pain medications or sedatives.  If you develop intractable nausea and vomiting or a severe headache please notify your doctor immediately.  FOLLOW-UP:  Please make an appointment with your surgeon as instructed. You do not need to follow up with anesthesia unless specifically  instructed to do so.  WOUND CARE INSTRUCTIONS (if applicable):  Keep a dry clean dressing on the anesthesia/puncture wound site if there is drainage.  Once the wound has quit draining you may leave it open to air.  Generally you should leave the bandage intact for twenty four hours unless there is drainage.  If the epidural site drains for more than 36-48 hours please call the anesthesia department.  QUESTIONS?:  Please feel free to call your physician or the hospital operator if you have any questions, and they will be happy to assist you.

## 2014-07-23 NOTE — H&P (Signed)
I have reviewed the pre printed H&P, the patient was re-examined, and I have identified no significant interval changes in the patient's medical condition.  There is no change in the plan of care since the history and physical of record. 

## 2014-07-23 NOTE — Anesthesia Postprocedure Evaluation (Signed)
  Anesthesia Post-op Note  Patient: Doris Higgins  Procedure(s) Performed: Procedure(s) with comments: CATARACT EXTRACTION PHACO AND INTRAOCULAR LENS PLACEMENT (IOC); CDE 0.37 (Right) - CDE:  0.37  Patient Location: Short Stay  Anesthesia Type:MAC  Level of Consciousness: awake, alert , oriented and patient cooperative  Airway and Oxygen Therapy: Patient Spontanous Breathing  Post-op Pain: none  Post-op Assessment: Post-op Vital signs reviewed, Patient's Cardiovascular Status Stable, Respiratory Function Stable, Patent Airway, No signs of Nausea or vomiting and Pain level controlled  Post-op Vital Signs: Reviewed and stable  Last Vitals:  Filed Vitals:   07/23/14 0730  BP: 103/64  Temp:   Resp: 12    Complications: No apparent anesthesia complications

## 2014-07-23 NOTE — Brief Op Note (Signed)
07/23/2014  8:14 AM  PATIENT:  Doris Higgins  58 y.o. female  PRE-OPERATIVE DIAGNOSIS:  nuclear cataract right eye  POST-OPERATIVE DIAGNOSIS:  nuclear cataract right eye  PROCEDURE:  Procedure(s): CATARACT EXTRACTION PHACO AND INTRAOCULAR LENS PLACEMENT (IOC); CDE 0.37  SURGEON:  Surgeon(s): Susa Simmondsarroll F Shella Lahman, MD  ASSISTANTS:  staff  ANESTHESIA STAFF: Anesthesiologist: Laurene FootmanLuis Gonzalez, MD CRNA: Despina Hiddenobert J Idacavage, CRNA  ANESTHESIA:   topical and MAC  REQUESTED LENS POWER: 21.0  LENS IMPLANT INFORMATION:  [employer] [id number]  [id number]  Exp 04/2018  CUMULATIVE DISSIPATED ENERGY:0.37  INDICATIONS:see surgeon's office H&P  OP FINDINGS:moderately dense NS  COMPLICATIONS:None  DICTATION #: see scanned op noote  PLAN OF CARE: as above  PATIENT DISPOSITION:  Short Stay

## 2014-07-23 NOTE — Transfer of Care (Signed)
Immediate Anesthesia Transfer of Care Note  Patient: Doris Higgins R Kirkman  Procedure(s) Performed: Procedure(s) with comments: CATARACT EXTRACTION PHACO AND INTRAOCULAR LENS PLACEMENT (IOC); CDE 0.37 (Right) - CDE:  0.37  Patient Location: PACU  Anesthesia Type:MAC  Level of Consciousness: awake, alert , oriented and patient cooperative  Airway & Oxygen Therapy: Patient Spontanous Breathing  Post-op Assessment: Report given to PACU RN, Post -op Vital signs reviewed and stable and Patient moving all extremities  Post vital signs: Reviewed and stable  Complications: No apparent anesthesia complications

## 2014-07-23 NOTE — Op Note (Signed)
See scanned op note 

## 2017-07-28 ENCOUNTER — Encounter (HOSPITAL_COMMUNITY): Payer: Self-pay | Admitting: Emergency Medicine

## 2017-07-28 ENCOUNTER — Inpatient Hospital Stay (HOSPITAL_COMMUNITY)
Admission: EM | Admit: 2017-07-28 | Discharge: 2017-07-30 | DRG: 192 | Disposition: A | Discharge status: Home / Self Care | Payer: Medicare Other | Attending: Internal Medicine | Admitting: Internal Medicine

## 2017-07-28 ENCOUNTER — Emergency Department (HOSPITAL_COMMUNITY): Payer: Medicare Other

## 2017-07-28 DIAGNOSIS — E876 Hypokalemia: Secondary | ICD-10-CM | POA: Diagnosis present

## 2017-07-28 DIAGNOSIS — J4551 Severe persistent asthma with (acute) exacerbation: Secondary | ICD-10-CM | POA: Diagnosis not present

## 2017-07-28 DIAGNOSIS — R079 Chest pain, unspecified: Secondary | ICD-10-CM | POA: Diagnosis not present

## 2017-07-28 DIAGNOSIS — G2581 Restless legs syndrome: Secondary | ICD-10-CM | POA: Diagnosis present

## 2017-07-28 DIAGNOSIS — Z90710 Acquired absence of both cervix and uterus: Secondary | ICD-10-CM | POA: Diagnosis not present

## 2017-07-28 DIAGNOSIS — Z9841 Cataract extraction status, right eye: Secondary | ICD-10-CM

## 2017-07-28 DIAGNOSIS — H409 Unspecified glaucoma: Secondary | ICD-10-CM | POA: Diagnosis present

## 2017-07-28 DIAGNOSIS — Z87891 Personal history of nicotine dependence: Secondary | ICD-10-CM | POA: Diagnosis not present

## 2017-07-28 DIAGNOSIS — M1990 Unspecified osteoarthritis, unspecified site: Secondary | ICD-10-CM | POA: Diagnosis not present

## 2017-07-28 DIAGNOSIS — G8929 Other chronic pain: Secondary | ICD-10-CM | POA: Diagnosis present

## 2017-07-28 DIAGNOSIS — Z803 Family history of malignant neoplasm of breast: Secondary | ICD-10-CM | POA: Diagnosis not present

## 2017-07-28 DIAGNOSIS — Z9842 Cataract extraction status, left eye: Secondary | ICD-10-CM | POA: Diagnosis not present

## 2017-07-28 DIAGNOSIS — F329 Major depressive disorder, single episode, unspecified: Secondary | ICD-10-CM | POA: Diagnosis not present

## 2017-07-28 DIAGNOSIS — K219 Gastro-esophageal reflux disease without esophagitis: Secondary | ICD-10-CM | POA: Diagnosis present

## 2017-07-28 DIAGNOSIS — Z801 Family history of malignant neoplasm of trachea, bronchus and lung: Secondary | ICD-10-CM | POA: Diagnosis not present

## 2017-07-28 DIAGNOSIS — Z818 Family history of other mental and behavioral disorders: Secondary | ICD-10-CM

## 2017-07-28 DIAGNOSIS — Z811 Family history of alcohol abuse and dependence: Secondary | ICD-10-CM

## 2017-07-28 DIAGNOSIS — F419 Anxiety disorder, unspecified: Secondary | ICD-10-CM | POA: Diagnosis present

## 2017-07-28 DIAGNOSIS — Z915 Personal history of self-harm: Secondary | ICD-10-CM

## 2017-07-28 DIAGNOSIS — Z961 Presence of intraocular lens: Secondary | ICD-10-CM | POA: Diagnosis present

## 2017-07-28 DIAGNOSIS — M549 Dorsalgia, unspecified: Secondary | ICD-10-CM | POA: Diagnosis not present

## 2017-07-28 DIAGNOSIS — J441 Chronic obstructive pulmonary disease with (acute) exacerbation: Secondary | ICD-10-CM | POA: Diagnosis present

## 2017-07-28 DIAGNOSIS — G40909 Epilepsy, unspecified, not intractable, without status epilepticus: Secondary | ICD-10-CM | POA: Diagnosis not present

## 2017-07-28 HISTORY — DX: Major depressive disorder, single episode, unspecified: F32.9

## 2017-07-28 HISTORY — DX: Unspecified glaucoma: H40.9

## 2017-07-28 HISTORY — DX: Depression, unspecified: F32.A

## 2017-07-28 HISTORY — DX: Personal history of other diseases of the digestive system: Z87.19

## 2017-07-28 HISTORY — DX: Headache, unspecified: R51.9

## 2017-07-28 HISTORY — DX: Pneumonia, unspecified organism: J18.9

## 2017-07-28 HISTORY — DX: Migraine, unspecified, not intractable, without status migrainosus: G43.909

## 2017-07-28 HISTORY — DX: Chronic obstructive pulmonary disease, unspecified: J44.9

## 2017-07-28 HISTORY — DX: Other generalized epilepsy and epileptic syndromes, not intractable, without status epilepticus: G40.409

## 2017-07-28 HISTORY — DX: Restless legs syndrome: G25.81

## 2017-07-28 HISTORY — DX: Headache: R51

## 2017-07-28 LAB — COMPREHENSIVE METABOLIC PANEL
ALK PHOS: 87 U/L (ref 38–126)
ALT: 14 U/L (ref 14–54)
ANION GAP: 8 (ref 5–15)
AST: 24 U/L (ref 15–41)
Albumin: 3.9 g/dL (ref 3.5–5.0)
BILIRUBIN TOTAL: 0.6 mg/dL (ref 0.3–1.2)
BUN: 11 mg/dL (ref 6–20)
CALCIUM: 9 mg/dL (ref 8.9–10.3)
CO2: 29 mmol/L (ref 22–32)
CREATININE: 0.74 mg/dL (ref 0.44–1.00)
Chloride: 103 mmol/L (ref 101–111)
GFR calc non Af Amer: >60 mL/min (ref >60)
Glucose, Bld: 94 mg/dL (ref 65–99)
Potassium: 3 mmol/L — ABNORMAL LOW (ref 3.5–5.1)
Sodium: 140 mmol/L (ref 135–145)
TOTAL PROTEIN: 6.6 g/dL (ref 6.5–8.1)

## 2017-07-28 LAB — CBC WITH DIFFERENTIAL/PLATELET
BASOS ABS: 0.1 10*3/uL (ref 0.0–0.1)
BASOS PCT: 1 %
EOS ABS: 0.3 10*3/uL (ref 0.0–0.7)
Eosinophils Relative: 3 %
HEMATOCRIT: 37 % (ref 36.0–46.0)
HEMOGLOBIN: 12.1 g/dL (ref 12.0–15.0)
Lymphocytes Relative: 43 %
Lymphs Abs: 4.5 10*3/uL — ABNORMAL HIGH (ref 0.7–4.0)
MCH: 28.3 pg (ref 26.0–34.0)
MCHC: 32.7 g/dL (ref 30.0–36.0)
MCV: 86.4 fL (ref 78.0–100.0)
MONO ABS: 0.8 10*3/uL (ref 0.1–1.0)
Monocytes Relative: 8 %
NEUTROS ABS: 4.9 10*3/uL (ref 1.7–7.7)
NEUTROS PCT: 45 %
Platelets: 256 10*3/uL (ref 150–400)
RBC: 4.28 MIL/uL (ref 3.87–5.11)
RDW: 14.9 % (ref 11.5–15.5)
WBC: 10.4 10*3/uL (ref 4.0–10.5)

## 2017-07-28 MED ORDER — DEXTROSE 5 % IV SOLN
500.0000 mg | Freq: Once | INTRAVENOUS | Status: AC
  Administered 2017-07-28: 500 mg via INTRAVENOUS
  Filled 2017-07-28: qty 500

## 2017-07-28 MED ORDER — ALBUTEROL (5 MG/ML) CONTINUOUS INHALATION SOLN
15.0000 mg/h | INHALATION_SOLUTION | Freq: Once | RESPIRATORY_TRACT | Status: AC
  Administered 2017-07-28: 15 mg/h via RESPIRATORY_TRACT
  Filled 2017-07-28: qty 20

## 2017-07-28 NOTE — ED Triage Notes (Signed)
Patient arrives from home with shortness of breath and increased cough over the past few days. History of smoking and lung CA. Has been using her inhaler all day without relief. EMS was called tonight. They administered 10mg  albuterol, 1mg  atrovent, and 125mg  solu-medrol PTA. They noted patient to have wheezing in all fields, with 94% saturation on RA upon their arrival.

## 2017-07-28 NOTE — ED Triage Notes (Signed)
Correction to previous triage note: EMS reported to this RN that patient had history of Lung CA. Patient denies that history, but states that her family has significant history of lung cancers.

## 2017-07-28 NOTE — H&P (Addendum)
Doris Higgins MWU:[id number] DOB: 1955/11/02 DOA: 07/28/2017     PCP: Fleet Contras, MD   Outpatient Specialists: none  Patient coming from:   home Lives with sons    Chief Complaint: shortness of breath  HPI: Doris Higgins is a 61 y.o. female with medical history significant of seizure disorder and GERD    Presented with progressive Shortness of breath and wheezing today she tried to use albuterol with no relief. No fever. She has had some cough productive of yellow gray sputum. She reports her chest hurting when wheezing gotten worse.  It does not hurt to taka deep breath. Able to speak in full sentences. Wheezing better.  She quit smoking 2 years ago.   Never told her doctors that she was short of breath progressively because she was afraid they would diagnose her with COPD.   No recent travel no leg swelling no hx of lung cancer.  Regarding pertinent Chronic problems: hx of seizure do self discontinued Keppra years ago because Her last seizure was 2013 has been seizure free since.    IN ER:  Temp (24hrs), Avg:98 F (36.7 C), Min:98 F (36.7 C), Max:98 F (36.7 C)      on arrival  ED Triage Vitals  Enc Vitals Group     BP 07/28/17 2112 132/76     Pulse Rate 07/28/17 2112 77     Resp 07/28/17 2112 (!) 24     Temp 07/28/17 2112 98 F (36.7 C)     Temp Source 07/28/17 2112 Oral     SpO2 07/28/17 2108 100 %     Weight 07/28/17 2113 108 lb (49 kg)     Height 07/28/17 2113 5\' 6"  (1.676 m)     Head Circumference --      Peak Flow --      Pain Score 07/28/17 2114 5     Pain Loc --      Pain Edu? --      Excl. in GC? --     Latest RR 17 100% Bp 122/67  Following Medications were ordered in ER: Medications  azithromycin (ZITHROMAX) 500 mg in dextrose 5 % 250 mL IVPB (500 mg Intravenous New Bag/Given 07/28/17 2242)  albuterol (PROVENTIL,VENTOLIN) solution continuous neb (15 mg/hr Nebulization Given 07/28/17 2125)     Hospitalist was called for admission for  reactive airway disease  Review of Systems:    Pertinent positives include: fatigue, chest pain,  shortness of breath at rest.   dyspnea on exertion, productive cough,  Constitutional:  No weight loss, night sweats, Fevers, chills,  weight loss  HEENT:  No headaches, Difficulty swallowing,Tooth/dental problems,Sore throat,  No sneezing, itching, ear ache, nasal congestion, post nasal drip,  Cardio-vascular:  No Orthopnea, PND, anasarca, dizziness, palpitations.no Bilateral lower extremity swelling  GI:  No heartburn, indigestion, abdominal pain, nausea, vomiting, diarrhea, change in bowel habits, loss of appetite, melena, blood in stool, hematemesis Resp:  no  No excess mucus, no No non-productive cough, No coughing up of blood.No change in color of mucus.No wheezing. Skin:  no rash or lesions. No jaundice GU:  no dysuria, change in color of urine, no urgency or frequency. No straining to urinate.  No flank pain.  Musculoskeletal:  No joint pain or no joint swelling. No decreased range of motion. No back pain.  Psych:  No change in mood or affect. No depression or anxiety. No memory loss.  Neuro: no localizing neurological complaints, no tingling, no weakness,  no double vision, no gait abnormality, no slurred speech, no confusion  As per HPI otherwise 10 point review of systems negative.   Past Medical History: Past Medical History:  Diagnosis Date  . Anxiety   . Arthritis   . Chronic back pain   . Glaucoma   . Menopause   . Seizures (HCC)    since 2010-last one was 6 months ago- on Keppra- unknown orgin  . Suicide and self-inflicted injury [facility])    Past Surgical History:  Procedure Laterality Date  . ABDOMINAL HYSTERECTOMY     2000 and oophorectonmy 2007 for mass which was found to be non -malignant per patient report  . CATARACT EXTRACTION W/PHACO Left 03/12/2014   Procedure: CATARACT EXTRACTION PHACO AND INTRAOCULAR LENS PLACEMENT (IOC);  Surgeon: Gemma Payor, MD;   Location: [facility];  Service: Ophthalmology;  Laterality: Left;  CDE:7.05  . CATARACT EXTRACTION W/PHACO Right 07/23/2014   Procedure: CATARACT EXTRACTION PHACO AND INTRAOCULAR LENS PLACEMENT (IOC); CDE 0.37;  Surgeon: Susa Simmonds, MD;  Location: [facility];  Service: Ophthalmology;  Laterality: Right;  CDE:  0.37  . TUBAL LIGATION       Social History:  Ambulatory   Independently    reports that she quit smoking about 2 years ago. Her smoking use included Cigarettes. She has a 10.00 pack-year smoking history. She has never used smokeless tobacco. She reports that she does not drink alcohol or use drugs.  Allergies:  No Known Allergies     Family History:   Family History  Problem Relation Age of Onset  . Alcohol abuse Mother   . Depression Mother   . Alcohol abuse Father   . Cancer Father        lung ca  . Cancer Maternal Aunt        breast  . Cancer Paternal Aunt        breast    Medications: Prior to Admission medications   Medication Sig Start Date End Date Taking? Authorizing Provider  albuterol (PROVENTIL HFA;VENTOLIN HFA) 108 (90 BASE) MCG/ACT inhaler Inhale 2 puffs into the lungs every 6 (six) hours as needed for wheezing or shortness of breath.   Yes [provider]  B Complex-C (B-COMPLEX WITH VITAMIN C) tablet Take 1 tablet by mouth daily.   Yes [provider]  citalopram (CELEXA) 20 MG tablet Take 20 mg by mouth daily.   Yes [provider]  gabapentin (NEURONTIN) 600 MG tablet Take 600 mg by mouth 2 (two) times daily.    Yes [provider]  hydrOXYzine (ATARAX/VISTARIL) 10 MG tablet Take 10 mg by mouth at bedtime as needed (sleep).    Yes [provider]  Multiple Vitamin (MULTIVITAMIN WITH MINERALS) TABS tablet Take 1 tablet by mouth daily.   Yes [provider]  naproxen (NAPROSYN) 500 MG tablet Take 500 mg by mouth 2 (two) times daily.   Yes [provider]  pramipexole (MIRAPEX) 0.25 MG tablet  Take 0.25 mg by mouth at bedtime.   Yes [provider]  tiZANidine (ZANAFLEX) 4 MG tablet Take 4 mg by mouth every 6 (six) hours as needed for muscle spasms.    Yes [provider]  vitamin C (ASCORBIC ACID) 500 MG tablet Take 500 mg by mouth daily.   Yes [provider]    Physical Exam: Patient Vitals for the past 24 hrs:  BP Temp Temp src Pulse Resp SpO2 Height Weight  07/28/17 2125 - - - - -  100 % - -  07/28/17 2115 122/67 - - 80 17 100 % - -  07/28/17 2114 - - - - - 100 % - -  07/28/17 2113 - - - - - - 5\' 6"  (1.676 m) 49 kg (108 lb)  07/28/17 2112 132/76 98 F (36.7 C) Oral 77 (!) 24 100 % - -  07/28/17 2108 - - - - - 100 % - -    1. General:  in No Acute distress   Chronically ill  -appearing 2. Psychological: Alert and Oriented 3. Head/ENT:   Dry Mucous Membranes                          Head Non traumatic, neck supple                          Normal  Dentition 4. SKIN: decreased Skin turgor,  Skin clean Dry and intact no rash 5. Heart: Regular rate and rhythm no Murmur, no Rub or gallop 6. Lungs:  some wheezes bilaterally with overall decreased air movement 7. Abdomen: Soft,  non-tender, Non distended  bowel sounds present 8. Lower extremities: no clubbing, cyanosis, or edema 9. Neurologically Grossly intact, moving all 4 extremities equally   10. MSK: Normal range of motion   body mass index is 17.43 kg/m.  Labs on Admission:   Labs on Admission: I have personally reviewed following labs and imaging studies  CBC:  Recent Labs Lab 07/28/17 2117  WBC 10.4  NEUTROABS 4.9  HGB 12.1  HCT 37.0  MCV 86.4  PLT 256   Basic Metabolic Panel:  Recent Labs Lab 07/28/17 2117  NA 140  K 3.0*  CL 103  CO2 29  GLUCOSE 94  BUN 11  CREATININE 0.74  CALCIUM 9.0   GFR: Estimated Creatinine Clearance: 58.6 mL/min (by C-G formula based on SCr of 0.74 mg/dL). Liver Function Tests:  Recent Labs Lab 07/28/17 2117  AST 24  ALT 14    ALKPHOS 87  BILITOT 0.6  PROT 6.6  ALBUMIN 3.9   No results for input(s): LIPASE, AMYLASE in the last 168 hours. No results for input(s): AMMONIA in the last 168 hours. Coagulation Profile: No results for input(s): INR, PROTIME in the last 168 hours. Cardiac Enzymes: No results for input(s): CKTOTAL, CKMB, CKMBINDEX, TROPONINI in the last 168 hours. BNP (last 3 results) No results for input(s): PROBNP in the last 8760 hours. HbA1C: No results for input(s): HGBA1C in the last 72 hours. CBG: No results for input(s): GLUCAP in the last 168 hours. Lipid Profile: No results for input(s): CHOL, HDL, LDLCALC, TRIG, CHOLHDL, LDLDIRECT in the last 72 hours. Thyroid Function Tests: No results for input(s): TSH, T4TOTAL, FREET4, T3FREE, THYROIDAB in the last 72 hours. Anemia Panel: No results for input(s): VITAMINB12, FOLATE, FERRITIN, TIBC, IRON, RETICCTPCT in the last 72 hours. Urine analysis:  Sepsis Labs: @LABRCNTIP (procalcitonin:4,lacticidven:4) )No results found for this or any previous visit (from the past 240 hour(s)).   UA  not ordered  No results found for: HGBA1C  Estimated Creatinine Clearance: 58.6 mL/min (by C-G formula based on SCr of 0.74 mg/dL).  BNP (last 3 results) No results for input(s): PROBNP in the last 8760 hours.   ECG REPORT  Independently reviewed Rate:78  Rhythm: RBBB ST&T Change: No acute ischemic changes   QTC  462  Filed Weights   07/28/17 2113  Weight: 49 kg (108 lb)  Cultures:    Component Value Date/Time   SDES URINE, CLEAN CATCH 03/27/2010 0024   SPECREQUEST NONE 03/27/2010 0024   CULT NO GROWTH 03/27/2010 0024   REPTSTATUS 03/29/2010 FINAL 03/27/2010 0024     Radiological Exams on Admission: Dg Chest Portable 1 View  Result Date: 07/28/2017 CLINICAL DATA:  Cough and shortness of Breath EXAM: PORTABLE CHEST 1 VIEW COMPARISON:  03/06/2010 FINDINGS: Cardiac shadow is within normal limits. The lungs are well aerated bilaterally.  Minimal left basilar atelectasis is seen. No focal confluent infiltrate or sizable effusion is noted. IMPRESSION: Minimal left basilar atelectasis. Electronically Signed   By: Alcide Clever M.D.   On: 07/28/2017 21:31    Chart has been reviewed    Assessment/Plan  61 y.o. female with medical history significant of seizure disorder and GERD  Admitted for COPD exacerbation  Present on Admission: . COPD with acute exacerbation (HCC)versus reactive airway disease unspecified:  -  - Will initiate: Steroid taper  -  Antibiotics  Doxycycline, -  XopenexPRN, - scheduled duoneb,  -  Will need PFTs once stable   Titrate O2 to saturation >90%. Follow patients respiratory status.  Order respiratory panel and influenza PCR    Currently mentating well no evidence of symptomatic hypercarbia   . Hypokalemia - - will replace and repeat in AM,  check magnesium level and replace as needed  History of seizure disorder currently not on medications patient will need to follow-up with neurology to clarify if she needs to be restarted given that she has tolerated being off of seizure medications for the past 2 years. It's unclear if her prior seizures were unprovoked.  Chest pain given risk factors will cycle cardiac enzymes and monitor on telemetry obtain d-dimer  Other plan as per orders.  DVT prophylaxis:   Lovenox     Code Status:  FULL CODE   as per patient     Family Communication:   Family not  at  Bedside     Disposition Plan:     To home once workup is complete and patient is stable                              Consults called: none   Admission status:  inpatient     Level of care     tele        I have spent a total of 56 min on this admission    Rayleen Wyrick 07/29/2017, 12:42 AM    [facility]  Pager [phone]   after 2 AM please page floor coverage PA If 7AM-7PM, please contact the day team taking care of the patient  [email or web address]  Password [email or web address]
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
## 2017-07-29 ENCOUNTER — Encounter (HOSPITAL_COMMUNITY): Payer: Self-pay | Admitting: General Practice

## 2017-07-29 DIAGNOSIS — H409 Unspecified glaucoma: Secondary | ICD-10-CM | POA: Diagnosis not present

## 2017-07-29 DIAGNOSIS — M549 Dorsalgia, unspecified: Secondary | ICD-10-CM | POA: Diagnosis not present

## 2017-07-29 DIAGNOSIS — G8929 Other chronic pain: Secondary | ICD-10-CM | POA: Diagnosis not present

## 2017-07-29 DIAGNOSIS — J441 Chronic obstructive pulmonary disease with (acute) exacerbation: Secondary | ICD-10-CM | POA: Diagnosis not present

## 2017-07-29 LAB — COMPREHENSIVE METABOLIC PANEL
ALK PHOS: 75 U/L (ref 38–126)
ALT: 14 U/L (ref 14–54)
AST: 29 U/L (ref 15–41)
Albumin: 3.8 g/dL (ref 3.5–5.0)
Anion gap: 13 (ref 5–15)
BILIRUBIN TOTAL: 0.5 mg/dL (ref 0.3–1.2)
BUN: 16 mg/dL (ref 6–20)
CALCIUM: 9 mg/dL (ref 8.9–10.3)
CO2: 23 mmol/L (ref 22–32)
CREATININE: 0.81 mg/dL (ref 0.44–1.00)
Chloride: 104 mmol/L (ref 101–111)
GFR calc Af Amer: >60 mL/min (ref >60)
Glucose, Bld: 202 mg/dL — ABNORMAL HIGH (ref 65–99)
Potassium: 3 mmol/L — ABNORMAL LOW (ref 3.5–5.1)
Sodium: 140 mmol/L (ref 135–145)
TOTAL PROTEIN: 6.5 g/dL (ref 6.5–8.1)

## 2017-07-29 LAB — RESPIRATORY PANEL BY PCR
ADENOVIRUS-RVPPCR: NOT DETECTED
Bordetella pertussis: NOT DETECTED
CHLAMYDOPHILA PNEUMONIAE-RVPPCR: NOT DETECTED
CORONAVIRUS HKU1-RVPPCR: NOT DETECTED
CORONAVIRUS NL63-RVPPCR: NOT DETECTED
Coronavirus 229E: NOT DETECTED
Coronavirus OC43: NOT DETECTED
INFLUENZA A-RVPPCR: NOT DETECTED
Influenza B: NOT DETECTED
MYCOPLASMA PNEUMONIAE-RVPPCR: NOT DETECTED
Metapneumovirus: NOT DETECTED
Parainfluenza Virus 1: NOT DETECTED
Parainfluenza Virus 2: NOT DETECTED
Parainfluenza Virus 3: NOT DETECTED
Parainfluenza Virus 4: NOT DETECTED
Respiratory Syncytial Virus: NOT DETECTED
Rhinovirus / Enterovirus: NOT DETECTED

## 2017-07-29 LAB — D-DIMER, QUANTITATIVE (NOT AT ARMC): D DIMER QUANT: 0.4 ug{FEU}/mL (ref 0.00–0.50)

## 2017-07-29 LAB — CBC
HCT: 35 % — ABNORMAL LOW (ref 36.0–46.0)
Hemoglobin: 11.4 g/dL — ABNORMAL LOW (ref 12.0–15.0)
MCH: 28.2 pg (ref 26.0–34.0)
MCHC: 32.6 g/dL (ref 30.0–36.0)
MCV: 86.6 fL (ref 78.0–100.0)
PLATELETS: 248 10*3/uL (ref 150–400)
RBC: 4.04 MIL/uL (ref 3.87–5.11)
RDW: 14.7 % (ref 11.5–15.5)
WBC: 9.6 10*3/uL (ref 4.0–10.5)

## 2017-07-29 LAB — MAGNESIUM: Magnesium: 1.8 mg/dL (ref 1.7–2.4)

## 2017-07-29 LAB — TROPONIN I: Troponin I: <0.03 ng/mL (ref <0.03)

## 2017-07-29 LAB — TSH: TSH: 0.593 u[IU]/mL (ref 0.350–4.500)

## 2017-07-29 LAB — INFLUENZA PANEL BY PCR (TYPE A & B)
Influenza A By PCR: NEGATIVE
Influenza B By PCR: NEGATIVE

## 2017-07-29 LAB — HIV ANTIBODY (ROUTINE TESTING W REFLEX): HIV Screen 4th Generation wRfx: NONREACTIVE

## 2017-07-29 LAB — PHOSPHORUS: PHOSPHORUS: 2.9 mg/dL (ref 2.5–4.6)

## 2017-07-29 MED ORDER — NAPROXEN 250 MG PO TABS
500.0000 mg | ORAL_TABLET | Freq: Two times a day (BID) | ORAL | Status: DC | PRN
  Filled 2017-07-29: qty 2

## 2017-07-29 MED ORDER — ACETAMINOPHEN 650 MG RE SUPP: 650.0000 mg | Freq: Four times a day (QID) | RECTAL | Status: DC | PRN

## 2017-07-29 MED ORDER — TIZANIDINE HCL 4 MG PO TABS: 4.0000 mg | ORAL_TABLET | Freq: Four times a day (QID) | ORAL | Status: DC | PRN

## 2017-07-29 MED ORDER — HYDROXYZINE HCL 10 MG PO TABS
10.0000 mg | ORAL_TABLET | Freq: Every evening | ORAL | Status: DC | PRN
  Filled 2017-07-29: qty 1

## 2017-07-29 MED ORDER — PREDNISONE 20 MG PO TABS
40.0000 mg | ORAL_TABLET | Freq: Every day | ORAL | Status: DC
  Administered 2017-07-30: 40 mg via ORAL
  Filled 2017-07-29: qty 2

## 2017-07-29 MED ORDER — CITALOPRAM HYDROBROMIDE 20 MG PO TABS
20.0000 mg | ORAL_TABLET | Freq: Every day | ORAL | Status: DC
  Administered 2017-07-29 – 2017-07-30 (×2): 20 mg via ORAL
  Filled 2017-07-29 (×2): qty 1

## 2017-07-29 MED ORDER — TRAMADOL HCL 50 MG PO TABS
100.0000 mg | ORAL_TABLET | Freq: Three times a day (TID) | ORAL | Status: DC | PRN
  Administered 2017-07-29 – 2017-07-30 (×2): 100 mg via ORAL
  Filled 2017-07-29 (×3): qty 2

## 2017-07-29 MED ORDER — LEVALBUTEROL HCL 1.25 MG/0.5ML IN NEBU: 1.2500 mg | INHALATION_SOLUTION | RESPIRATORY_TRACT | Status: DC | PRN

## 2017-07-29 MED ORDER — ACETAMINOPHEN 325 MG PO TABS: 650.0000 mg | ORAL_TABLET | Freq: Four times a day (QID) | ORAL | Status: DC | PRN

## 2017-07-29 MED ORDER — ONDANSETRON HCL 4 MG/2ML IJ SOLN: 4.0000 mg | Freq: Four times a day (QID) | INTRAMUSCULAR | Status: DC | PRN

## 2017-07-29 MED ORDER — ENOXAPARIN SODIUM 40 MG/0.4ML ~~LOC~~ SOLN
40.0000 mg | SUBCUTANEOUS | Status: DC
  Administered 2017-07-29: 40 mg via SUBCUTANEOUS
  Filled 2017-07-29 (×2): qty 0.4

## 2017-07-29 MED ORDER — ONDANSETRON HCL 4 MG PO TABS: 4.0000 mg | ORAL_TABLET | Freq: Four times a day (QID) | ORAL | Status: DC | PRN

## 2017-07-29 MED ORDER — DOXYCYCLINE HYCLATE 100 MG PO TABS
100.0000 mg | ORAL_TABLET | Freq: Two times a day (BID) | ORAL | Status: DC
  Administered 2017-07-29 – 2017-07-30 (×2): 100 mg via ORAL
  Filled 2017-07-29 (×2): qty 1

## 2017-07-29 MED ORDER — DOXYCYCLINE HYCLATE 100 MG IV SOLR
100.0000 mg | Freq: Two times a day (BID) | INTRAVENOUS | Status: DC
  Administered 2017-07-29 (×2): 100 mg via INTRAVENOUS
  Filled 2017-07-29 (×3): qty 100

## 2017-07-29 MED ORDER — IPRATROPIUM-ALBUTEROL 0.5-2.5 (3) MG/3ML IN SOLN
3.0000 mL | Freq: Four times a day (QID) | RESPIRATORY_TRACT | Status: DC
  Administered 2017-07-29: 3 mL via RESPIRATORY_TRACT
  Filled 2017-07-29: qty 3

## 2017-07-29 MED ORDER — METHYLPREDNISOLONE SODIUM SUCC 125 MG IJ SOLR
60.0000 mg | Freq: Two times a day (BID) | INTRAMUSCULAR | Status: AC
  Administered 2017-07-29 (×2): 60 mg via INTRAVENOUS
  Filled 2017-07-29 (×2): qty 2

## 2017-07-29 MED ORDER — POTASSIUM CHLORIDE 10 MEQ/100ML IV SOLN
10.0000 meq | INTRAVENOUS | Status: AC
  Administered 2017-07-29 (×4): 10 meq via INTRAVENOUS
  Filled 2017-07-29 (×4): qty 100

## 2017-07-29 MED ORDER — NAPROXEN 250 MG PO TABS
500.0000 mg | ORAL_TABLET | Freq: Two times a day (BID) | ORAL | Status: DC
  Administered 2017-07-29 – 2017-07-30 (×3): 500 mg via ORAL
  Filled 2017-07-29 (×2): qty 2

## 2017-07-29 MED ORDER — POTASSIUM CHLORIDE CRYS ER 20 MEQ PO TBCR
40.0000 meq | EXTENDED_RELEASE_TABLET | Freq: Once | ORAL | Status: AC
  Administered 2017-07-29: 40 meq via ORAL
  Filled 2017-07-29: qty 2

## 2017-07-29 MED ORDER — IPRATROPIUM-ALBUTEROL 0.5-2.5 (3) MG/3ML IN SOLN: 3.0000 mL | RESPIRATORY_TRACT | Status: DC | PRN

## 2017-07-29 MED ORDER — TRAMADOL HCL 50 MG PO TABS
50.0000 mg | ORAL_TABLET | Freq: Three times a day (TID) | ORAL | Status: DC | PRN
  Administered 2017-07-29: 50 mg via ORAL

## 2017-07-29 MED ORDER — TRAMADOL HCL 50 MG PO TABS
50.0000 mg | ORAL_TABLET | Freq: Two times a day (BID) | ORAL | Status: DC | PRN
  Filled 2017-07-29: qty 1

## 2017-07-29 MED ORDER — PRAMIPEXOLE DIHYDROCHLORIDE 0.25 MG PO TABS
0.2500 mg | ORAL_TABLET | Freq: Every day | ORAL | Status: DC
  Administered 2017-07-29: 0.25 mg via ORAL
  Filled 2017-07-29: qty 1

## 2017-07-29 MED ORDER — SODIUM CHLORIDE 0.9 % IV SOLN
INTRAVENOUS | Status: DC
  Administered 2017-07-29: 04:00:00 via INTRAVENOUS

## 2017-07-29 MED ORDER — OSELTAMIVIR PHOSPHATE 30 MG PO CAPS
30.0000 mg | ORAL_CAPSULE | Freq: Two times a day (BID) | ORAL | Status: DC
  Administered 2017-07-29: 30 mg via ORAL
  Filled 2017-07-29: qty 1

## 2017-07-29 MED ORDER — GABAPENTIN 600 MG PO TABS
600.0000 mg | ORAL_TABLET | Freq: Two times a day (BID) | ORAL | Status: DC
  Administered 2017-07-29 – 2017-07-30 (×3): 600 mg via ORAL
  Filled 2017-07-29 (×3): qty 1

## 2017-07-29 NOTE — Progress Notes (Signed)
PROGRESS NOTE    Doris Higgins  ZOX:[id number] DOB: 1956/07/03 DOA: 07/28/2017 PCP: Fleet Contras, MD     Brief Narrative:  Doris Higgins is a 61 yo with past medical history seizure disorder, not currently on antiepileptic, GERD, 40 year tobacco abuse history. She presents with 6 month history of progressive shortness of breath that has acutely worsened in the past 2 days. She also admits to worsening cough from her baseline, producing white and yellow sputum. She also admits to chest tightness, worsening with cough and shortness of breath. She was admitted fPD exacerbation.  Assessment & Plan:   Active Problems:   COPD with acute exacerbation (HCC)   Hypokalemia   Seizure disorder (HCC)   COPD exacerbation (HCC)   COPD exacerbation versus reactive airway disease -Has not previously been diagnosed with COPD, however, patient has a 40-pack-year history of tobacco abuse -Would recommend PFT as outpatient -Respiratory PCR pending, we will empirically treat with Tamiflu until results available -Slowly improving with steroids, breathing treatments, doxycycline   Hypokalemia -Replac, trend  Atypical chest pain -Chest tightness a/w deep breath, cough, dyspea -Troponin x 3 negative  -D dimer negative   Depression -Continue Celexa  Chronic pain from arthritis  -Continue home naproxen, tramadol, gabapentin, zanaflex   Restless leg -Continue home pramipexole  History of seizure disorder -Has now stopped antiepileptics on her own. No breakthrough seizures.   DVT prophylaxis: lovenox Code Status: Full Family Communication: no family at bedside Disposition Plan: pending improvement and wean from O2. Home tomorrow if stable    Consultants:   none  Procedures:   none  Antimicrobials:  Anti-infectives    Start     Dose/Rate Route Frequency Ordered Stop   07/29/17 2200  doxycycline (VIBRA-TABS) tablet 100 mg     100 mg Oral Every 12 hours 07/29/17 1153     07/29/17 1000  oseltamivir (TAMIFLU) capsule 30 mg     30 mg Oral 2 times daily 07/29/17 0742 08/03/17 0959   07/29/17 0315  doxycycline (VIBRAMYCIN) 100 mg in dextrose 5 % 250 mL IVPB  Status:  Discontinued     100 mg 125 mL/hr over 120 Minutes Intravenous Every 12 hours 07/29/17 0257 07/29/17 1153   07/28/17 2230  azithromycin (ZITHROMAX) 500 mg in dextrose 5 % 250 mL IVPB     500 mg 250 mL/hr over 60 Minutes Intravenous  Once 07/28/17 2220 07/28/17 2342       Subjective: Continues to have some shortness of breath, which has slowly improved. Continues to have productive cough associated with chest tightness. Denies any fevers, has some subjective chills, no increase in muscle aches or pain from her baseline. Denies any nausea, vomiting, diarrhea, abdominal pain   Objective: Vitals:   07/29/17 0215 07/29/17 0228 07/29/17 0254 07/29/17 0752  BP: (!) 107/58  (!) 121/58   Pulse: 85  86   Resp: 17  20   Temp:  97.9 F (36.6 C) 98 F (36.7 C)   TempSrc:   Oral   SpO2: 97%  100% 94%  Weight:      Height:        Intake/Output Summary (Last 24 hours) at 07/29/17 1156 Last data filed at 07/29/17 0545  Gross per 24 hour  Intake           726.25 ml  Output                0 ml  Net  726.25 ml   Filed Weights   07/28/17 2113  Weight: 49 kg (108 lb)    Examination:  General exam: Appears calm and comfortable  Respiratory system: Diminished breath sounds bilaterally. Respiratory effort normal. +cough throughout conversation  Cardiovascular system: S1 & S2 heard, RRR. No JVD, murmurs, rubs, gallops or clicks. No pedal edema. Gastrointestinal system: Abdomen is nondistended, soft and nontender. No organomegaly or masses felt. Normal bowel sounds heard. Central nervous system: Alert and oriented. No focal neurological deficits. Extremities: Symmetric 5 x 5 power. Skin: No rashes, lesions or ulcers Psychiatry: Judgement and insight appear normal. Mood & affect appropriate.     Data Reviewed: I have personally reviewed following labs and imaging studies  CBC:  Recent Labs Lab 07/28/17 2117 07/29/17 0416  WBC 10.4 9.6  NEUTROABS 4.9  --   HGB 12.1 11.4*  HCT 37.0 35.0*  MCV 86.4 86.6  PLT 256 248   Basic Metabolic Panel:  Recent Labs Lab 07/28/17 2117 07/29/17 0416  NA 140 140  K 3.0* 3.0*  CL 103 104  CO2 29 23  GLUCOSE 94 202*  BUN 11 16  CREATININE 0.74 0.81  CALCIUM 9.0 9.0  MG  --  1.8  PHOS  --  2.9   GFR: Estimated Creatinine Clearance: 57.8 mL/min (by C-G formula based on SCr of 0.81 mg/dL). Liver Function Tests:  Recent Labs Lab 07/28/17 2117 07/29/17 0416  AST 24 29  ALT 14 14  ALKPHOS 87 75  BILITOT 0.6 0.5  PROT 6.6 6.5  ALBUMIN 3.9 3.8   No results for input(s): LIPASE, AMYLASE in the last 168 hours. No results for input(s): AMMONIA in the last 168 hours. Coagulation Profile: No results for input(s): INR, PROTIME in the last 168 hours. Cardiac Enzymes:  Recent Labs Lab 07/29/17 0416 07/29/17 0813 07/29/17 0907  TROPONINI <0.03 <0.03 <0.03   BNP (last 3 results) No results for input(s): PROBNP in the last 8760 hours. HbA1C: No results for input(s): HGBA1C in the last 72 hours. CBG: No results for input(s): GLUCAP in the last 168 hours. Lipid Profile: No results for input(s): CHOL, HDL, LDLCALC, TRIG, CHOLHDL, LDLDIRECT in the last 72 hours. Thyroid Function Tests:  Recent Labs  07/29/17 0416  TSH 0.593   Anemia Panel: No results for input(s): VITAMINB12, FOLATE, FERRITIN, TIBC, IRON, RETICCTPCT in the last 72 hours. Sepsis Labs: No results for input(s): PROCALCITON, LATICACIDVEN in the last 168 hours.  No results found for this or any previous visit (from the past 240 hour(s)).     Radiology Studies: Dg Chest Portable 1 View  Result Date: 07/28/2017 CLINICAL DATA:  Cough and shortness of Breath EXAM: PORTABLE CHEST 1 VIEW COMPARISON:  03/06/2010 FINDINGS: Cardiac shadow is within normal  limits. The lungs are well aerated bilaterally. Minimal left basilar atelectasis is seen. No focal confluent infiltrate or sizable effusion is noted. IMPRESSION: Minimal left basilar atelectasis. Electronically Signed   By: Alcide CleverMark  Lukens M.D.   On: 07/28/2017 21:31      Scheduled Meds: . citalopram  20 mg Oral Daily  . doxycycline  100 mg Oral Q12H  . enoxaparin (LOVENOX) injection  40 mg Subcutaneous Q24H  . gabapentin  600 mg Oral BID  . methylPREDNISolone (SOLU-MEDROL) injection  60 mg Intravenous Q12H   Followed by  . [START ON 07/30/2017] predniSONE  40 mg Oral Q supper  . naproxen  500 mg Oral BID  . oseltamivir  30 mg Oral BID  . pramipexole  0.25  mg Oral QHS   Continuous Infusions:    LOS: 1 day    Time spent: 40 minutes   Noralee Stain, DO [facility] [email or web address] Password [email or web address] 07/29/2017, 11:56 AM

## 2017-07-29 NOTE — Progress Notes (Signed)
Patient received from ED via bed. Vital signs are stable. Patient is alert and oriented x 4. Skin assessment done with another nurse found intact. Patient given instruction about call bell, phone and unit routine. Bed in low position and side rail up x2.

## 2017-07-29 NOTE — Progress Notes (Signed)
PHARMACY NOTE:  ANTIMICROBIAL RENAL DOSAGE ADJUSTMENT  Current antimicrobial regimen includes a mismatch between antimicrobial dosage and estimated renal function.  As per policy approved by the Pharmacy & Therapeutics and Medical Executive Committees, the antimicrobial dosage will be adjusted accordingly.  Current antimicrobial dosage:  Tamiflu 75mg  BID  Indication: suspected influenza  Renal Function:  Estimated Creatinine Clearance: 57.8 mL/min (by C-G formula based on SCr of 0.81 mg/dL). []      On intermittent HD, scheduled: []      On CRRT    Antimicrobial dosage has been changed to:  30mg  BID    Thank you for allowing pharmacy to be a part of this patient's care.  Vernard GamblesVeronda Monick Rena, PharmD, BCPS  07/29/2017 7:44 AM

## 2017-07-29 NOTE — Progress Notes (Signed)
Nutrition Brief Note  RD consulted for assessment of nutritional requirements/status.   Wt Readings from Last 15 Encounters:  07/28/17 108 lb (49 kg)  07/23/14 105 lb (47.6 kg)  03/07/14 117 lb (53.1 kg)  11/27/13 110 lb (49.9 kg)  07/24/13 110 lb (49.9 kg)  12/24/10 106 lb 3.2 oz (48.2 kg)  08/18/10 110 lb 14.4 oz (50.3 kg)  07/15/10 114 lb 12.8 oz (52.1 kg)  07/02/10 105 lb 4.8 oz (47.8 kg)  05/15/10 100 lb (45.4 kg)  04/29/10 (!) 99 lb (44.9 kg)  04/25/10 101 lb 8 oz (46 kg)  04/04/10 102 lb 1.6 oz (46.3 kg)  03/04/10 100 lb 1.6 oz (45.4 kg)  01/15/10 99 lb 12.8 oz (45.3 kg)   61 y.o. female with medical history significant of seizure disorder and GERD  Admitted for COPD exacerbation  Pt admitted with COPD exacerbation.   Pt reports that she is very particular about what she eats to maintain her weight. She also shares that she is often limited with what she can eat towards the end of the month, based on funds on her EBT card. She tries to eat healthfully (lean protein, fruits, and vegetables). She typically consumes one large meal per day (salad with grilled chicken) and snacks on fruit throughout the day. She shares her UBW is around 110#. 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   Nutrition-Focused physical exam completed. Findings are no fat depletion, no muscle depletion, and no edema. Pt is very small framed.   Body mass index is 17.43 kg/m. Patient meets criteria for underweight based on current BMI.   Current diet order is regular, patient is consuming approximately 75% of meals at this time. Labs and medications reviewed.   No nutrition interventions warranted at this time. If nutrition issues arise, please consult RD.   Cassidy Tabet A. Mayford KnifeWilliams, RD, LDN, CDE Pager: [phone] After hours Pager: [phone]

## 2017-07-30 DIAGNOSIS — J441 Chronic obstructive pulmonary disease with (acute) exacerbation: Secondary | ICD-10-CM | POA: Diagnosis not present

## 2017-07-30 LAB — MAGNESIUM: Magnesium: 2 mg/dL (ref 1.7–2.4)

## 2017-07-30 LAB — BASIC METABOLIC PANEL
Anion gap: 8 (ref 5–15)
BUN: 17 mg/dL (ref 6–20)
CALCIUM: 9.2 mg/dL (ref 8.9–10.3)
CO2: 25 mmol/L (ref 22–32)
Chloride: 105 mmol/L (ref 101–111)
Creatinine, Ser: 0.66 mg/dL (ref 0.44–1.00)
GFR calc Af Amer: >60 mL/min (ref >60)
GLUCOSE: 96 mg/dL (ref 65–99)
POTASSIUM: 3.7 mmol/L (ref 3.5–5.1)
Sodium: 138 mmol/L (ref 135–145)

## 2017-07-30 MED ORDER — DOXYCYCLINE HYCLATE 100 MG PO TABS: 100.0000 mg | ORAL_TABLET | Freq: Two times a day (BID) | ORAL | 0 refills | Status: AC

## 2017-07-30 MED ORDER — IPRATROPIUM BROMIDE HFA 17 MCG/ACT IN AERS: 2.0000 | INHALATION_SPRAY | Freq: Four times a day (QID) | RESPIRATORY_TRACT | 0 refills | Status: DC

## 2017-07-30 MED ORDER — ALBUTEROL SULFATE HFA 108 (90 BASE) MCG/ACT IN AERS: 2.0000 | INHALATION_SPRAY | Freq: Four times a day (QID) | RESPIRATORY_TRACT | 0 refills | Status: DC | PRN

## 2017-07-30 MED ORDER — PREDNISONE 10 MG PO TABS: ORAL_TABLET | ORAL | 0 refills | Status: DC

## 2017-07-30 NOTE — Discharge Summary (Signed)
Physician Discharge Summary  Doris Higgins NFA:[id number] DOB: 1955/11/24 DOA: 07/28/2017  PCP: Fleet ContrasAvbuere, Edwin, MD  Admit date: 07/28/2017 Discharge date: 07/30/2017  Admitted From: Home Disposition:  Home  Recommendations for Outpatient Follow-up:  1. Follow up with PCP in 1 week 2. Would recommend outpatient pulmonary function study  Discharge Condition: Stable CODE STATUS: Full code Diet recommendation: Heart healthy  Brief/Interim Summary: Doris Higgins is a 61 yo with past medical history seizure disorder, not currently on antiepileptic, GERD, 40 year tobacco abuse history. She presents with 6 month history of progressive shortness of breath that has acutely worsened in the past 2 days. She also admits to worsening cough from her baseline, producing white and yellow sputum. She also admits to chest tightness, worsening with cough and shortness of breath. She was admitted due to COPD exacerbation.  Respiratory PCR panel was negative for influenza. She was treated for suspected COPD exacerbation with steroids, breathing treatments, doxycycline. Acute coronary syndrome was ruled out with negative troponin. Pulmonary embolism ruled out with negative d-dimer. She continued to improve symptomatically.  Discharge Diagnoses:  Active Problems:   COPD with acute exacerbation (HCC)   Hypokalemia   Seizure disorder (HCC)   COPD exacerbation (HCC)   COPD exacerbation versus reactive airway disease -Has not previously been diagnosed with COPD, however, patient has a 40-pack-year history of tobacco abuse -Would recommend PFT as outpatient -Respiratory PCR negative  -Improving with steroids, breathing treatments, doxycycline. Continue steroid taper, doxycycline, prescribed pro-air as well as ipratropium on discharge  Atypical chest pain -Chest tightness a/w deep breath, cough, dyspea -Troponin x 3 negative  -D dimer negative   Depression -Continue Celexa  Chronic pain from  arthritis  -Continue home naproxen, tramadol, gabapentin, zanaflex   Restless leg -Continue home pramipexole  History of seizure disorder -Has now stopped antiepileptics on her own. No breakthrough seizures.    Discharge Instructions  Discharge Instructions    Call MD for:  difficulty breathing, headache or visual disturbances    Complete by:  As directed    Call MD for:  extreme fatigue    Complete by:  As directed    Call MD for:  hives    Complete by:  As directed    Call MD for:  persistant dizziness or light-headedness    Complete by:  As directed    Call MD for:  persistant nausea and vomiting    Complete by:  As directed    Call MD for:  severe uncontrolled pain    Complete by:  As directed    Call MD for:  temperature >100.4    Complete by:  As directed    Diet - low sodium heart healthy    Complete by:  As directed    Discharge instructions    Complete by:  As directed    You were cared for by a hospitalist during your hospital stay. If you have any questions about your discharge medications or the care you received while you were in the hospital after you are discharged, you can call the unit and asked to speak with the hospitalist on call if the hospitalist that took care of you is not available. Once you are discharged, your primary care physician will handle any further medical issues. Please note that NO REFILLS for any discharge medications will be authorized once you are discharged, as it is imperative that you return to your primary care physician (or establish a relationship with a primary care physician  if you do not have one) for your aftercare needs so that they can reassess your need for medications and monitor your lab values.   Increase activity slowly    Complete by:  As directed      Allergies as of 07/30/2017   No Known Allergies     Medication List    TAKE these medications   albuterol 108 (90 Base) MCG/ACT inhaler Commonly known as:   PROVENTIL HFA;VENTOLIN HFA Inhale 2 puffs into the lungs every 6 (six) hours as needed for wheezing or shortness of breath.   B-complex with vitamin C tablet Take 1 tablet by mouth daily.   citalopram 20 MG tablet Commonly known as:  CELEXA Take 20 mg by mouth daily.   doxycycline 100 MG tablet Commonly known as:  VIBRA-TABS Take 1 tablet (100 mg total) by mouth every 12 (twelve) hours.   gabapentin 600 MG tablet Commonly known as:  NEURONTIN Take 600 mg by mouth 2 (two) times daily.   hydrOXYzine 10 MG tablet Commonly known as:  ATARAX/VISTARIL Take 10 mg by mouth at bedtime as needed (sleep).   ipratropium 17 MCG/ACT inhaler Commonly known as:  ATROVENT HFA Inhale 2 puffs into the lungs 4 (four) times daily.   multivitamin with minerals Tabs tablet Take 1 tablet by mouth daily.   naproxen 500 MG tablet Commonly known as:  NAPROSYN Take 500 mg by mouth 2 (two) times daily.   pramipexole 0.25 MG tablet Commonly known as:  MIRAPEX Take 0.25 mg by mouth at bedtime.   predniSONE 10 MG tablet Commonly known as:  DELTASONE Take 4 tabs for 3 days, then 3 tabs for 3 days, then 2 tabs for 3 days, then 1 tab for 3 days, then 1/2 tab for 4 days.   tiZANidine 4 MG tablet Commonly known as:  ZANAFLEX Take 4 mg by mouth every 6 (six) hours as needed for muscle spasms.   vitamin C 500 MG tablet Commonly known as:  ASCORBIC ACID Take 500 mg by mouth daily.      Follow-up Information    Fleet Contras, MD. Schedule an appointment as soon as possible for a visit in 1 week(s).   Specialty:  Internal Medicine Why:  Would recommend outpatient pulmonary function test  Contact information: [address] [place] [address] [phone]          No Known Allergies  Consultations:  None   Procedures/Studies: Dg Chest Portable 1 View  Result Date: 07/28/2017 CLINICAL DATA:  Cough and shortness of Breath EXAM: PORTABLE CHEST 1 VIEW COMPARISON:  03/06/2010  FINDINGS: Cardiac shadow is within normal limits. The lungs are well aerated bilaterally. Minimal left basilar atelectasis is seen. No focal confluent infiltrate or sizable effusion is noted. IMPRESSION: Minimal left basilar atelectasis. Electronically Signed   By: Alcide Clever M.D.   On: 07/28/2017 21:31      Discharge Exam: Vitals:   07/30/17 0205 07/30/17 0545  BP: 130/72 129/76  Pulse:  73  Resp: 18 20  Temp: 98 F (36.7 C) 98 F (36.7 C)  SpO2: 99% 99%   Vitals:   07/29/17 1413 07/29/17 2225 07/30/17 0205 07/30/17 0545  BP: 136/71 139/77 130/72 129/76  Pulse: 94 82  73  Resp: 16 20 18 20   Temp: 98 F (36.7 C) 98 F (36.7 C) 98 F (36.7 C) 98 F (36.7 C)  TempSrc: Oral Oral Oral Oral  SpO2: 96% 96% 99% 99%  Weight:      Height:  General: Pt is alert, awake, not in acute distress Cardiovascular: RRR, S1/S2 +, no rubs, no gallops Respiratory: CTA bilaterally, no wheezing, no rhonchi, no conversational dyspnea, on room air Abdominal: Soft, NT, ND, bowel sounds + Extremities: no edema, no cyanosis    The results of significant diagnostics from this hospitalization (including imaging, microbiology, ancillary and laboratory) are listed below for reference.     Microbiology: Recent Results (from the past 240 hour(s))  Respiratory Panel by PCR     Status: None   Collection Time: 07/29/17 11:15 AM  Result Value Ref Range Status   Adenovirus NOT DETECTED NOT DETECTED Final   Coronavirus 229E NOT DETECTED NOT DETECTED Final   Coronavirus HKU1 NOT DETECTED NOT DETECTED Final   Coronavirus NL63 NOT DETECTED NOT DETECTED Final   Coronavirus OC43 NOT DETECTED NOT DETECTED Final   Metapneumovirus NOT DETECTED NOT DETECTED Final   Rhinovirus / Enterovirus NOT DETECTED NOT DETECTED Final   Influenza A NOT DETECTED NOT DETECTED Final   Influenza B NOT DETECTED NOT DETECTED Final   Parainfluenza Virus 1 NOT DETECTED NOT DETECTED Final   Parainfluenza Virus 2 NOT  DETECTED NOT DETECTED Final   Parainfluenza Virus 3 NOT DETECTED NOT DETECTED Final   Parainfluenza Virus 4 NOT DETECTED NOT DETECTED Final   Respiratory Syncytial Virus NOT DETECTED NOT DETECTED Final   Bordetella pertussis NOT DETECTED NOT DETECTED Final   Chlamydophila pneumoniae NOT DETECTED NOT DETECTED Final   Mycoplasma pneumoniae NOT DETECTED NOT DETECTED Final     Labs: BNP (last 3 results) No results for input(s): BNP in the last 8760 hours. Basic Metabolic Panel:  Recent Labs Lab 07/28/17 2117 07/29/17 0416 07/30/17 0527  NA 140 140 138  K 3.0* 3.0* 3.7  CL 103 104 105  CO2 29 23 25   GLUCOSE 94 202* 96  BUN 11 16 17   CREATININE 0.74 0.81 0.66  CALCIUM 9.0 9.0 9.2  MG  --  1.8 2.0  PHOS  --  2.9  --    Liver Function Tests:  Recent Labs Lab 07/28/17 2117 07/29/17 0416  AST 24 29  ALT 14 14  ALKPHOS 87 75  BILITOT 0.6 0.5  PROT 6.6 6.5  ALBUMIN 3.9 3.8   No results for input(s): LIPASE, AMYLASE in the last 168 hours. No results for input(s): AMMONIA in the last 168 hours. CBC:  Recent Labs Lab 07/28/17 2117 07/29/17 0416  WBC 10.4 9.6  NEUTROABS 4.9  --   HGB 12.1 11.4*  HCT 37.0 35.0*  MCV 86.4 86.6  PLT 256 248   Cardiac Enzymes:  Recent Labs Lab 07/29/17 0416 07/29/17 0813 07/29/17 0907  TROPONINI <0.03 <0.03 <0.03   BNP: Invalid input(s): POCBNP CBG: No results for input(s): GLUCAP in the last 168 hours. D-Dimer  Recent Labs  07/29/17 0416  DDIMER 0.40   Hgb A1c No results for input(s): HGBA1C in the last 72 hours. Lipid Profile No results for input(s): CHOL, HDL, LDLCALC, TRIG, CHOLHDL, LDLDIRECT in the last 72 hours. Thyroid function studies  Recent Labs  07/29/17 0416  TSH 0.593   Anemia work up No results for input(s): VITAMINB12, FOLATE, FERRITIN, TIBC, IRON, RETICCTPCT in the last 72 hours. Urinalysis    Component Value Date/Time   COLORURINE YELLOW 07/14/2010 2135   APPEARANCEUR CLEAR 07/14/2010 2135    LABSPEC 1.026 07/14/2010 2135   PHURINE 5.5 07/14/2010 2135   GLUCOSEU NEGATIVE 07/14/2010 2135   HGBUR NEGATIVE 07/14/2010 2135   BILIRUBINUR NEGATIVE 07/14/2010 2135  KETONESUR TRACE (A) 07/14/2010 2135   PROTEINUR 30 (A) 07/14/2010 2135   UROBILINOGEN 0.2 07/14/2010 2135   NITRITE NEGATIVE 07/14/2010 2135   LEUKOCYTESUR NEGATIVE 07/14/2010 2135   Sepsis Labs Invalid input(s): PROCALCITONIN,  WBC,  LACTICIDVEN Microbiology Recent Results (from the past 240 hour(s))  Respiratory Panel by PCR     Status: None   Collection Time: 07/29/17 11:15 AM  Result Value Ref Range Status   Adenovirus NOT DETECTED NOT DETECTED Final   Coronavirus 229E NOT DETECTED NOT DETECTED Final   Coronavirus HKU1 NOT DETECTED NOT DETECTED Final   Coronavirus NL63 NOT DETECTED NOT DETECTED Final   Coronavirus OC43 NOT DETECTED NOT DETECTED Final   Metapneumovirus NOT DETECTED NOT DETECTED Final   Rhinovirus / Enterovirus NOT DETECTED NOT DETECTED Final   Influenza A NOT DETECTED NOT DETECTED Final   Influenza B NOT DETECTED NOT DETECTED Final   Parainfluenza Virus 1 NOT DETECTED NOT DETECTED Final   Parainfluenza Virus 2 NOT DETECTED NOT DETECTED Final   Parainfluenza Virus 3 NOT DETECTED NOT DETECTED Final   Parainfluenza Virus 4 NOT DETECTED NOT DETECTED Final   Respiratory Syncytial Virus NOT DETECTED NOT DETECTED Final   Bordetella pertussis NOT DETECTED NOT DETECTED Final   Chlamydophila pneumoniae NOT DETECTED NOT DETECTED Final   Mycoplasma pneumoniae NOT DETECTED NOT DETECTED Final     Time coordinating discharge: 40 minutes  SIGNED:  Noralee Stain, DO [facility] Pager [phone]  If 7PM-7AM, please contact night-coverage [email or web address] Password [email or web address] 07/30/2017, 2:52 PM

## 2017-07-30 NOTE — Discharge Instructions (Signed)
Chronic Obstructive Pulmonary Disease Chronic obstructive pulmonary disease (COPD) is a common lung condition in which airflow from the lungs is limited. COPD is a general term that can be used to describe many different lung problems that limit airflow, including both chronic bronchitis and emphysema. If you have COPD, your lung function will probably never return to normal, but there are measures you can take to improve lung function and make yourself feel better. What are the causes?  Smoking (common).  Exposure to secondhand smoke.  Genetic problems.  Chronic inflammatory lung diseases or recurrent infections. What are the signs or symptoms?  Shortness of breath, especially with physical activity.  Deep, persistent (chronic) cough with a large amount of thick mucus.  Wheezing.  Rapid breaths (tachypnea).  Gray or bluish discoloration (cyanosis) of the skin, especially in your fingers, toes, or lips.  Fatigue.  Weight loss.  Frequent infections or episodes when breathing symptoms become much worse (exacerbations).  Chest tightness. How is this diagnosed? Your health care provider will take a medical history and perform a physical examination to diagnose COPD. Additional tests for COPD may include:  Lung (pulmonary) function tests.  Chest X-ray.  CT scan.  Blood tests. How is this treated? Treatment for COPD may include:  Inhaler and nebulizer medicines. These help manage the symptoms of COPD and make your breathing more comfortable.  Supplemental oxygen. Supplemental oxygen is only helpful if you have a low oxygen level in your blood.  Exercise and physical activity. These are beneficial for nearly all people with COPD.  Lung surgery or transplant.  Nutrition therapy to gain weight, if you are underweight.  Pulmonary rehabilitation. This may involve working with a team of health care providers and specialists, such as respiratory, occupational, and physical  therapists. Follow these instructions at home:  Take all medicines (inhaled or pills) as directed by your health care provider.  Avoid over-the-counter medicines or cough syrups that dry up your airway (such as antihistamines) and slow down the elimination of secretions unless instructed otherwise by your health care provider.  If you are a smoker, the most important thing that you can do is stop smoking. Continuing to smoke will cause further lung damage and breathing trouble. Ask your health care provider for help with quitting smoking. He or she can direct you to community resources or hospitals that provide support.  Avoid exposure to irritants such as smoke, chemicals, and fumes that aggravate your breathing.  Use oxygen therapy and pulmonary rehabilitation if directed by your health care provider. If you require home oxygen therapy, ask your health care provider whether you should purchase a pulse oximeter to measure your oxygen level at home.  Avoid contact with individuals who have a contagious illness.  Avoid extreme temperature and humidity changes.  Eat healthy foods. Eating smaller, more frequent meals and resting before meals may help you maintain your strength.  Stay active, but balance activity with periods of rest. Exercise and physical activity will help you maintain your ability to do things you want to do.  Preventing infection and hospitalization is very important when you have COPD. Make sure to receive all the vaccines your health care provider recommends, especially the pneumococcal and influenza vaccines. Ask your health care provider whether you need a pneumonia vaccine.  Learn and use relaxation techniques to manage stress.  Learn and use controlled breathing techniques as directed by your health care provider. Controlled breathing techniques include: 1. Pursed lip breathing. Start by breathing in (inhaling)   through your nose for 1 second. Then, purse your lips as  if you were going to whistle and breathe out (exhale) through the pursed lips for 2 seconds. 2. Diaphragmatic breathing. Start by putting one hand on your abdomen just above your waist. Inhale slowly through your nose. The hand on your abdomen should move out. Then purse your lips and exhale slowly. You should be able to feel the hand on your abdomen moving in as you exhale.  Learn and use controlled coughing to clear mucus from your lungs. Controlled coughing is a series of short, progressive coughs. The steps of controlled coughing are: 1. Lean your head slightly forward. 2. Breathe in deeply using diaphragmatic breathing. 3. Try to hold your breath for 3 seconds. 4. Keep your mouth slightly open while coughing twice. 5. Spit any mucus out into a tissue. 6. Rest and repeat the steps once or twice as needed. Contact a health care provider if:  You are coughing up more mucus than usual.  There is a change in the color or thickness of your mucus.  Your breathing is more labored than usual.  Your breathing is faster than usual. Get help right away if:  You have shortness of breath while you are resting.  You have shortness of breath that prevents you from:  Being able to talk.  Performing your usual physical activities.  You have chest pain lasting longer than 5 minutes.  Your skin color is more cyanotic than usual.  You measure low oxygen saturations for longer than 5 minutes with a pulse oximeter. This information is not intended to replace advice given to you by your health care provider. Make sure you discuss any questions you have with your health care provider. Document Released: 07/01/2005 Document Revised: 02/27/2016 Document Reviewed: 05/18/2013 Elsevier Interactive Patient Education  2017 [employer]  

## 2017-07-30 NOTE — Progress Notes (Signed)
Gerda DissLinda R Kirkman to be D/C'd Home per MD order.  Discussed with the patient and all questions fully answered.  VSS, Skin clean, dry and intact without evidence of skin break down, no evidence of skin tears noted. IV catheter discontinued intact. Site without signs and symptoms of complications. Dressing and pressure applied.  An After Visit Summary was printed and given to the patient. Patient received prescription.  D/c education completed with patient/family including follow up instructions, medication list, d/c activities limitations if indicated, with other d/c instructions as indicated by MD - patient able to verbalize understanding, all questions fully answered.   Patient instructed to return to ED, call 911, or call MD for any changes in condition.   Patient refused to be escorted and D/C'd home via private auto.  Grayling Congressvan J Sedalia Greeson 07/30/2017 10:35 AM

## 2018-01-26 ENCOUNTER — Encounter (HOSPITAL_COMMUNITY): Payer: Self-pay

## 2018-01-26 ENCOUNTER — Emergency Department (HOSPITAL_COMMUNITY)
Admission: EM | Admit: 2018-01-26 | Discharge: 2018-01-26 | Disposition: A | Discharge status: Home / Self Care | Payer: Medicare Other | Attending: Emergency Medicine | Admitting: Emergency Medicine

## 2018-01-26 DIAGNOSIS — Z79899 Other long term (current) drug therapy: Secondary | ICD-10-CM | POA: Insufficient documentation

## 2018-01-26 DIAGNOSIS — R569 Unspecified convulsions: Secondary | ICD-10-CM | POA: Diagnosis not present

## 2018-01-26 DIAGNOSIS — Z87891 Personal history of nicotine dependence: Secondary | ICD-10-CM | POA: Insufficient documentation

## 2018-01-26 DIAGNOSIS — J449 Chronic obstructive pulmonary disease, unspecified: Secondary | ICD-10-CM | POA: Diagnosis not present

## 2018-01-26 MED ORDER — MELOXICAM 7.5 MG PO TABS: 7.5000 mg | ORAL_TABLET | Freq: Every day | ORAL | 0 refills | Status: DC

## 2018-01-26 MED ORDER — LEVETIRACETAM 500 MG PO TABS: 500.0000 mg | ORAL_TABLET | Freq: Two times a day (BID) | ORAL | 2 refills | Status: DC

## 2018-01-26 MED ORDER — LEVETIRACETAM 500 MG PO TABS
500.0000 mg | ORAL_TABLET | Freq: Once | ORAL | Status: AC
  Administered 2018-01-26: 500 mg via ORAL
  Filled 2018-01-26: qty 1

## 2018-01-26 NOTE — ED Triage Notes (Addendum)
Patient arrived via [facility] from [facility]. 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 Hx. Of seizures. Patient quit taking her medication (Keppra) a year ago and has not had en episode since. Unconscious for 20 min then postictal for about 20 min. Patient is now A/Ox3. Home medications areGabapentin and naproxen. No known allergies.
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
## 2018-02-26 ENCOUNTER — Emergency Department (HOSPITAL_COMMUNITY)
Admission: EM | Admit: 2018-02-26 | Discharge: 2018-02-26 | Disposition: A | Discharge status: Home / Self Care | Payer: Medicare Other | Attending: Emergency Medicine | Admitting: Emergency Medicine

## 2018-02-26 ENCOUNTER — Emergency Department (HOSPITAL_COMMUNITY): Payer: Medicare Other

## 2018-02-26 ENCOUNTER — Other Ambulatory Visit: Payer: Self-pay

## 2018-02-26 DIAGNOSIS — E876 Hypokalemia: Secondary | ICD-10-CM | POA: Insufficient documentation

## 2018-02-26 DIAGNOSIS — R569 Unspecified convulsions: Secondary | ICD-10-CM | POA: Insufficient documentation

## 2018-02-26 DIAGNOSIS — J449 Chronic obstructive pulmonary disease, unspecified: Secondary | ICD-10-CM | POA: Diagnosis not present

## 2018-02-26 DIAGNOSIS — Z87891 Personal history of nicotine dependence: Secondary | ICD-10-CM | POA: Insufficient documentation

## 2018-02-26 DIAGNOSIS — F0390 Unspecified dementia without behavioral disturbance: Secondary | ICD-10-CM | POA: Diagnosis not present

## 2018-02-26 DIAGNOSIS — R42 Dizziness and giddiness: Secondary | ICD-10-CM | POA: Insufficient documentation

## 2018-02-26 DIAGNOSIS — Z79899 Other long term (current) drug therapy: Secondary | ICD-10-CM | POA: Diagnosis not present

## 2018-02-26 LAB — CBC WITH DIFFERENTIAL/PLATELET
Abs Immature Granulocytes: 0 10*3/uL (ref 0.0–0.1)
BASOS ABS: 0.1 10*3/uL (ref 0.0–0.1)
Basophils Relative: 1 %
EOS PCT: 0 %
Eosinophils Absolute: 0 10*3/uL (ref 0.0–0.7)
HEMATOCRIT: 38.7 % (ref 36.0–46.0)
HEMOGLOBIN: 12.9 g/dL (ref 12.0–15.0)
Immature Granulocytes: 0 %
LYMPHS PCT: 25 %
Lymphs Abs: 2 10*3/uL (ref 0.7–4.0)
MCH: 28.5 pg (ref 26.0–34.0)
MCHC: 33.3 g/dL (ref 30.0–36.0)
MCV: 85.6 fL (ref 78.0–100.0)
Monocytes Absolute: 0.4 10*3/uL (ref 0.1–1.0)
Monocytes Relative: 5 %
Neutro Abs: 5.7 10*3/uL (ref 1.7–7.7)
Neutrophils Relative %: 69 %
Platelets: 247 10*3/uL (ref 150–400)
RBC: 4.52 MIL/uL (ref 3.87–5.11)
RDW: 14.7 % (ref 11.5–15.5)
WBC: 8.2 10*3/uL (ref 4.0–10.5)

## 2018-02-26 LAB — URINALYSIS, ROUTINE W REFLEX MICROSCOPIC
Bilirubin Urine: NEGATIVE
Glucose, UA: NEGATIVE mg/dL
Hgb urine dipstick: NEGATIVE
Ketones, ur: NEGATIVE mg/dL
Leukocytes, UA: NEGATIVE
Nitrite: NEGATIVE
Protein, ur: NEGATIVE mg/dL
Specific Gravity, Urine: 1.011 (ref 1.005–1.030)
pH: 6 (ref 5.0–8.0)

## 2018-02-26 LAB — COMPREHENSIVE METABOLIC PANEL
ALK PHOS: 63 U/L (ref 38–126)
ALT: 12 U/L — AB (ref 14–54)
AST: 20 U/L (ref 15–41)
Albumin: 2.7 g/dL — ABNORMAL LOW (ref 3.5–5.0)
Anion gap: 7 (ref 5–15)
BUN: 7 mg/dL (ref 6–20)
CHLORIDE: 106 mmol/L (ref 101–111)
CO2: 23 mmol/L (ref 22–32)
Calcium: 7.9 mg/dL — ABNORMAL LOW (ref 8.9–10.3)
Creatinine, Ser: 0.67 mg/dL (ref 0.44–1.00)
GFR calc Af Amer: >60 mL/min (ref >60)
Glucose, Bld: 119 mg/dL — ABNORMAL HIGH (ref 65–99)
Potassium: 3.1 mmol/L — ABNORMAL LOW (ref 3.5–5.1)
Sodium: 136 mmol/L (ref 135–145)
TOTAL PROTEIN: 4.6 g/dL — AB (ref 6.5–8.1)
Total Bilirubin: 0.3 mg/dL (ref 0.3–1.2)

## 2018-02-26 LAB — RAPID URINE DRUG SCREEN, HOSP PERFORMED
Amphetamines: NOT DETECTED
Barbiturates: NOT DETECTED
Benzodiazepines: POSITIVE — AB
Cocaine: NOT DETECTED
Opiates: NOT DETECTED
Tetrahydrocannabinol: POSITIVE — AB

## 2018-02-26 LAB — I-STAT TROPONIN, ED: Troponin i, poc: 0 ng/mL (ref 0.00–0.08)

## 2018-02-26 LAB — ETHANOL

## 2018-02-26 MED ORDER — SODIUM CHLORIDE 0.9 % IV BOLUS (SEPSIS)
1000.0000 mL | Freq: Once | INTRAVENOUS | Status: AC
  Administered 2018-02-26: 1000 mL via INTRAVENOUS

## 2018-02-26 MED ORDER — SODIUM CHLORIDE 0.9 % IV SOLN
1000.0000 mL | INTRAVENOUS | Status: DC
  Administered 2018-02-26: 1000 mL via INTRAVENOUS

## 2018-02-26 MED ORDER — ALBUTEROL SULFATE HFA 108 (90 BASE) MCG/ACT IN AERS: 1.0000 | INHALATION_SPRAY | Freq: Four times a day (QID) | RESPIRATORY_TRACT | 0 refills | Status: DC | PRN

## 2018-02-26 MED ORDER — IPRATROPIUM-ALBUTEROL 0.5-2.5 (3) MG/3ML IN SOLN
3.0000 mL | Freq: Once | RESPIRATORY_TRACT | Status: AC
  Administered 2018-02-26: 3 mL via RESPIRATORY_TRACT
  Filled 2018-02-26: qty 3

## 2018-02-26 MED ORDER — POTASSIUM CHLORIDE CRYS ER 20 MEQ PO TBCR: 20.0000 meq | EXTENDED_RELEASE_TABLET | Freq: Every day | ORAL | 0 refills | Status: DC
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
## 2018-02-26 NOTE — ED Notes (Signed)
Patient transported to X-ray 

## 2018-02-26 NOTE — Discharge Instructions (Addendum)
Start taking her Keppra twice daily as prescribed.  Avoid any drug abuse, try to to get regular sleep and have a regular diet.  Need to schedule follow-up with your neurologist and family doctor.  If you need to get a new doctor, use the referral number in your discharge instructions to find one.

## 2018-02-26 NOTE — ED Triage Notes (Signed)
Per ems pt was at [employer] and felt like she was going pass out. Employee called ems and pt stated she wanted to go on home. Went home and sat outside and her son found pt outside in her swing passed out. Pt's son said she did not fall out and hit her head. Pt stated she feels weak and tired. Pt has hx of seizures and has been on Kepra for a month now again after going off of it for a while/100/50 ems

## 2018-04-24 ENCOUNTER — Emergency Department (HOSPITAL_COMMUNITY): Payer: Medicare Other

## 2018-04-24 ENCOUNTER — Encounter (HOSPITAL_COMMUNITY): Payer: Self-pay | Admitting: Emergency Medicine

## 2018-04-24 ENCOUNTER — Observation Stay (HOSPITAL_COMMUNITY)
Admission: EM | Admit: 2018-04-24 | Discharge: 2018-04-25 | Disposition: A | Discharge status: Other Acute Inpatient Hospital | Payer: Medicare Other | Attending: Internal Medicine | Admitting: Internal Medicine

## 2018-04-24 ENCOUNTER — Other Ambulatory Visit: Payer: Self-pay

## 2018-04-24 DIAGNOSIS — R4182 Altered mental status, unspecified: Secondary | ICD-10-CM | POA: Diagnosis present

## 2018-04-24 DIAGNOSIS — G92 Toxic encephalopathy: Secondary | ICD-10-CM | POA: Insufficient documentation

## 2018-04-24 DIAGNOSIS — G40409 Other generalized epilepsy and epileptic syndromes, not intractable, without status epilepticus: Secondary | ICD-10-CM | POA: Insufficient documentation

## 2018-04-24 DIAGNOSIS — Z87891 Personal history of nicotine dependence: Secondary | ICD-10-CM | POA: Insufficient documentation

## 2018-04-24 DIAGNOSIS — F319 Bipolar disorder, unspecified: Secondary | ICD-10-CM | POA: Diagnosis not present

## 2018-04-24 DIAGNOSIS — F1910 Other psychoactive substance abuse, uncomplicated: Secondary | ICD-10-CM

## 2018-04-24 DIAGNOSIS — R410 Disorientation, unspecified: Secondary | ICD-10-CM

## 2018-04-24 DIAGNOSIS — R45851 Suicidal ideations: Secondary | ICD-10-CM | POA: Diagnosis not present

## 2018-04-24 DIAGNOSIS — Z79899 Other long term (current) drug therapy: Secondary | ICD-10-CM | POA: Diagnosis not present

## 2018-04-24 DIAGNOSIS — S51811A Laceration without foreign body of right forearm, initial encounter: Secondary | ICD-10-CM | POA: Diagnosis not present

## 2018-04-24 DIAGNOSIS — S41111A Laceration without foreign body of right upper arm, initial encounter: Secondary | ICD-10-CM

## 2018-04-24 DIAGNOSIS — Z23 Encounter for immunization: Secondary | ICD-10-CM | POA: Insufficient documentation

## 2018-04-24 DIAGNOSIS — R680 Hypothermia, not associated with low environmental temperature: Secondary | ICD-10-CM | POA: Insufficient documentation

## 2018-04-24 DIAGNOSIS — J449 Chronic obstructive pulmonary disease, unspecified: Secondary | ICD-10-CM | POA: Insufficient documentation

## 2018-04-24 DIAGNOSIS — Y92009 Unspecified place in unspecified non-institutional (private) residence as the place of occurrence of the external cause: Secondary | ICD-10-CM | POA: Diagnosis not present

## 2018-04-24 DIAGNOSIS — T424X1A Poisoning by benzodiazepines, accidental (unintentional), initial encounter: Secondary | ICD-10-CM | POA: Diagnosis not present

## 2018-04-24 DIAGNOSIS — Y289XXA Contact with unspecified sharp object, undetermined intent, initial encounter: Secondary | ICD-10-CM | POA: Diagnosis not present

## 2018-04-24 DIAGNOSIS — F332 Major depressive disorder, recurrent severe without psychotic features: Secondary | ICD-10-CM

## 2018-04-24 DIAGNOSIS — Y939 Activity, unspecified: Secondary | ICD-10-CM | POA: Diagnosis not present

## 2018-04-24 DIAGNOSIS — F411 Generalized anxiety disorder: Secondary | ICD-10-CM

## 2018-04-24 LAB — CBC
HEMATOCRIT: 36.7 % (ref 36.0–46.0)
HEMOGLOBIN: 12.3 g/dL (ref 12.0–15.0)
MCH: 29.3 pg (ref 26.0–34.0)
MCHC: 33.5 g/dL (ref 30.0–36.0)
MCV: 87.4 fL (ref 78.0–100.0)
Platelets: 262 10*3/uL (ref 150–400)
RBC: 4.2 MIL/uL (ref 3.87–5.11)
RDW: 14.2 % (ref 11.5–15.5)
WBC: 6.9 10*3/uL (ref 4.0–10.5)

## 2018-04-24 LAB — SEDIMENTATION RATE: Sed Rate: 15 mm/hr (ref 0–22)

## 2018-04-24 LAB — URINALYSIS, ROUTINE W REFLEX MICROSCOPIC
BILIRUBIN URINE: NEGATIVE
Glucose, UA: NEGATIVE mg/dL
Hgb urine dipstick: NEGATIVE
KETONES UR: NEGATIVE mg/dL
LEUKOCYTES UA: NEGATIVE
NITRITE: NEGATIVE
Protein, ur: NEGATIVE mg/dL
SPECIFIC GRAVITY, URINE: 1.018 (ref 1.005–1.030)
pH: 6 (ref 5.0–8.0)

## 2018-04-24 LAB — COMPREHENSIVE METABOLIC PANEL
ALT: 11 U/L (ref 0–44)
ANION GAP: 7 (ref 5–15)
AST: 25 U/L (ref 15–41)
Albumin: 3.9 g/dL (ref 3.5–5.0)
Alkaline Phosphatase: 71 U/L (ref 38–126)
BILIRUBIN TOTAL: 0.8 mg/dL (ref 0.3–1.2)
BUN: 17 mg/dL (ref 6–20)
CHLORIDE: 108 mmol/L (ref 98–111)
CO2: 27 mmol/L (ref 22–32)
Calcium: 8.9 mg/dL (ref 8.9–10.3)
Creatinine, Ser: 0.56 mg/dL (ref 0.44–1.00)
GFR calc Af Amer: >60 mL/min (ref >60)
GFR calc non Af Amer: >60 mL/min (ref >60)
Glucose, Bld: 89 mg/dL (ref 70–99)
Potassium: 4 mmol/L (ref 3.5–5.1)
Sodium: 142 mmol/L (ref 135–145)
Total Protein: 6.3 g/dL — ABNORMAL LOW (ref 6.5–8.1)

## 2018-04-24 LAB — RAPID URINE DRUG SCREEN, HOSP PERFORMED
AMPHETAMINES: NOT DETECTED
Barbiturates: NOT DETECTED
Benzodiazepines: POSITIVE — AB
Cocaine: NOT DETECTED
Opiates: NOT DETECTED
TETRAHYDROCANNABINOL: POSITIVE — AB

## 2018-04-24 LAB — TSH: TSH: 2.493 u[IU]/mL (ref 0.350–4.500)

## 2018-04-24 LAB — VITAMIN B12: Vitamin B-12: 412 pg/mL (ref 180–914)

## 2018-04-24 LAB — CBG MONITORING, ED: Glucose-Capillary: 96 mg/dL (ref 70–99)

## 2018-04-24 LAB — ACETAMINOPHEN LEVEL

## 2018-04-24 LAB — SALICYLATE LEVEL: Salicylate Lvl: <7 mg/dL (ref 2.8–30.0)

## 2018-04-24 LAB — I-STAT BETA HCG BLOOD, ED (MC, WL, AP ONLY): I-stat hCG, quantitative: <5 m[IU]/mL (ref <5)

## 2018-04-24 LAB — ETHANOL

## 2018-04-24 MED ORDER — IPRATROPIUM BROMIDE 0.02 % IN SOLN
0.5000 mg | Freq: Two times a day (BID) | RESPIRATORY_TRACT | Status: DC
  Administered 2018-04-24 – 2018-04-25 (×2): 0.5 mg via RESPIRATORY_TRACT
  Filled 2018-04-24 (×2): qty 2.5

## 2018-04-24 MED ORDER — LORAZEPAM 2 MG/ML IJ SOLN
INTRAMUSCULAR | Status: AC
  Administered 2018-04-24: 1 mg via INTRAVENOUS
  Filled 2018-04-24: qty 1

## 2018-04-24 MED ORDER — PRAMIPEXOLE DIHYDROCHLORIDE 1 MG PO TABS
1.0000 mg | ORAL_TABLET | Freq: Once | ORAL | Status: AC
  Administered 2018-04-24: 1 mg via ORAL
  Filled 2018-04-24: qty 1

## 2018-04-24 MED ORDER — LORAZEPAM 2 MG/ML IJ SOLN
1.0000 mg | Freq: Once | INTRAMUSCULAR | Status: AC
  Administered 2018-04-24: 1 mg via INTRAVENOUS

## 2018-04-24 MED ORDER — LORAZEPAM 2 MG/ML IJ SOLN: 1.0000 mg | INTRAMUSCULAR | Status: DC

## 2018-04-24 MED ORDER — IPRATROPIUM BROMIDE 0.02 % IN SOLN
0.5000 mg | Freq: Four times a day (QID) | RESPIRATORY_TRACT | Status: DC
  Administered 2018-04-24: 0.5 mg via RESPIRATORY_TRACT
  Filled 2018-04-24: qty 2.5

## 2018-04-24 MED ORDER — HYDROXYZINE HCL 10 MG PO TABS: 10.0000 mg | ORAL_TABLET | Freq: Three times a day (TID) | ORAL | Status: DC

## 2018-04-24 MED ORDER — GABAPENTIN 300 MG PO CAPS
600.0000 mg | ORAL_CAPSULE | Freq: Two times a day (BID) | ORAL | Status: DC
  Administered 2018-04-24 – 2018-04-25 (×4): 600 mg via ORAL
  Filled 2018-04-24 (×4): qty 2

## 2018-04-24 MED ORDER — ACETAMINOPHEN 650 MG RE SUPP: 650.0000 mg | Freq: Four times a day (QID) | RECTAL | Status: DC | PRN

## 2018-04-24 MED ORDER — IPRATROPIUM BROMIDE HFA 17 MCG/ACT IN AERS: 2.0000 | INHALATION_SPRAY | Freq: Four times a day (QID) | RESPIRATORY_TRACT | Status: DC

## 2018-04-24 MED ORDER — LEVETIRACETAM 500 MG PO TABS
500.0000 mg | ORAL_TABLET | Freq: Two times a day (BID) | ORAL | Status: DC
  Administered 2018-04-24 – 2018-04-25 (×4): 500 mg via ORAL
  Filled 2018-04-24 (×4): qty 1

## 2018-04-24 MED ORDER — ENOXAPARIN SODIUM 40 MG/0.4ML ~~LOC~~ SOLN
40.0000 mg | SUBCUTANEOUS | Status: DC
  Administered 2018-04-25: 40 mg via SUBCUTANEOUS
  Filled 2018-04-24 (×2): qty 0.4

## 2018-04-24 MED ORDER — HYDROXYZINE HCL 10 MG PO TABS
10.0000 mg | ORAL_TABLET | Freq: Two times a day (BID) | ORAL | Status: DC
  Administered 2018-04-24 – 2018-04-25 (×4): 10 mg via ORAL
  Filled 2018-04-24 (×4): qty 1

## 2018-04-24 MED ORDER — LORAZEPAM 2 MG/ML IJ SOLN
1.0000 mg | INTRAMUSCULAR | Status: DC | PRN
  Administered 2018-04-25: 1 mg via INTRAVENOUS
  Filled 2018-04-24 (×2): qty 1

## 2018-04-24 MED ORDER — TETANUS-DIPHTH-ACELL PERTUSSIS 5-2.5-18.5 LF-MCG/0.5 IM SUSP
0.5000 mL | Freq: Once | INTRAMUSCULAR | Status: DC
  Filled 2018-04-24: qty 0.5

## 2018-04-24 MED ORDER — LIDOCAINE-EPINEPHRINE-TETRACAINE (LET) SOLUTION
3.0000 mL | Freq: Once | NASAL | Status: AC
  Administered 2018-04-24: 3 mL via TOPICAL
  Filled 2018-04-24: qty 3

## 2018-04-24 MED ORDER — SODIUM CHLORIDE 0.9 % IV SOLN
INTRAVENOUS | Status: AC
  Administered 2018-04-24: 09:00:00 via INTRAVENOUS

## 2018-04-24 MED ORDER — ALBUTEROL SULFATE (2.5 MG/3ML) 0.083% IN NEBU: 2.5000 mg | INHALATION_SOLUTION | Freq: Four times a day (QID) | RESPIRATORY_TRACT | Status: DC | PRN

## 2018-04-24 MED ORDER — HYDROXYZINE HCL 25 MG PO TABS: 50.0000 mg | ORAL_TABLET | Freq: Every evening | ORAL | Status: DC | PRN

## 2018-04-24 MED ORDER — PRAMIPEXOLE DIHYDROCHLORIDE 1 MG PO TABS
1.0000 mg | ORAL_TABLET | Freq: Every day | ORAL | Status: DC
  Administered 2018-04-24 – 2018-04-25 (×2): 1 mg via ORAL
  Filled 2018-04-24 (×2): qty 1

## 2018-04-24 MED ORDER — ACETAMINOPHEN 325 MG PO TABS
650.0000 mg | ORAL_TABLET | Freq: Four times a day (QID) | ORAL | Status: DC | PRN
  Administered 2018-04-24 – 2018-04-25 (×2): 650 mg via ORAL
  Filled 2018-04-24 (×2): qty 2

## 2018-04-24 MED ORDER — MIRTAZAPINE 15 MG PO TABS
15.0000 mg | ORAL_TABLET | Freq: Every day | ORAL | Status: DC
  Administered 2018-04-24 – 2018-04-25 (×2): 15 mg via ORAL
  Filled 2018-04-24 (×2): qty 1

## 2018-04-24 NOTE — Progress Notes (Signed)
Pt belongings bag with denture cup with one pair of hoop earrings, flip flops, shirt, shorts, underwater, sports bar, and hair clip in bin in equipment storage room. Medications, 2 bottles of gabapentin, 1 bottle of Celexa, and 1 bottle of Pramipexole sent to pharmacy.
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
## 2018-04-24 NOTE — ED Notes (Signed)
Bed: RESB Expected date:  Expected time:  Means of arrival:  Comments: 62 yo F/ Laceration to right arm unknown Xanax intake

## 2018-04-24 NOTE — ED Notes (Signed)
ED TO INPATIENT HANDOFF REPORT  Name/Age/Gender Doris Higgins 62 y.o. female  Code Status    Code Status Orders  (From admission, onward)        Start     Ordered   04/24/18 0609  Full code  Continuous     04/24/18 0610    Code Status History    Date Active Date Inactive Code Status Order ID Comments User Context   07/29/2017 0257 07/30/2017 1607 Full Code [id number]  Toy Baker, MD Inpatient      Home/SNF/Other Home  Chief Complaint Altered LOC poss OD  Level of Care/Admitting Diagnosis ED Disposition    ED Disposition Condition Comment   [facility]: [facility] [[id number]]  Level of Care: Telemetry [5]  Admit to tele based on following criteria: Monitor QTC interval  Diagnosis: Altered mental status [780.97.ICD-9-CM]  Admitting Physician: Jani Gravel [[id number]]  Attending Physician: Jani Gravel [[id number]]  PT Class (Do Not Modify): Observation [104]  PT Acc Code (Do Not Modify): Observation [10022]       Medical History Past Medical History:  Diagnosis Date  . Anxiety   . Arthritis    "hands, arms, fingers, wrists, elbows, shoulders, all down my spine" (07/29/2017)  . Chronic back pain   . COPD (chronic obstructive pulmonary disease) ([place])    Archie Endo 07/28/2017  . Depression   . Glaucoma, both eyes   . [place] mal seizure ([place])    "since 2010; last one was 2016; never did find out why" (07/29/2017)  . Headache    "a few times/week" (07/28/2017)  . History of stomach ulcers 1978  . Menopause   . Migraine    "maybe 3-4 q 6 months" (07/28/2017)  . Pneumonia    "when I was a kid" (07/29/2017)  . Restless legs   . Suicide and self-inflicted injury ([place])     Allergies No Known Allergies  IV Location/Drains/Wounds Patient Lines/Drains/Airways Status   Active Line/Drains/Airways    Name:   Placement date:   Placement time:   Site:   Days:   Peripheral IV 04/24/18 Left Wrist   04/24/18    0204    Wrist   less than 1    Incision (Closed) 03/12/14 Eye Left   03/12/14    1218     1504   Incision (Closed) 07/23/14 Eye Right   07/23/14    0807     1371          Labs/Imaging Results for orders placed or performed during the hospital encounter of 04/24/18 (from the past 48 hour(s))  Comprehensive metabolic panel     Status: Abnormal   Collection Time: 04/24/18  1:58 AM  Result Value Ref Range   Sodium 142 135 - 145 mmol/L   Potassium 4.0 3.5 - 5.1 mmol/L   Chloride 108 98 - 111 mmol/L    Comment: Please note change in reference range.   CO2 27 22 - 32 mmol/L   Glucose, Bld 89 70 - 99 mg/dL    Comment: Please note change in reference range.   BUN 17 6 - 20 mg/dL    Comment: Please note change in reference range.   Creatinine, Ser 0.56 0.44 - 1.00 mg/dL   Calcium 8.9 8.9 - 10.3 mg/dL   Total Protein 6.3 (L) 6.5 - 8.1 g/dL   Albumin 3.9 3.5 - 5.0 g/dL   AST 25 15 - 41 U/L   ALT 11 0 - 44 U/L  Comment: Please note change in reference range.   Alkaline Phosphatase 71 38 - 126 U/L   Total Bilirubin 0.8 0.3 - 1.2 mg/dL   GFR calc non Af Amer >60 >60 mL/min   GFR calc Af Amer >60 >60 mL/min    Comment: (NOTE) The eGFR has been calculated using the CKD EPI equation. This calculation has not been validated in all clinical situations. eGFR's persistently <60 mL/min signify possible Chronic Kidney Disease.    Anion gap 7 5 - 15    Comment: Performed at [facility], [place] [address], [place], [place] [address]  CBC     Status: None   Collection Time: 04/24/18  1:58 AM  Result Value Ref Range   WBC 6.9 4.0 - 10.5 K/uL   RBC 4.20 3.87 - 5.11 MIL/uL   Hemoglobin 12.3 12.0 - 15.0 g/dL   HCT 36.7 36.0 - 46.0 %   MCV 87.4 78.0 - 100.0 fL   MCH 29.3 26.0 - 34.0 pg   MCHC 33.5 30.0 - 36.0 g/dL   RDW 14.2 11.5 - 15.5 %   Platelets 262 150 - 400 K/uL    Comment: Performed at [facility], [place] [address] [place], [place] [address]  Ethanol     Status: None   Collection  Time: 04/24/18  2:00 AM  Result Value Ref Range   Alcohol, Ethyl (B) <10 <10 mg/dL    Comment: (NOTE) Lowest detectable limit for serum alcohol is 10 mg/dL. For medical purposes only. Performed at [facility], [place] [address] [place], [place] [address]   Salicylate level     Status: None   Collection Time: 04/24/18  2:00 AM  Result Value Ref Range   Salicylate Lvl <5.4 2.8 - 30.0 mg/dL    Comment: Performed at [facility], [place] [address], [place], [place] [address]  Acetaminophen level     Status: Abnormal   Collection Time: 04/24/18  2:00 AM  Result Value Ref Range   Acetaminophen (Tylenol), Serum <10 (L) 10 - 30 ug/mL    Comment: (NOTE) Therapeutic concentrations vary significantly. A range of 10-30 ug/mL  may be an effective concentration for many patients. However, some  are best treated at concentrations outside of this range. Acetaminophen concentrations >150 ug/mL at 4 hours after ingestion  and >50 ug/mL at 12 hours after ingestion are often associated with  toxic reactions. Performed at [facility], [place] [address], [place], [place] [address]   I-Stat beta hCG blood, ED     Status: None   Collection Time: 04/24/18  2:04 AM  Result Value Ref Range   I-stat hCG, quantitative <5.0 <5 mIU/mL   Comment 3            Comment:   GEST. AGE      CONC.  (mIU/mL)   <=1 WEEK        5 - 50     2 WEEKS       50 - 500     3 WEEKS       100 - 10,000     4 WEEKS     1,000 - 30,000        FEMALE AND NON-PREGNANT FEMALE:     LESS THAN 5 mIU/mL   CBG monitoring, ED     Status: None   Collection Time: 04/24/18  3:35 AM  Result Value Ref Range   Glucose-Capillary 96 70 - 99 mg/dL  Rapid urine drug screen (hospital performed)     Status: Abnormal   Collection Time: 04/24/18  3:52 AM  Result Value Ref Range   Opiates NONE DETECTED NONE DETECTED   Cocaine NONE DETECTED NONE DETECTED   Benzodiazepines POSITIVE (A) NONE DETECTED    Amphetamines NONE DETECTED NONE DETECTED   Tetrahydrocannabinol POSITIVE (A) NONE DETECTED   Barbiturates NONE DETECTED NONE DETECTED    Comment: (NOTE) DRUG SCREEN FOR MEDICAL PURPOSES ONLY.  IF CONFIRMATION IS NEEDED FOR ANY PURPOSE, NOTIFY LAB WITHIN 5 DAYS. LOWEST DETECTABLE LIMITS FOR URINE DRUG SCREEN Drug Class                     Cutoff (ng/mL) Amphetamine and metabolites    1000 Barbiturate and metabolites    200 Benzodiazepine                 371 Tricyclics and metabolites     300 Opiates and metabolites        300 Cocaine and metabolites        300 THC                            50 Performed at [facility], [place] [address] [place], [place] [address]   Urinalysis, Routine w reflex microscopic     Status: None   Collection Time: 04/24/18  3:52 AM  Result Value Ref Range   Color, Urine YELLOW YELLOW   APPearance CLEAR CLEAR   Specific Gravity, Urine 1.018 1.005 - 1.030   pH 6.0 5.0 - 8.0   Glucose, UA NEGATIVE NEGATIVE mg/dL   Hgb urine dipstick NEGATIVE NEGATIVE   Bilirubin Urine NEGATIVE NEGATIVE   Ketones, ur NEGATIVE NEGATIVE mg/dL   Protein, ur NEGATIVE NEGATIVE mg/dL   Nitrite NEGATIVE NEGATIVE   Leukocytes, UA NEGATIVE NEGATIVE    Comment: Performed at [place] [address] [place], [place] [address]   Dg Chest 1 View  Result Date: 04/24/2018 CLINICAL DATA:  Initial evaluation for acute pain. EXAM: CHEST  1 VIEW COMPARISON:  Prior radiograph from 02/26/2018. FINDINGS: Cardiac and mediastinal silhouettes are stable in size and contour, and remain within normal limits. Mild aortic atherosclerosis. Lungs hypoinflated. Associated left basilar atelectasis. No other focal airspace disease. No pulmonary edema or pleural effusion. No pneumothorax. No acute osseus abnormality. IMPRESSION: 1. Shallow lung inflation with secondary mild left basilar atelectasis. 2. No other active cardiopulmonary disease. Electronically  Signed   By: Jeannine Boga M.D.   On: 04/24/2018 03:34   Dg Forearm Right  Result Date: 04/24/2018 CLINICAL DATA:  Initial evaluation for acute pain. EXAM: RIGHT FOREARM - 2 VIEW COMPARISON:  None. FINDINGS: No acute fracture or dislocation. Mild degenerative changes noted about the wrist and elbow. Osseous mineralization normal. No soft tissue abnormality. IMPRESSION: No acute osseous abnormality about the right forearm. Electronically Signed   By: Jeannine Boga M.D.   On: 04/24/2018 03:31   Ct Head Wo Contrast  Result Date: 04/24/2018 CLINICAL DATA:  Bruise to the left side of head. History of depression and anxiety. Seizures. EXAM: CT HEAD WITHOUT CONTRAST CT CERVICAL SPINE WITHOUT CONTRAST TECHNIQUE: Multidetector CT imaging of the head and cervical spine was performed following the standard protocol without intravenous contrast. Multiplanar CT image reconstructions of the cervical spine were also generated. COMPARISON:  07/24/2013 FINDINGS: CT HEAD FINDINGS BRAIN: The ventricles and sulci are normal. No intraparenchymal hemorrhage, mass  effect nor midline shift. No acute large vascular territory infarcts. No abnormal extra-axial fluid collections. Basal cisterns are midline and not effaced. No acute cerebellar abnormality. VASCULAR: No hyperdense vessel sign. SKULL/SOFT TISSUES: No skull fracture. No significant soft tissue swelling. ORBITS/SINUSES: The included ocular globes and orbital contents are normal.The mastoid air-cells and included paranasal sinuses are well-aerated. OTHER: None. CT CERVICAL SPINE FINDINGS ALIGNMENT: Grade 1 anterolisthesis of C4 on C5 likely on the basis of degenerative disc disease and facet arthropathy. Maintained cervical lordosis otherwise. Intact craniocervical relationship and atlantodental interval. SKULL BASE AND VERTEBRAE: Cervical vertebral bodies and posterior elements are intact. Moderate disc space narrowing C2-3, marked disc space narrowing C5-6  and moderate at C6-7. Mild facet hypertrophy on the left at C3-4 and on the right at C4-5. Mild uncovertebral joint osteoarthritis with uncinate spurring is seen on the right pad C4-5 and C5-6 as well as bilaterally at C6-7. No destructive bony lesions. C1-2 articulation maintained. SOFT TISSUES AND SPINAL CANAL: Normal. DISC LEVELS: No significant osseous canal stenosis. Mild to moderate encroachment C4-5 and C5-6 on the right from uncinate spurring. No significant encroachment on the left. UPPER CHEST: Lung apices are clear. OTHER: None. IMPRESSION: Normal head CT. Mild spondylitic change of the cervical spine without acute fracture. Electronically Signed   By: Ashley Royalty M.D.   On: 04/24/2018 03:42   Ct Cervical Spine Wo Contrast  Result Date: 04/24/2018 CLINICAL DATA:  Bruise to the left side of head. History of depression and anxiety. Seizures. EXAM: CT HEAD WITHOUT CONTRAST CT CERVICAL SPINE WITHOUT CONTRAST TECHNIQUE: Multidetector CT imaging of the head and cervical spine was performed following the standard protocol without intravenous contrast. Multiplanar CT image reconstructions of the cervical spine were also generated. COMPARISON:  07/24/2013 FINDINGS: CT HEAD FINDINGS BRAIN: The ventricles and sulci are normal. No intraparenchymal hemorrhage, mass effect nor midline shift. No acute large vascular territory infarcts. No abnormal extra-axial fluid collections. Basal cisterns are midline and not effaced. No acute cerebellar abnormality. VASCULAR: No hyperdense vessel sign. SKULL/SOFT TISSUES: No skull fracture. No significant soft tissue swelling. ORBITS/SINUSES: The included ocular globes and orbital contents are normal.The mastoid air-cells and included paranasal sinuses are well-aerated. OTHER: None. CT CERVICAL SPINE FINDINGS ALIGNMENT: Grade 1 anterolisthesis of C4 on C5 likely on the basis of degenerative disc disease and facet arthropathy. Maintained cervical lordosis otherwise. Intact  craniocervical relationship and atlantodental interval. SKULL BASE AND VERTEBRAE: Cervical vertebral bodies and posterior elements are intact. Moderate disc space narrowing C2-3, marked disc space narrowing C5-6 and moderate at C6-7. Mild facet hypertrophy on the left at C3-4 and on the right at C4-5. Mild uncovertebral joint osteoarthritis with uncinate spurring is seen on the right pad C4-5 and C5-6 as well as bilaterally at C6-7. No destructive bony lesions. C1-2 articulation maintained. SOFT TISSUES AND SPINAL CANAL: Normal. DISC LEVELS: No significant osseous canal stenosis. Mild to moderate encroachment C4-5 and C5-6 on the right from uncinate spurring. No significant encroachment on the left. UPPER CHEST: Lung apices are clear. OTHER: None. IMPRESSION: Normal head CT. Mild spondylitic change of the cervical spine without acute fracture. Electronically Signed   By: Ashley Royalty M.D.   On: 04/24/2018 03:42    Pending Labs Unresulted Labs (From admission, onward)   Start     Ordered   05/01/18 0500  Creatinine, serum  (enoxaparin (LOVENOX)    CrCl >/= 30 ml/min)  Weekly,   R    Comments:  while on enoxaparin therapy  04/24/18 0610   04/25/18 0500  Comprehensive metabolic panel  Tomorrow morning,   R     04/24/18 0610   04/25/18 0500  CBC  Tomorrow morning,   R     04/24/18 0610   04/24/18 0612  Vitamin B12  Add-on,   R     04/24/18 0611   04/24/18 0612  Sedimentation rate  Add-on,   R     04/24/18 0611   04/24/18 0612  TSH  Add-on,   R     04/24/18 0611   04/24/18 0612  RPR  Add-on,   R     04/24/18 0611      Vitals/Pain Today's Vitals   04/24/18 0500 04/24/18 0530 04/24/18 0603 04/24/18 0640  BP: 94/61 96/64  (!) 137/101  Pulse: (!) 56 (!) 59  (!) 55  Resp: _0 Temp:   (!) 97.3 F (36.3 C)   TempSrc:      SpO2: 97% 95%  99%  PainSc:        Isolation Precautions No active isolations  Medications Medications  Tdap (BOOSTRIX) injection 0.5 mL (0 mLs Intramuscular  Hold 04/24/18 0355)  enoxaparin (LOVENOX) injection 40 mg (has no administration in time range)  0.9 %  sodium chloride infusion (has no administration in time range)  acetaminophen (TYLENOL) tablet 650 mg (has no administration in time range)    Or  acetaminophen (TYLENOL) suppository 650 mg (has no administration in time range)  lidocaine-EPINEPHrine-tetracaine (LET) solution (3 mLs Topical Given 04/24/18 0352)    Mobility walks

## 2018-04-24 NOTE — Progress Notes (Signed)
CSW consulted to assist with IVC. CSW and MD [place] [facility] [place] [facility] [place] [facility] Patient served by [employer].   Noted psychiatry recommendation for inpatient psychiatric treatment. CSW to follow for psych placement when medically cleared.  Abigail ButtsSusan Krishauna Schatzman, LCSWA [phone]
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
## 2018-04-24 NOTE — H&P (Signed)
TRH H&P   Patient Demographics:    Doris Higgins, is a 62 y.o. female  MRN: [id number]   DOB - 01/28/1956  Admit Date - 04/24/2018  Outpatient Primary MD for the patient is Nolene Ebbs, MD  Referring MD/NP/PA: Ripley Fraise  Outpatient Specialists:     Patient coming from: home  Chief Complaint  Patient presents with  . Altered Mental Status      HPI:    Doris Higgins  is a 62 y.o. female, w Copd, seizure do, depression apparently presented with altered mental status and somnolence and mild hypothermia. Pt initially unable to give history but now says that she took something that her daughter gave her ?  In Ed,  CT brain/ c spine CT CERVICAL SPINE FINDINGS  ALIGNMENT: Grade 1 anterolisthesis of C4 on C5 likely on the basis of degenerative disc disease and facet arthropathy. Maintained cervical lordosis otherwise. Intact craniocervical relationship and atlantodental interval.  SKULL BASE AND VERTEBRAE: Cervical vertebral bodies and posterior elements are intact. Moderate disc space narrowing C2-3, marked disc space narrowing C5-6 and moderate at C6-7. Mild facet hypertrophy on the left at C3-4 and on the right at C4-5. Mild uncovertebral joint osteoarthritis with uncinate spurring is seen on the right pad C4-5 and C5-6 as well as bilaterally at C6-7. No destructive bony lesions. C1-2 articulation maintained.  SOFT TISSUES AND SPINAL CANAL: Normal.  DISC LEVELS: No significant osseous canal stenosis. Mild to moderate encroachment C4-5 and C5-6 on the right from uncinate spurring. No significant encroachment on the left.  UPPER CHEST: Lung apices are clear.  OTHER: None.  IMPRESSION: Normal head CT.  Mild spondylitic change of the cervical spine without acute fracture.  CXR IMPRESSION: 1. Shallow lung inflation with secondary mild left  basilar atelectasis. 2. No other active cardiopulmonary disease.   Xray R forearm IMPRESSION: No acute osseous abnormality about the right forearm.  Na 142, K 4.0 Bun 17, Creatinine 0.56 Ast 25, Alt 11 Wbc 6.9, Hgb 12.3, Plt 329  Etoh <92  Salicylate <4.2  UDS + benzodiazepine, and THC  Pt will be admitted for altered mental status likely due to benzo ingestion.     Review of systems:    In addition to the HPI above,   No Fever-chills, No Headache, No changes with Vision or hearing, No problems swallowing food or Liquids, No Chest pain, Cough or Shortness of Breath, No Abdominal pain, No Nausea or Vommitting, Bowel movements are regular, No Blood in stool or Urine, No dysuria, No new skin rashes or bruises, No new joints pains-aches,  No new weakness, tingling, numbness in any extremity, No recent weight gain or loss, No polyuria, polydypsia or polyphagia, No significant Mental Stressors.  A full 10 point Review of Systems was done, except as stated above, all other Review of Systems were negative.   With Past History of the following :  Past Medical History:  Diagnosis Date  . Anxiety   . Arthritis    "hands, arms, fingers, wrists, elbows, shoulders, all down my spine" (07/29/2017)  . Chronic back pain   . COPD (chronic obstructive pulmonary disease) ([place])    Archie Endo 07/28/2017  . Depression   . Glaucoma, both eyes   . [place] mal seizure ([place])    "since 2010; last one was 2016; never did find out why" (07/29/2017)  . Headache    "a few times/week" (07/28/2017)  . History of stomach ulcers 1978  . Menopause   . Migraine    "maybe 3-4 q 6 months" (07/28/2017)  . Pneumonia    "when I was a kid" (07/29/2017)  . Restless legs   . Suicide and self-inflicted injury [facility])       Past Surgical History:  Procedure Laterality Date  . ABDOMINAL HYSTERECTOMY  2000  . BILATERAL OOPHORECTOMY Bilateral 2007   for mass which was found to be non -malignant  per patient report  . CATARACT EXTRACTION W/PHACO Left 03/12/2014   Procedure: CATARACT EXTRACTION PHACO AND INTRAOCULAR LENS PLACEMENT ([facility]);  Surgeon: Tonny Branch, MD;  Location: [facility];  Service: Ophthalmology;  Laterality: Left;  CDE:7.05  . CATARACT EXTRACTION W/PHACO Right 07/23/2014   Procedure: CATARACT EXTRACTION PHACO AND INTRAOCULAR LENS PLACEMENT ([place]); CDE 0.37;  Surgeon: Williams Che, MD;  Location: [facility];  Service: Ophthalmology;  Laterality: Right;  CDE:  0.37  . TUBAL LIGATION  1984      Social History:     Social History   Tobacco Use  . Smoking status: Former Smoker    Packs/day: 1.00    Years: 40.00    Pack years: 40.00    Types: Cigarettes    Last attempt to quit: 01/27/2015    Years since quitting: 3.2  . Smokeless tobacco: Never Used  Substance Use Topics  . Alcohol use: No     Lives - at home  Mobility - walks by self   Family History :     Family History  Problem Relation Age of Onset  . Alcohol abuse Mother   . Depression Mother   . Alcohol abuse Father   . Cancer Father        lung ca  . Cancer Maternal Aunt        breast  . Cancer Paternal Aunt        breast       Home Medications:   Prior to Admission medications   Medication Sig Start Date End Date Taking? Authorizing Provider  albuterol (PROVENTIL HFA;VENTOLIN HFA) 108 (90 Base) MCG/ACT inhaler Inhale 2 puffs into the lungs every 6 (six) hours as needed for wheezing or shortness of breath. 07/30/17   Dessa Phi, DO  albuterol (PROVENTIL HFA;VENTOLIN HFA) 108 (90 Base) MCG/ACT inhaler Inhale 1-2 puffs into the lungs every 6 (six) hours as needed for wheezing or shortness of breath. 02/26/18   Charlesetta Shanks, MD  citalopram (CELEXA) 20 MG tablet Take 20 mg by mouth daily.    [provider]  gabapentin (NEURONTIN) 600 MG tablet Take 600 mg by mouth 2 (two) times daily.     [provider]  hydrOXYzine (ATARAX/VISTARIL) 50 MG tablet Take 50 mg by mouth at  bedtime as needed (sleep).  01/07/18   [provider]  ibuprofen (ADVIL,MOTRIN) 200 MG tablet Take 400 mg by mouth daily as needed for headache.    [provider]  ipratropium (ATROVENT HFA) 17  MCG/ACT inhaler Inhale 2 puffs into the lungs 4 (four) times daily. 07/30/17 02/26/18  Dessa Phi, DO  levETIRAcetam (KEPPRA) 500 MG tablet Take 1 tablet (500 mg total) by mouth 2 (two) times daily. 01/26/18   Blanchie Dessert, MD  pramipexole (MIRAPEX) 1 MG tablet Take 1 mg by mouth at bedtime. 11/06/17   [provider]  promethazine (PHENERGAN) 25 MG tablet Take 25 mg by mouth 2 (two) times daily as needed for nausea/vomiting. 01/07/18   [provider]  vitamin C (ASCORBIC ACID) 500 MG tablet Take 500 mg by mouth daily.    [provider]     Allergies:    No Known Allergies   Physical Exam:   Vitals  Blood pressure 96/64, pulse (!) 59, temperature (!) 97.3 F (36.3 C), resp. rate 17, SpO2 95 %.   1. General  lying in bed in NAD,    2. Normal affect and insight, Not Suicidal or Homicidal, Awake Alert, Oriented X 3.  3. No F.N deficits, ALL C.Nerves Intact, Strength 5/5 all 4 extremities, Sensation intact all 4 extremities, Plantars down going.  4. Ears and Eyes appear Normal, Conjunctivae clear, PERRLA. Moist Oral Mucosa.  5. Supple Neck, No JVD, No cervical lymphadenopathy appriciated, No Carotid Bruits.  6. Symmetrical Chest wall movement, Good air movement bilaterally, CTAB.  7. RRR, No Gallops, Rubs or Murmurs, No Parasternal Heave.  8. Positive Bowel Sounds, Abdomen Soft, No tenderness, No organomegaly appriciated,No rebound -guarding or rigidity.  9.  No Cyanosis, Normal Skin Turgor, No Skin Rash or Bruise.  10. Good muscle tone,  joints appear normal , no effusions, Normal ROM.  11. No Palpable Lymph Nodes in Neck or Axillae  Pt is currently conversant but drifts off to sleep very easily   Data Review:    CBC Recent Labs   Lab 04/24/18 0158  WBC 6.9  HGB 12.3  HCT 36.7  PLT 262  MCV 87.4  MCH 29.3  MCHC 33.5  RDW 14.2   ------------------------------------------------------------------------------------------------------------------  Chemistries  Recent Labs  Lab 04/24/18 0158  NA 142  K 4.0  CL 108  CO2 27  GLUCOSE 89  BUN 17  CREATININE 0.56  CALCIUM 8.9  AST 25  ALT 11  ALKPHOS 71  BILITOT 0.8   ------------------------------------------------------------------------------------------------------------------ CrCl cannot be calculated (Unknown ideal weight.). ------------------------------------------------------------------------------------------------------------------ No results for input(s): TSH, T4TOTAL, T3FREE, THYROIDAB in the last 72 hours.  Invalid input(s): FREET3  Coagulation profile No results for input(s): INR, PROTIME in the last 168 hours. ------------------------------------------------------------------------------------------------------------------- No results for input(s): DDIMER in the last 72 hours. -------------------------------------------------------------------------------------------------------------------  Cardiac Enzymes No results for input(s): CKMB, TROPONINI, MYOGLOBIN in the last 168 hours.  Invalid input(s): CK ------------------------------------------------------------------------------------------------------------------ No results found for: BNP   ---------------------------------------------------------------------------------------------------------------  Urinalysis    Component Value Date/Time   COLORURINE YELLOW 04/24/2018 0352   APPEARANCEUR CLEAR 04/24/2018 0352   LABSPEC 1.018 04/24/2018 0352   PHURINE 6.0 04/24/2018 0352   GLUCOSEU NEGATIVE 04/24/2018 0352   HGBUR NEGATIVE 04/24/2018 0352   BILIRUBINUR NEGATIVE 04/24/2018 0352   KETONESUR NEGATIVE 04/24/2018 0352   PROTEINUR NEGATIVE 04/24/2018 0352   UROBILINOGEN 0.2  07/14/2010 2135   NITRITE NEGATIVE 04/24/2018 0352   LEUKOCYTESUR NEGATIVE 04/24/2018 0352    ----------------------------------------------------------------------------------------------------------------   Imaging Results:    Dg Chest 1 View  Result Date: 04/24/2018 CLINICAL DATA:  Initial evaluation for acute pain. EXAM: CHEST  1 VIEW COMPARISON:  Prior radiograph from 02/26/2018. FINDINGS: Cardiac and mediastinal silhouettes are stable in  size and contour, and remain within normal limits. Mild aortic atherosclerosis. Lungs hypoinflated. Associated left basilar atelectasis. No other focal airspace disease. No pulmonary edema or pleural effusion. No pneumothorax. No acute osseus abnormality. IMPRESSION: 1. Shallow lung inflation with secondary mild left basilar atelectasis. 2. No other active cardiopulmonary disease. Electronically Signed   By: Jeannine Boga M.D.   On: 04/24/2018 03:34   Dg Forearm Right  Result Date: 04/24/2018 CLINICAL DATA:  Initial evaluation for acute pain. EXAM: RIGHT FOREARM - 2 VIEW COMPARISON:  None. FINDINGS: No acute fracture or dislocation. Mild degenerative changes noted about the wrist and elbow. Osseous mineralization normal. No soft tissue abnormality. IMPRESSION: No acute osseous abnormality about the right forearm. Electronically Signed   By: Jeannine Boga M.D.   On: 04/24/2018 03:31   Ct Head Wo Contrast  Result Date: 04/24/2018 CLINICAL DATA:  Bruise to the left side of head. History of depression and anxiety. Seizures. EXAM: CT HEAD WITHOUT CONTRAST CT CERVICAL SPINE WITHOUT CONTRAST TECHNIQUE: Multidetector CT imaging of the head and cervical spine was performed following the standard protocol without intravenous contrast. Multiplanar CT image reconstructions of the cervical spine were also generated. COMPARISON:  07/24/2013 FINDINGS: CT HEAD FINDINGS BRAIN: The ventricles and sulci are normal. No intraparenchymal hemorrhage, mass effect nor  midline shift. No acute large vascular territory infarcts. No abnormal extra-axial fluid collections. Basal cisterns are midline and not effaced. No acute cerebellar abnormality. VASCULAR: No hyperdense vessel sign. SKULL/SOFT TISSUES: No skull fracture. No significant soft tissue swelling. ORBITS/SINUSES: The included ocular globes and orbital contents are normal.The mastoid air-cells and included paranasal sinuses are well-aerated. OTHER: None. CT CERVICAL SPINE FINDINGS ALIGNMENT: Grade 1 anterolisthesis of C4 on C5 likely on the basis of degenerative disc disease and facet arthropathy. Maintained cervical lordosis otherwise. Intact craniocervical relationship and atlantodental interval. SKULL BASE AND VERTEBRAE: Cervical vertebral bodies and posterior elements are intact. Moderate disc space narrowing C2-3, marked disc space narrowing C5-6 and moderate at C6-7. Mild facet hypertrophy on the left at C3-4 and on the right at C4-5. Mild uncovertebral joint osteoarthritis with uncinate spurring is seen on the right pad C4-5 and C5-6 as well as bilaterally at C6-7. No destructive bony lesions. C1-2 articulation maintained. SOFT TISSUES AND SPINAL CANAL: Normal. DISC LEVELS: No significant osseous canal stenosis. Mild to moderate encroachment C4-5 and C5-6 on the right from uncinate spurring. No significant encroachment on the left. UPPER CHEST: Lung apices are clear. OTHER: None. IMPRESSION: Normal head CT. Mild spondylitic change of the cervical spine without acute fracture. Electronically Signed   By: Ashley Royalty M.D.   On: 04/24/2018 03:42   Ct Cervical Spine Wo Contrast  Result Date: 04/24/2018 CLINICAL DATA:  Bruise to the left side of head. History of depression and anxiety. Seizures. EXAM: CT HEAD WITHOUT CONTRAST CT CERVICAL SPINE WITHOUT CONTRAST TECHNIQUE: Multidetector CT imaging of the head and cervical spine was performed following the standard protocol without intravenous contrast. Multiplanar CT  image reconstructions of the cervical spine were also generated. COMPARISON:  07/24/2013 FINDINGS: CT HEAD FINDINGS BRAIN: The ventricles and sulci are normal. No intraparenchymal hemorrhage, mass effect nor midline shift. No acute large vascular territory infarcts. No abnormal extra-axial fluid collections. Basal cisterns are midline and not effaced. No acute cerebellar abnormality. VASCULAR: No hyperdense vessel sign. SKULL/SOFT TISSUES: No skull fracture. No significant soft tissue swelling. ORBITS/SINUSES: The included ocular globes and orbital contents are normal.The mastoid air-cells and included paranasal sinuses are well-aerated. OTHER: None. CT  CERVICAL SPINE FINDINGS ALIGNMENT: Grade 1 anterolisthesis of C4 on C5 likely on the basis of degenerative disc disease and facet arthropathy. Maintained cervical lordosis otherwise. Intact craniocervical relationship and atlantodental interval. SKULL BASE AND VERTEBRAE: Cervical vertebral bodies and posterior elements are intact. Moderate disc space narrowing C2-3, marked disc space narrowing C5-6 and moderate at C6-7. Mild facet hypertrophy on the left at C3-4 and on the right at C4-5. Mild uncovertebral joint osteoarthritis with uncinate spurring is seen on the right pad C4-5 and C5-6 as well as bilaterally at C6-7. No destructive bony lesions. C1-2 articulation maintained. SOFT TISSUES AND SPINAL CANAL: Normal. DISC LEVELS: No significant osseous canal stenosis. Mild to moderate encroachment C4-5 and C5-6 on the right from uncinate spurring. No significant encroachment on the left. UPPER CHEST: Lung apices are clear. OTHER: None. IMPRESSION: Normal head CT. Mild spondylitic change of the cervical spine without acute fracture. Electronically Signed   By: Ashley Royalty M.D.   On: 04/24/2018 03:42    Ekg  nsr at 61, borderline LAD, ? prlonged qtc, no st-t changes c/w ischemia    Assessment & Plan:    Active Problems:   Altered mental status    AMS likely  due to benzodiazepine and THC CT brain wnl Check b12, esr, tsh, rpr Monitor  Bradycardia and prolonged QTc Telemetry Check tsh Hold Celexa  Repeat 12 lead ekg in AM  Hypothermia resolved  Seizure do Cont Keppra  Depression, mood disorder, polysubstance abuse Hold celexa Cont gabapentin  DVT Prophylaxis  Lovenox - SCDs  AM Labs Ordered, also please review Full Orders  Family Communication: Admission, patients condition and plan of care including tests being ordered have been discussed with the patient who indicate understanding and agree with the plan and Code Status.  Code Status  FULL CODE  Likely DC to  home  Condition GUARDED    Consults called: none, consider psychiatry consult  Admission status: observation  Time spent in minutes : 60   Jani Gravel M.D on 04/24/2018 at 6:30 AM  Between 7am to 7pm - Pager - [phone]  After 7pm go to [email or web address] - password [facility]  [facility] - Office  [phone]
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
## 2018-04-24 NOTE — Progress Notes (Signed)
PROGRESS NOTE                                                                                                                                                                                                             Patient Demographics:    Doris Higgins, is a 62 y.o. female, DOB - 04-Jan-1956, ZOX:[id number]  Admit date - 04/24/2018   Admitting Physician Pearson GrippeJames Kim, MD  Outpatient Primary MD for the patient is Fleet ContrasAvbuere, Edwin, MD  LOS - 0   Chief Complaint  Patient presents with  . Altered Mental Status       Brief Narrative    No charge note for patient admitted earlier today   62 y.o. female, w Copd, seizure , depression apparently presented with altered mental status and somnolence and mild hypothermia.  Does endorse she took 3 Xanax from a friend, and per family patient was agitated, she did break multiple place in the house, blood was all over with laceration in the right forearm, they are concerned if it is being a suicide attempt, as well she does report some nursing staff about suicide ideas.    Subjective:    Doris Higgins today any fever, chills, chest pain or shortness of breath .    Assessment  & Plan :    Active Problems:   Altered mental status  Toxic encephalopathy -Patient presents somnolent, confused, CT head with no acute findings, she tested positive for THC and benzos, she reports she took 3 tablets of Xanax yesterday which since she has been somnolent. -Mentation status and lethargy has been improving, but currently actually she is more dated, combative, in this setting of psychiatric illness.  Bipolar disorder, suicide ideation -Psychiatric consult greatly appreciated, continue with one-to-one sitter for safety, will need inpatient psychiatric placement,. -Patient was combative this afternoon, and wanted to leave, so [facility]  Hypothermia -Resolved  Bradycardia and prolonged QTC -Refusing telemetry  currently, but her QTC this morning has improved to 430. -Resume on telemetry when she is accepting  History of seizures -Continue with Keppra          Code Status : Full  Family Communication  : None at bedside  Disposition Plan  : Pending psych consult   Consults  :  Psychiatry  Procedures  : none  DVT Prophylaxis  :  Lovenox  Lab Results  Component Value Date   PLT 262 04/24/2018    Antibiotics  :    Anti-infectives (From admission, onward)   None        Objective:   Vitals:   04/24/18 0603 04/24/18 0640 04/24/18 0809 04/24/18 0814  BP:  (!) 137/101 103/73   Pulse:  (!) 55 (!) 55   Resp:  17 18   Temp: (!) 97.3 F (36.3 C)     TempSrc:      SpO2:  99% 98%   Weight:    52.2 kg (115 lb)  Height:    5' (1.524 m)    Wt Readings from Last 3 Encounters:  04/24/18 52.2 kg (115 lb)  02/26/18 52.2 kg (115 lb)  07/28/17 49 kg (108 lb)     Intake/Output Summary (Last 24 hours) at 04/24/2018 1258 Last data filed at 04/24/2018 0900 Gross per 24 hour  Intake 240 ml  Output -  Net 240 ml     Physical Exam  Somnolent this morning, awake Answer questions in a confused manner then go back to sleep , sedated, combative in the afternoon . Symmetrical Chest wall movement, Good air movement bilaterally, CTAB RRR,No Gallops,Rubs or new Murmurs, No Parasternal Heave +ve B.Sounds, Abd Soft, No tenderness, No rebound - guarding or rigidity. No Cyanosis, Clubbing or edema, No new Rash or bruise , she has laceration on her right forearm, staples, no active bleed    Data Review:    CBC Recent Labs  Lab 04/24/18 0158  WBC 6.9  HGB 12.3  HCT 36.7  PLT 262  MCV 87.4  MCH 29.3  MCHC 33.5  RDW 14.2    Chemistries  Recent Labs  Lab 04/24/18 0158  NA 142  K 4.0  CL 108  CO2 27  GLUCOSE 89  BUN 17  CREATININE 0.56  CALCIUM 8.9  AST 25  ALT 11  ALKPHOS 71  BILITOT 0.8    ------------------------------------------------------------------------------------------------------------------ No results for input(s): CHOL, HDL, LDLCALC, TRIG, CHOLHDL, LDLDIRECT in the last 72 hours.  No results found for: HGBA1C ------------------------------------------------------------------------------------------------------------------ Recent Labs    04/24/18 0816  TSH 2.493   ------------------------------------------------------------------------------------------------------------------ Recent Labs    04/24/18 0816  VITAMINB12 412    Coagulation profile No results for input(s): INR, PROTIME in the last 168 hours.  No results for input(s): DDIMER in the last 72 hours.  Cardiac Enzymes No results for input(s): CKMB, TROPONINI, MYOGLOBIN in the last 168 hours.  Invalid input(s): CK ------------------------------------------------------------------------------------------------------------------ No results found for: BNP  Inpatient Medications  Scheduled Meds: . enoxaparin (LOVENOX) injection  40 mg Subcutaneous Q24H  . gabapentin  600 mg Oral BID  . ipratropium  0.5 mg Nebulization QID  . levETIRAcetam  500 mg Oral BID  . pramipexole  1 mg Oral QHS  . Tdap  0.5 mL Intramuscular Once   Continuous Infusions: . sodium chloride 75 mL/hr at 04/24/18 0909   PRN Meds:.acetaminophen **OR** acetaminophen, albuterol, hydrOXYzine  Micro Results No results found for this or any previous visit (from the past 240 hour(s)).  Radiology Reports Dg Chest 1 View  Result Date: 04/24/2018 CLINICAL DATA:  Initial evaluation for acute pain. EXAM: CHEST  1 VIEW COMPARISON:  Prior radiograph from 02/26/2018. FINDINGS: Cardiac and mediastinal silhouettes are stable in size and contour, and remain within normal limits. Mild aortic atherosclerosis. Lungs hypoinflated. Associated left basilar atelectasis. No other focal airspace disease. No pulmonary edema or pleural effusion.  No pneumothorax. No acute  osseus abnormality. IMPRESSION: 1. Shallow lung inflation with secondary mild left basilar atelectasis. 2. No other active cardiopulmonary disease. Electronically Signed   By: Rise Mu M.D.   On: 04/24/2018 03:34   Dg Forearm Right  Result Date: 04/24/2018 CLINICAL DATA:  Initial evaluation for acute pain. EXAM: RIGHT FOREARM - 2 VIEW COMPARISON:  None. FINDINGS: No acute fracture or dislocation. Mild degenerative changes noted about the wrist and elbow. Osseous mineralization normal. No soft tissue abnormality. IMPRESSION: No acute osseous abnormality about the right forearm. Electronically Signed   By: Rise Mu M.D.   On: 04/24/2018 03:31   Ct Head Wo Contrast  Result Date: 04/24/2018 CLINICAL DATA:  Bruise to the left side of head. History of depression and anxiety. Seizures. EXAM: CT HEAD WITHOUT CONTRAST CT CERVICAL SPINE WITHOUT CONTRAST TECHNIQUE: Multidetector CT imaging of the head and cervical spine was performed following the standard protocol without intravenous contrast. Multiplanar CT image reconstructions of the cervical spine were also generated. COMPARISON:  07/24/2013 FINDINGS: CT HEAD FINDINGS BRAIN: The ventricles and sulci are normal. No intraparenchymal hemorrhage, mass effect nor midline shift. No acute large vascular territory infarcts. No abnormal extra-axial fluid collections. Basal cisterns are midline and not effaced. No acute cerebellar abnormality. VASCULAR: No hyperdense vessel sign. SKULL/SOFT TISSUES: No skull fracture. No significant soft tissue swelling. ORBITS/SINUSES: The included ocular globes and orbital contents are normal.The mastoid air-cells and included paranasal sinuses are well-aerated. OTHER: None. CT CERVICAL SPINE FINDINGS ALIGNMENT: Grade 1 anterolisthesis of C4 on C5 likely on the basis of degenerative disc disease and facet arthropathy. Maintained cervical lordosis otherwise. Intact craniocervical  relationship and atlantodental interval. SKULL BASE AND VERTEBRAE: Cervical vertebral bodies and posterior elements are intact. Moderate disc space narrowing C2-3, marked disc space narrowing C5-6 and moderate at C6-7. Mild facet hypertrophy on the left at C3-4 and on the right at C4-5. Mild uncovertebral joint osteoarthritis with uncinate spurring is seen on the right pad C4-5 and C5-6 as well as bilaterally at C6-7. No destructive bony lesions. C1-2 articulation maintained. SOFT TISSUES AND SPINAL CANAL: Normal. DISC LEVELS: No significant osseous canal stenosis. Mild to moderate encroachment C4-5 and C5-6 on the right from uncinate spurring. No significant encroachment on the left. UPPER CHEST: Lung apices are clear. OTHER: None. IMPRESSION: Normal head CT. Mild spondylitic change of the cervical spine without acute fracture. Electronically Signed   By: Tollie Eth M.D.   On: 04/24/2018 03:42   Ct Cervical Spine Wo Contrast  Result Date: 04/24/2018 CLINICAL DATA:  Bruise to the left side of head. History of depression and anxiety. Seizures. EXAM: CT HEAD WITHOUT CONTRAST CT CERVICAL SPINE WITHOUT CONTRAST TECHNIQUE: Multidetector CT imaging of the head and cervical spine was performed following the standard protocol without intravenous contrast. Multiplanar CT image reconstructions of the cervical spine were also generated. COMPARISON:  07/24/2013 FINDINGS: CT HEAD FINDINGS BRAIN: The ventricles and sulci are normal. No intraparenchymal hemorrhage, mass effect nor midline shift. No acute large vascular territory infarcts. No abnormal extra-axial fluid collections. Basal cisterns are midline and not effaced. No acute cerebellar abnormality. VASCULAR: No hyperdense vessel sign. SKULL/SOFT TISSUES: No skull fracture. No significant soft tissue swelling. ORBITS/SINUSES: The included ocular globes and orbital contents are normal.The mastoid air-cells and included paranasal sinuses are well-aerated. OTHER: None. CT  CERVICAL SPINE FINDINGS ALIGNMENT: Grade 1 anterolisthesis of C4 on C5 likely on the basis of degenerative disc disease and facet arthropathy. Maintained cervical lordosis otherwise. Intact craniocervical relationship and atlantodental interval.  SKULL BASE AND VERTEBRAE: Cervical vertebral bodies and posterior elements are intact. Moderate disc space narrowing C2-3, marked disc space narrowing C5-6 and moderate at C6-7. Mild facet hypertrophy on the left at C3-4 and on the right at C4-5. Mild uncovertebral joint osteoarthritis with uncinate spurring is seen on the right pad C4-5 and C5-6 as well as bilaterally at C6-7. No destructive bony lesions. C1-2 articulation maintained. SOFT TISSUES AND SPINAL CANAL: Normal. DISC LEVELS: No significant osseous canal stenosis. Mild to moderate encroachment C4-5 and C5-6 on the right from uncinate spurring. No significant encroachment on the left. UPPER CHEST: Lung apices are clear. OTHER: None. IMPRESSION: Normal head CT. Mild spondylitic change of the cervical spine without acute fracture. Electronically Signed   By: Tollie Eth M.D.   On: 04/24/2018 03:42     Huey Bienenstock M.D on 04/24/2018 at 12:58 PM  Between 7am to 7pm - Pager - [phone]  After 7pm go to [email or web address] - password [facility]  [facility] -  Office  [phone]

## 2018-04-25 ENCOUNTER — Inpatient Hospital Stay (HOSPITAL_COMMUNITY)
Admission: AD | Admit: 2018-04-25 | Discharge: 2018-04-28 | DRG: 885 | Disposition: A | Discharge status: Home / Self Care | Payer: Medicare Other | Source: Intra-hospital | Attending: Psychiatry | Admitting: Psychiatry

## 2018-04-25 DIAGNOSIS — M549 Dorsalgia, unspecified: Secondary | ICD-10-CM | POA: Diagnosis present

## 2018-04-25 DIAGNOSIS — R4182 Altered mental status, unspecified: Secondary | ICD-10-CM | POA: Diagnosis present

## 2018-04-25 DIAGNOSIS — G40409 Other generalized epilepsy and epileptic syndromes, not intractable, without status epilepticus: Secondary | ICD-10-CM | POA: Diagnosis present

## 2018-04-25 DIAGNOSIS — G4700 Insomnia, unspecified: Secondary | ICD-10-CM | POA: Diagnosis present

## 2018-04-25 DIAGNOSIS — G2581 Restless legs syndrome: Secondary | ICD-10-CM | POA: Diagnosis present

## 2018-04-25 DIAGNOSIS — F410 Panic disorder [episodic paroxysmal anxiety] without agoraphobia: Secondary | ICD-10-CM | POA: Diagnosis not present

## 2018-04-25 DIAGNOSIS — G8929 Other chronic pain: Secondary | ICD-10-CM | POA: Diagnosis present

## 2018-04-25 DIAGNOSIS — Z818 Family history of other mental and behavioral disorders: Secondary | ICD-10-CM

## 2018-04-25 DIAGNOSIS — F332 Major depressive disorder, recurrent severe without psychotic features: Principal | ICD-10-CM | POA: Diagnosis present

## 2018-04-25 DIAGNOSIS — F411 Generalized anxiety disorder: Secondary | ICD-10-CM | POA: Diagnosis present

## 2018-04-25 DIAGNOSIS — F419 Anxiety disorder, unspecified: Secondary | ICD-10-CM | POA: Diagnosis not present

## 2018-04-25 DIAGNOSIS — Z801 Family history of malignant neoplasm of trachea, bronchus and lung: Secondary | ICD-10-CM

## 2018-04-25 DIAGNOSIS — Z811 Family history of alcohol abuse and dependence: Secondary | ICD-10-CM

## 2018-04-25 DIAGNOSIS — Z87891 Personal history of nicotine dependence: Secondary | ICD-10-CM | POA: Diagnosis not present

## 2018-04-25 DIAGNOSIS — S51811A Laceration without foreign body of right forearm, initial encounter: Secondary | ICD-10-CM | POA: Diagnosis not present

## 2018-04-25 DIAGNOSIS — T424X1A Poisoning by benzodiazepines, accidental (unintentional), initial encounter: Secondary | ICD-10-CM | POA: Diagnosis not present

## 2018-04-25 DIAGNOSIS — J449 Chronic obstructive pulmonary disease, unspecified: Secondary | ICD-10-CM | POA: Diagnosis present

## 2018-04-25 LAB — COMPREHENSIVE METABOLIC PANEL
ALK PHOS: 76 U/L (ref 38–126)
ALT: 11 U/L (ref 0–44)
ANION GAP: 7 (ref 5–15)
AST: 16 U/L (ref 15–41)
Albumin: 3.5 g/dL (ref 3.5–5.0)
BUN: 15 mg/dL (ref 6–20)
CALCIUM: 9.1 mg/dL (ref 8.9–10.3)
CO2: 26 mmol/L (ref 22–32)
Chloride: 110 mmol/L (ref 98–111)
Creatinine, Ser: 0.55 mg/dL (ref 0.44–1.00)
GFR calc non Af Amer: >60 mL/min (ref >60)
Glucose, Bld: 94 mg/dL (ref 70–99)
Potassium: 3.9 mmol/L (ref 3.5–5.1)
SODIUM: 143 mmol/L (ref 135–145)
Total Bilirubin: 0.3 mg/dL (ref 0.3–1.2)
Total Protein: 6.3 g/dL — ABNORMAL LOW (ref 6.5–8.1)

## 2018-04-25 LAB — CBC
HCT: 38.5 % (ref 36.0–46.0)
HEMOGLOBIN: 12.3 g/dL (ref 12.0–15.0)
MCH: 28.1 pg (ref 26.0–34.0)
MCHC: 31.9 g/dL (ref 30.0–36.0)
MCV: 88.1 fL (ref 78.0–100.0)
Platelets: 298 10*3/uL (ref 150–400)
RBC: 4.37 MIL/uL (ref 3.87–5.11)
RDW: 14.3 % (ref 11.5–15.5)
WBC: 6 10*3/uL (ref 4.0–10.5)

## 2018-04-25 LAB — RPR: RPR: NONREACTIVE

## 2018-04-25 LAB — MAGNESIUM: MAGNESIUM: 2 mg/dL (ref 1.7–2.4)

## 2018-04-25 MED ORDER — HYDROCODONE-ACETAMINOPHEN 5-325 MG PO TABS: 1.0000 | ORAL_TABLET | Freq: Four times a day (QID) | ORAL | 0 refills | Status: DC | PRN

## 2018-04-25 MED ORDER — IPRATROPIUM-ALBUTEROL 0.5-2.5 (3) MG/3ML IN SOLN
3.0000 mL | RESPIRATORY_TRACT | Status: DC | PRN
  Administered 2018-04-25: 3 mL via RESPIRATORY_TRACT
  Filled 2018-04-25: qty 3

## 2018-04-25 MED ORDER — HYDROXYZINE HCL 10 MG PO TABS
10.0000 mg | ORAL_TABLET | Freq: Two times a day (BID) | ORAL | Status: DC
  Administered 2018-04-26 – 2018-04-28 (×5): 10 mg via ORAL
  Filled 2018-04-25 (×9): qty 1

## 2018-04-25 MED ORDER — PRAMIPEXOLE DIHYDROCHLORIDE 1 MG PO TABS
1.0000 mg | ORAL_TABLET | Freq: Every day | ORAL | Status: DC
  Administered 2018-04-26 – 2018-04-27 (×2): 1 mg via ORAL
  Filled 2018-04-25 (×4): qty 1

## 2018-04-25 MED ORDER — ACETAMINOPHEN 325 MG PO TABS: 650.0000 mg | ORAL_TABLET | Freq: Four times a day (QID) | ORAL | Status: DC | PRN

## 2018-04-25 MED ORDER — ACETAMINOPHEN 325 MG PO TABS: 325.0000 mg | ORAL_TABLET | Freq: Four times a day (QID) | ORAL | Status: DC | PRN

## 2018-04-25 MED ORDER — MIRTAZAPINE 15 MG PO TABS
15.0000 mg | ORAL_TABLET | Freq: Every day | ORAL | Status: DC
  Filled 2018-04-25 (×2): qty 1

## 2018-04-25 MED ORDER — BACITRACIN ZINC 500 UNIT/GM EX OINT
TOPICAL_OINTMENT | Freq: Two times a day (BID) | CUTANEOUS | Status: DC
  Administered 2018-04-25 (×2): via TOPICAL
  Filled 2018-04-25: qty 28.35

## 2018-04-25 MED ORDER — BACITRACIN ZINC 500 UNIT/GM EX OINT: TOPICAL_OINTMENT | Freq: Two times a day (BID) | CUTANEOUS | 0 refills | Status: DC

## 2018-04-25 MED ORDER — GABAPENTIN 300 MG PO CAPS
600.0000 mg | ORAL_CAPSULE | Freq: Two times a day (BID) | ORAL | Status: DC
  Administered 2018-04-26 – 2018-04-28 (×5): 600 mg via ORAL
  Filled 2018-04-25 (×9): qty 2

## 2018-04-25 MED ORDER — HYDROCODONE-ACETAMINOPHEN 5-325 MG PO TABS
1.0000 | ORAL_TABLET | Freq: Four times a day (QID) | ORAL | Status: DC | PRN
  Administered 2018-04-25 (×2): 1 via ORAL
  Filled 2018-04-25 (×2): qty 1

## 2018-04-25 MED ORDER — IPRATROPIUM-ALBUTEROL 0.5-2.5 (3) MG/3ML IN SOLN: 3.0000 mL | RESPIRATORY_TRACT | Status: DC | PRN

## 2018-04-25 MED ORDER — LEVETIRACETAM 500 MG PO TABS
500.0000 mg | ORAL_TABLET | Freq: Two times a day (BID) | ORAL | Status: DC
  Administered 2018-04-26 – 2018-04-28 (×5): 500 mg via ORAL
  Filled 2018-04-25 (×9): qty 1

## 2018-04-25 MED ORDER — MIRTAZAPINE 15 MG PO TABS: 15.0000 mg | ORAL_TABLET | Freq: Every day | ORAL | Status: DC

## 2018-04-25 MED ORDER — BACITRACIN ZINC 500 UNIT/GM EX OINT
TOPICAL_OINTMENT | Freq: Two times a day (BID) | CUTANEOUS | Status: DC
  Administered 2018-04-26 – 2018-04-28 (×5): via TOPICAL
  Filled 2018-04-25 (×2): qty 28.35

## 2018-04-25 MED ORDER — HYDROCODONE-ACETAMINOPHEN 5-325 MG PO TABS
1.0000 | ORAL_TABLET | Freq: Four times a day (QID) | ORAL | Status: DC | PRN
  Administered 2018-04-26: 1 via ORAL
  Filled 2018-04-25 (×2): qty 1

## 2018-04-25 MED ORDER — HYDROXYZINE HCL 10 MG PO TABS: 10.0000 mg | ORAL_TABLET | Freq: Two times a day (BID) | ORAL | 0 refills | Status: DC

## 2018-04-25 NOTE — Progress Notes (Signed)
LCSW faxed patient out to inpatient psych facilities.  

## 2018-04-25 NOTE — Discharge Instructions (Signed)
Follow with Primary MD Fleet ContrasAvbuere, Edwin, MD [facility]  Get CBC, CMP,  checked  by Primary MD next visit.    Activity: As tolerated with Full fall precautions use walker/cane & assistance as needed   Disposition [facility]   Diet: regular diet  , with feeding assistance and aspiration precautions.   On your next visit with your primary care physician please Get Medicines reviewed and adjusted.   Please request your Prim.MD to go over all Hospital Tests and Procedure/Radiological results at the follow up, please get all Hospital records sent to your Prim MD by signing hospital release before you go home.   If you experience worsening of your admission symptoms, develop shortness of breath, life threatening emergency, suicidal or homicidal thoughts you must seek medical attention immediately by calling 911 or calling your MD immediately  if symptoms less severe.  You Must read complete instructions/literature along with all the possible adverse reactions/side effects for all the Medicines you take and that have been prescribed to you. Take any new Medicines after you have completely understood and accpet all the possible adverse reactions/side effects.   Do not drive, operating heavy machinery, perform activities at heights, swimming or participation in water activities or provide baby sitting services if your were admitted for syncope or siezures until you have seen by Primary MD or a Neurologist and advised to do so again.  Do not drive when taking Pain medications.    Do not take more than prescribed Pain, Sleep and Anxiety Medications  Special Instructions: If you have smoked or chewed Tobacco  in the last 2 yrs please stop smoking, stop any regular Alcohol  and or any Recreational drug use.  Wear Seat belts while driving.   Please note  You were cared for by a hospitalist during your hospital stay. If you have any questions about your discharge medications or the care  you received while you were in the hospital after you are discharged, you can call the unit and asked to speak with the hospitalist on call if the hospitalist that took care of you is not available. Once you are discharged, your primary care physician will handle any further medical issues. Please note that NO REFILLS for any discharge medications will be authorized once you are discharged, as it is imperative that you return to your primary care physician (or establish a relationship with a primary care physician if you do not have one) for your aftercare needs so that they can reassess your need for medications and monitor your lab values.

## 2018-04-25 NOTE — Discharge Summary (Signed)
Doris Higgins, is a 62 y.o. female  DOB Feb 11, 1956  MRN [id number].  Admission date:  04/24/2018  Admitting Physician  Pearson Grippe, MD  Discharge Date:  04/25/2018   Primary MD  Fleet Contras, MD  Recommendations for primary care physician for things to follow:  - please check CBC, BMP in one week.   Admission Diagnosis  Substance abuse (HCC) [F19.10] Delirium [R41.0] Laceration of right upper extremity with complication, initial encounter [S41.111A] Altered mental status [R41.82]   Discharge Diagnosis  Substance abuse (HCC) [F19.10] Delirium [R41.0] Laceration of right upper extremity with complication, initial encounter [S41.111A] Altered mental status [R41.82]    Principal Problem:   Major depressive disorder, recurrent episode, severe (HCC) Active Problems:   Altered mental status   Generalized anxiety disorder      Past Medical History:  Diagnosis Date  . Anxiety   . Arthritis    "hands, arms, fingers, wrists, elbows, shoulders, all down my spine" (07/29/2017)  . Chronic back pain   . COPD (chronic obstructive pulmonary disease) (HCC)    Doris Higgins 07/28/2017  . Depression   . Glaucoma, both eyes   . 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 Headache    "a few times/week" (07/28/2017)  . History of stomach ulcers 1978  . Menopause   . Migraine    "maybe 3-4 q 6 months" (07/28/2017)  . Pneumonia    "when I was a kid" (07/29/2017)  . Restless legs   . Suicide and self-inflicted injury [facility])     Past Surgical History:  Procedure Laterality Date  . ABDOMINAL HYSTERECTOMY  2000  . BILATERAL OOPHORECTOMY Bilateral 2007   for mass which was found to be non -malignant per patient report  . CATARACT EXTRACTION W/PHACO Left 03/12/2014   Procedure: CATARACT EXTRACTION PHACO AND INTRAOCULAR LENS PLACEMENT (IOC);  Surgeon: Gemma Payor, MD;  Location: [facility];  Service: Ophthalmology;  Laterality: Left;  CDE:7.05  . CATARACT EXTRACTION W/PHACO Right 07/23/2014   Procedure: CATARACT EXTRACTION PHACO AND INTRAOCULAR LENS PLACEMENT (IOC); CDE 0.37;  Surgeon: Susa Simmonds, MD;  Location: [facility];  Service: Ophthalmology;  Laterality: Right;  CDE:  0.37  . TUBAL LIGATION  1984       History of present illness and  Hospital Course:     Kindly see H&P for history of present illness and admission details, please review complete Labs, Consult reports and Test reports for all details in brief  HPI  from the history and physical done on the day of admission 04/24/2018  Doris Higgins  is a 62 y.o. female, w Copd, seizure do, depression apparently presented with altered mental status and somnolence and mild hypothermia. Pt initially unable to give history but now says that she took something that her daughter gave her ?  In Ed,  CT brain/ c spine CT CERVICAL SPINE FINDINGS  ALIGNMENT: Grade 1 anterolisthesis of C4 on C5 likely on the  basis of degenerative disc disease and facet arthropathy. Maintained cervical lordosis otherwise. Intact craniocervical relationship and atlantodental interval.  SKULL BASE AND VERTEBRAE: Cervical vertebral bodies and posterior elements are intact. Moderate disc space narrowing C2-3, marked disc space narrowing C5-6 and moderate at C6-7. Mild facet hypertrophy on the left at C3-4 and on the right at C4-5. Mild uncovertebral joint osteoarthritis with uncinate spurring is seen on the right pad C4-5 and C5-6 as well as bilaterally at C6-7. No destructive bony lesions. C1-2 articulation maintained.  SOFT TISSUES AND SPINAL CANAL: Normal.  DISC LEVELS: No significant osseous canal stenosis. Mild to moderate encroachment C4-5 and C5-6 on the right from uncinate spurring. No significant encroachment on the left.  UPPER CHEST: Lung apices are clear.  OTHER: None.  IMPRESSION: Normal head  CT.  Mild spondylitic change of the cervical spine without acute fracture.  CXR IMPRESSION: 1. Shallow lung inflation with secondary mild left basilar atelectasis. 2. No other active cardiopulmonary disease.   Xray R forearm IMPRESSION: No acute osseous abnormality about the right forearm.  Na 142, K 4.0 Bun 17, Creatinine 0.56 Ast 25, Alt 11 Wbc 6.9, Hgb 12.3, Plt 262  Etoh <10  Salicylate <7.0  UDS + benzodiazepine, and THC  Pt will be admitted for altered mental status likely due to benzo ingestion.      Hospital Course    62 y.o.female,w Copd, seizure , depression apparently presented with altered mental status and somnolence and mild hypothermia.  Does endorse she took 3 Xanax from a friend, and per family patient was agitated, she did break multiple place in the house, blood was all over with laceration in the right forearm, they are concerned if it is being a suicide attempt, as well she does report some nursing staff about suicide ideas.  He was IVC 04/24/2018, as she was adamant about leaving   Toxic encephalopathy -Patient presents somnolent, confused, CT head with no acute findings, she tested positive for THC and benzos, she reports she took 3 tablets of Xanax the morning of admission,for  which since she has been somnolent. -She is awake alert   Bipolar disorder, suicide ideation -Psychiatric consult greatly appreciated, continue with one-to-one sitter for safety, will need inpatient psychiatric placement,. -Patient was combative 04/24/2018 , and wanted to leave, so she was IVCed.  Hypothermia -Resolved  Bradycardia and prolonged QTC -Tachycardia on telemetry, QTC within normal limit x3, d/c telemtry   History of seizures -Continue with Keppra  Right forearm wound -Continue with bacitracin topical, staples can be removed in 10 days           Discharge Condition:  medically clear for inpatient psych admission        Discharge Instructions  and  Discharge Medications     Discharge Instructions    Discharge instructions   Complete by:  As directed    Follow with Primary MD Fleet Contras, MD after discharge from [facility].  Get CBC, CMP,  checked  by Primary MD next visit.    Activity: As tolerated with Full fall precautions use walker/cane & assistance as needed   Disposition [facility]   Diet: regular diet  , with feeding assistance and aspiration precautions.   On your next visit with your primary care physician please Get Medicines reviewed and adjusted.   Please request your Prim.MD to go over all Hospital Tests and Procedure/Radiological results at the follow up, please get all Hospital records sent to your Prim MD by signing hospital release before  you go home.   If you experience worsening of your admission symptoms, develop shortness of breath, life threatening emergency, suicidal or homicidal thoughts you must seek medical attention immediately by calling 911 or calling your MD immediately  if symptoms less severe.  You Must read complete instructions/literature along with all the possible adverse reactions/side effects for all the Medicines you take and that have been prescribed to you. Take any new Medicines after you have completely understood and accpet all the possible adverse reactions/side effects.   Do not drive, operating heavy machinery, perform activities at heights, swimming or participation in water activities or provide baby sitting services if your were admitted for syncope or siezures until you have seen by Primary MD or a Neurologist and advised to do so again.  Do not drive when taking Pain medications.    Do not take more than prescribed Pain, Sleep and Anxiety Medications  Special Instructions: If you have smoked or chewed Tobacco  in the last 2 yrs please stop smoking, stop any regular Alcohol  and or any Recreational drug use.  Wear Seat belts while  driving.   Please note  You were cared for by a hospitalist during your hospital stay. If you have any questions about your discharge medications or the care you received while you were in the hospital after you are discharged, you can call the unit and asked to speak with the hospitalist on call if the hospitalist that took care of you is not available. Once you are discharged, your primary care physician will handle any further medical issues. Please note that NO REFILLS for any discharge medications will be authorized once you are discharged, as it is imperative that you return to your primary care physician (or establish a relationship with a primary care physician if you do not have one) for your aftercare needs so that they can reassess your need for medications and monitor your lab values.   Increase activity slowly   Complete by:  As directed      Allergies as of 04/25/2018   No Known Allergies     Medication List    STOP taking these medications   citalopram 20 MG tablet Commonly known as:  CELEXA   meloxicam 7.5 MG tablet Commonly known as:  MOBIC   Potassium Chloride ER 20 MEQ Tbcr   potassium chloride SA 20 MEQ tablet Commonly known as:  K-DUR,KLOR-CON   predniSONE 10 MG tablet Commonly known as:  DELTASONE     TAKE these medications   acetaminophen 325 MG tablet Commonly known as:  TYLENOL Take 1 tablet (325 mg total) by mouth every 6 (six) hours as needed for mild pain (or Fever >/= 101).   albuterol 108 (90 Base) MCG/ACT inhaler Commonly known as:  PROVENTIL HFA;VENTOLIN HFA Inhale 2 puffs into the lungs every 6 (six) hours as needed for wheezing or shortness of breath. What changed:  how much to take   albuterol 108 (90 Base) MCG/ACT inhaler Commonly known as:  PROVENTIL HFA;VENTOLIN HFA Inhale 1-2 puffs into the lungs every 6 (six) hours as needed for wheezing or shortness of breath. What changed:  Another medication with the same name was changed. Make  sure you understand how and when to take each.   bacitracin ointment Apply topically 2 (two) times daily. Apply to right hand wound   gabapentin 600 MG tablet Commonly known as:  NEURONTIN Take 600 mg by mouth 2 (two) times daily.   HYDROcodone-acetaminophen 5-325 MG  tablet Commonly known as:  NORCO/VICODIN Take 1 tablet by mouth every 6 (six) hours as needed for moderate pain or severe pain.   hydrOXYzine 10 MG tablet Commonly known as:  ATARAX/VISTARIL Take 1 tablet (10 mg total) by mouth 2 (two) times daily. What changed:    medication strength  how much to take  when to take this  reasons to take this   ibuprofen 200 MG tablet Commonly known as:  ADVIL,MOTRIN Take 400 mg by mouth daily as needed for headache.   ipratropium 17 MCG/ACT inhaler Commonly known as:  ATROVENT HFA Inhale 2 puffs into the lungs 4 (four) times daily.   levETIRAcetam 500 MG tablet Commonly known as:  KEPPRA Take 1 tablet (500 mg total) by mouth 2 (two) times daily.   mirtazapine 15 MG tablet Commonly known as:  REMERON Take 1 tablet (15 mg total) by mouth at bedtime.   pramipexole 1 MG tablet Commonly known as:  MIRAPEX Take 1 mg by mouth at bedtime.   promethazine 25 MG tablet Commonly known as:  PHENERGAN Take 25 mg by mouth 2 (two) times daily as needed for nausea/vomiting.   vitamin C 500 MG tablet Commonly known as:  ASCORBIC ACID Take 500 mg by mouth daily.         Diet and Activity recommendation: See Discharge Instructions above   Consults obtained -  Psychiatry   Major procedures and Radiology Reports - PLEASE review detailed and final reports for all details, in brief -     Dg Chest 1 View  Result Date: 04/24/2018 CLINICAL DATA:  Initial evaluation for acute pain. EXAM: CHEST  1 VIEW COMPARISON:  Prior radiograph from 02/26/2018. FINDINGS: Cardiac and mediastinal silhouettes are stable in size and contour, and remain within normal limits. Mild aortic  atherosclerosis. Lungs hypoinflated. Associated left basilar atelectasis. No other focal airspace disease. No pulmonary edema or pleural effusion. No pneumothorax. No acute osseus abnormality. IMPRESSION: 1. Shallow lung inflation with secondary mild left basilar atelectasis. 2. No other active cardiopulmonary disease. Electronically Signed   By: Rise Mu M.D.   On: 04/24/2018 03:34   Dg Forearm Right  Result Date: 04/24/2018 CLINICAL DATA:  Initial evaluation for acute pain. EXAM: RIGHT FOREARM - 2 VIEW COMPARISON:  None. FINDINGS: No acute fracture or dislocation. Mild degenerative changes noted about the wrist and elbow. Osseous mineralization normal. No soft tissue abnormality. IMPRESSION: No acute osseous abnormality about the right forearm. Electronically Signed   By: Rise Mu M.D.   On: 04/24/2018 03:31   Ct Head Wo Contrast  Result Date: 04/24/2018 CLINICAL DATA:  Bruise to the left side of head. History of depression and anxiety. Seizures. EXAM: CT HEAD WITHOUT CONTRAST CT CERVICAL SPINE WITHOUT CONTRAST TECHNIQUE: Multidetector CT imaging of the head and cervical spine was performed following the standard protocol without intravenous contrast. Multiplanar CT image reconstructions of the cervical spine were also generated. COMPARISON:  07/24/2013 FINDINGS: CT HEAD FINDINGS BRAIN: The ventricles and sulci are normal. No intraparenchymal hemorrhage, mass effect nor midline shift. No acute large vascular territory infarcts. No abnormal extra-axial fluid collections. Basal cisterns are midline and not effaced. No acute cerebellar abnormality. VASCULAR: No hyperdense vessel sign. SKULL/SOFT TISSUES: No skull fracture. No significant soft tissue swelling. ORBITS/SINUSES: The included ocular globes and orbital contents are normal.The mastoid air-cells and included paranasal sinuses are well-aerated. OTHER: None. CT CERVICAL SPINE FINDINGS ALIGNMENT: Grade 1 anterolisthesis of C4 on  C5 likely on the basis of degenerative disc  disease and facet arthropathy. Maintained cervical lordosis otherwise. Intact craniocervical relationship and atlantodental interval. SKULL BASE AND VERTEBRAE: Cervical vertebral bodies and posterior elements are intact. Moderate disc space narrowing C2-3, marked disc space narrowing C5-6 and moderate at C6-7. Mild facet hypertrophy on the left at C3-4 and on the right at C4-5. Mild uncovertebral joint osteoarthritis with uncinate spurring is seen on the right pad C4-5 and C5-6 as well as bilaterally at C6-7. No destructive bony lesions. C1-2 articulation maintained. SOFT TISSUES AND SPINAL CANAL: Normal. DISC LEVELS: No significant osseous canal stenosis. Mild to moderate encroachment C4-5 and C5-6 on the right from uncinate spurring. No significant encroachment on the left. UPPER CHEST: Lung apices are clear. OTHER: None. IMPRESSION: Normal head CT. Mild spondylitic change of the cervical spine without acute fracture. Electronically Signed   By: Tollie Ethavid  Kwon M.D.   On: 04/24/2018 03:42   Ct Cervical Spine Wo Contrast  Result Date: 04/24/2018 CLINICAL DATA:  Bruise to the left side of head. History of depression and anxiety. Seizures. EXAM: CT HEAD WITHOUT CONTRAST CT CERVICAL SPINE WITHOUT CONTRAST TECHNIQUE: Multidetector CT imaging of the head and cervical spine was performed following the standard protocol without intravenous contrast. Multiplanar CT image reconstructions of the cervical spine were also generated. COMPARISON:  07/24/2013 FINDINGS: CT HEAD FINDINGS BRAIN: The ventricles and sulci are normal. No intraparenchymal hemorrhage, mass effect nor midline shift. No acute large vascular territory infarcts. No abnormal extra-axial fluid collections. Basal cisterns are midline and not effaced. No acute cerebellar abnormality. VASCULAR: No hyperdense vessel sign. SKULL/SOFT TISSUES: No skull fracture. No significant soft tissue swelling. ORBITS/SINUSES: The  included ocular globes and orbital contents are normal.The mastoid air-cells and included paranasal sinuses are well-aerated. OTHER: None. CT CERVICAL SPINE FINDINGS ALIGNMENT: Grade 1 anterolisthesis of C4 on C5 likely on the basis of degenerative disc disease and facet arthropathy. Maintained cervical lordosis otherwise. Intact craniocervical relationship and atlantodental interval. SKULL BASE AND VERTEBRAE: Cervical vertebral bodies and posterior elements are intact. Moderate disc space narrowing C2-3, marked disc space narrowing C5-6 and moderate at C6-7. Mild facet hypertrophy on the left at C3-4 and on the right at C4-5. Mild uncovertebral joint osteoarthritis with uncinate spurring is seen on the right pad C4-5 and C5-6 as well as bilaterally at C6-7. No destructive bony lesions. C1-2 articulation maintained. SOFT TISSUES AND SPINAL CANAL: Normal. DISC LEVELS: No significant osseous canal stenosis. Mild to moderate encroachment C4-5 and C5-6 on the right from uncinate spurring. No significant encroachment on the left. UPPER CHEST: Lung apices are clear. OTHER: None. IMPRESSION: Normal head CT. Mild spondylitic change of the cervical spine without acute fracture. Electronically Signed   By: Tollie Ethavid  Kwon M.D.   On: 04/24/2018 03:42    Micro Results     No results found for this or any previous visit (from the past 240 hour(s)).     Today   Subjective:   Merrily BrittleLinda Kirkman today denies fever, chills, chest pain or shortness of breath .    Objective:   Blood pressure (!) 158/93, pulse 74, temperature 98.3 F (36.8 C), temperature source Oral, resp. rate 17, height 5' (1.524 m), weight 51.2 kg (112 lb 14 oz), SpO2 100 %.  No intake or output data in the 24 hours ending 04/25/18 1828  Exam Awake, sitting in recliner, more tearful today. Good air entry bilaterally, clear to auscultation  Regular rate and rhythm, no rubs murmurs gallops  +ve B.Sounds, Abd Soft, No tenderness, No rebound -  guarding or rigidity. No Cyanosis, Clubbing or edema, No new Rash or bruise , she has laceration on her right forearm, staples, no active bleed   Data Review   CBC w Diff:  Lab Results  Component Value Date   WBC 6.0 04/25/2018   HGB 12.3 04/25/2018   HCT 38.5 04/25/2018   PLT 298 04/25/2018   LYMPHOPCT 25 02/26/2018   MONOPCT 5 02/26/2018   EOSPCT 0 02/26/2018   BASOPCT 1 02/26/2018    CMP:  Lab Results  Component Value Date   NA 143 04/25/2018   K 3.9 04/25/2018   CL 110 04/25/2018   CO2 26 04/25/2018   BUN 15 04/25/2018   CREATININE 0.55 04/25/2018   PROT 6.3 (L) 04/25/2018   ALBUMIN 3.5 04/25/2018   BILITOT 0.3 04/25/2018   ALKPHOS 76 04/25/2018   AST 16 04/25/2018   ALT 11 04/25/2018  .   Total Time in preparing paper work, data evaluation and todays exam - 35 minutes  Huey Bienenstock M.D on 04/25/2018 at 6:28 PM  [facility]   Office  [phone]

## 2018-04-25 NOTE — Care Management Obs Status (Signed)
MEDICARE OBSERVATION STATUS NOTIFICATION   Patient Details  Name: Doris Higgins MRN: [id number] Date of Birth: 04/20/1956   Medicare Observation Status Notification Given:  Yes    Davis, Rhonda Lynn, RN 04/25/2018, 10:43 AM 

## 2018-04-25 NOTE — Care Management Obs Status (Signed)
MEDICARE OBSERVATION STATUS NOTIFICATION   Patient Details  Name: Cressie R Kirkman MRGerda Diss: [id number] Date of Birth: [birth year]   Medicare Observation Status Notification Given:  Yes    Golda AcreDavis, Anderia Lorenzo Lynn, RN 04/25/2018, 10:43 AM

## 2018-04-25 NOTE — Progress Notes (Signed)
Two officers in to transport pt, officers provided with all pt belongings, AVS and IVC forms. No acute distress noted.

## 2018-04-25 NOTE — Progress Notes (Signed)
PROGRESS NOTE                                                                                                                                                                                                             Patient Demographics:    Doris Higgins, is a 62 y.o. female, DOB - 09/19/1956, ZOX:[id number]  Admit date - 04/24/2018   Admitting Physician Pearson Grippe, MD  Outpatient Primary MD for the patient is Fleet Contras, MD  LOS - 0   Chief Complaint  Patient presents with  . Altered Mental Status       Brief Narrative     62 y.o. female, w Copd, seizure , depression apparently presented with altered mental status and somnolence and mild hypothermia.  Does endorse she took 3 Xanax from a friend, and per family patient was agitated, she did break multiple place in the house, blood was all over with laceration in the right forearm, they are concerned if it is being a suicide attempt, as well she does report some nursing staff about suicide ideas.  He was IVC 04/24/2018, as she was adamant about leaving    Subjective:    Doris Higgins today any fever, chills, chest pain or shortness of breath .    Assessment  & Plan :    Principal Problem:   Major depressive disorder, recurrent episode, severe (HCC) Active Problems:   Altered mental status   Generalized anxiety disorder  Toxic encephalopathy -Patient presents somnolent, confused, CT head with no acute findings, she tested positive for THC and benzos, she reports she took 3 tablets of Xanax the morning of admission,for  which since she has been somnolent. -She is awake alert for the lethargy  Bipolar disorder, suicide ideation -Psychiatric consult greatly appreciated, continue with one-to-one sitter for safety, will need inpatient psychiatric placement,. -Patient was combative 04/24/2018 , and wanted to leave, so she was IVCed.  Hypothermia -Resolved  Bradycardia and prolonged  QTC -Tachycardia on telemetry, QTC within normal limit x3, continue telemetry monitor  History of seizures -Continue with Keppra    It is medically clear for inpatient psych admission      Code Status : Full  Family Communication  : None at bedside  Disposition Plan  : patient is medically cleared for inpatient psych admission  Consults  :  Psychiatry  Procedures  : none  DVT Prophylaxis  :  Lovenox  Lab Results  Component Value Date   PLT 298 04/25/2018    Antibiotics  :    Anti-infectives (From admission, onward)   None        Objective:   Vitals:   04/24/18 1547 04/24/18 2200 04/25/18 0600 04/25/18 0841  BP:  132/82 127/81   Pulse: 61 69 72   Resp: 16 17 17    Temp:  98.1 F (36.7 C) 98.3 F (36.8 C)   TempSrc:  Oral Oral   SpO2: 99% 99% 97% 93%  Weight:   51.2 kg (112 lb 14 oz)   Height:        Wt Readings from Last 3 Encounters:  04/25/18 51.2 kg (112 lb 14 oz)  02/26/18 52.2 kg (115 lb)  07/28/17 49 kg (108 lb)     Intake/Output Summary (Last 24 hours) at 04/25/2018 1352 Last data filed at 04/24/2018 1400 Gross per 24 hour  Intake 300 ml  Output -  Net 300 ml     Physical Exam  Awake, sitting in recliner, more tearful today. Good air entry bilaterally, clear to auscultation  Regular rate and rhythm, no rubs murmurs gallops  +ve B.Sounds, Abd Soft, No tenderness, No rebound - guarding or rigidity. No Cyanosis, Clubbing or edema, No new Rash or bruise , she has laceration on her right forearm, staples, no active bleed    Data Review:    CBC Recent Labs  Lab 04/24/18 0158 04/25/18 0553  WBC 6.9 6.0  HGB 12.3 12.3  HCT 36.7 38.5  PLT 262 298  MCV 87.4 88.1  MCH 29.3 28.1  MCHC 33.5 31.9  RDW 14.2 14.3    Chemistries  Recent Labs  Lab 04/24/18 0158 04/25/18 0553  NA 142 143  K 4.0 3.9  CL 108 110  CO2 27 26  GLUCOSE 89 94  BUN 17 15  CREATININE 0.56 0.55  CALCIUM 8.9 9.1  MG  --  2.0  AST 25 16  ALT 11 11   ALKPHOS 71 76  BILITOT 0.8 0.3   ------------------------------------------------------------------------------------------------------------------ No results for input(s): CHOL, HDL, LDLCALC, TRIG, CHOLHDL, LDLDIRECT in the last 72 hours.  No results found for: HGBA1C ------------------------------------------------------------------------------------------------------------------ Recent Labs    04/24/18 0816  TSH 2.493   ------------------------------------------------------------------------------------------------------------------ Recent Labs    04/24/18 0816  VITAMINB12 412    Coagulation profile No results for input(s): INR, PROTIME in the last 168 hours.  No results for input(s): DDIMER in the last 72 hours.  Cardiac Enzymes No results for input(s): CKMB, TROPONINI, MYOGLOBIN in the last 168 hours.  Invalid input(s): CK ------------------------------------------------------------------------------------------------------------------ No results found for: BNP  Inpatient Medications  Scheduled Meds: . bacitracin   Topical BID  . enoxaparin (LOVENOX) injection  40 mg Subcutaneous Q24H  . gabapentin  600 mg Oral BID  . hydrOXYzine  10 mg Oral BID  . ipratropium  0.5 mg Nebulization BID  . levETIRAcetam  500 mg Oral BID  . mirtazapine  15 mg Oral QHS  . pramipexole  1 mg Oral QHS  . Tdap  0.5 mL Intramuscular Once   Continuous Infusions:  PRN Meds:.acetaminophen **OR** acetaminophen, albuterol, HYDROcodone-acetaminophen, LORazepam  Micro Results No results found for this or any previous visit (from the past 240 hour(s)).  Radiology Reports Dg Chest 1 View  Result Date: 04/24/2018 CLINICAL DATA:  Initial evaluation for acute pain. EXAM: CHEST  1 VIEW COMPARISON:  Prior radiograph from 02/26/2018.  FINDINGS: Cardiac and mediastinal silhouettes are stable in size and contour, and remain within normal limits. Mild aortic atherosclerosis. Lungs hypoinflated.  Associated left basilar atelectasis. No other focal airspace disease. No pulmonary edema or pleural effusion. No pneumothorax. No acute osseus abnormality. IMPRESSION: 1. Shallow lung inflation with secondary mild left basilar atelectasis. 2. No other active cardiopulmonary disease. Electronically Signed   By: Rise Mu M.D.   On: 04/24/2018 03:34   Dg Forearm Right  Result Date: 04/24/2018 CLINICAL DATA:  Initial evaluation for acute pain. EXAM: RIGHT FOREARM - 2 VIEW COMPARISON:  None. FINDINGS: No acute fracture or dislocation. Mild degenerative changes noted about the wrist and elbow. Osseous mineralization normal. No soft tissue abnormality. IMPRESSION: No acute osseous abnormality about the right forearm. Electronically Signed   By: Rise Mu M.D.   On: 04/24/2018 03:31   Ct Head Wo Contrast  Result Date: 04/24/2018 CLINICAL DATA:  Bruise to the left side of head. History of depression and anxiety. Seizures. EXAM: CT HEAD WITHOUT CONTRAST CT CERVICAL SPINE WITHOUT CONTRAST TECHNIQUE: Multidetector CT imaging of the head and cervical spine was performed following the standard protocol without intravenous contrast. Multiplanar CT image reconstructions of the cervical spine were also generated. COMPARISON:  07/24/2013 FINDINGS: CT HEAD FINDINGS BRAIN: The ventricles and sulci are normal. No intraparenchymal hemorrhage, mass effect nor midline shift. No acute large vascular territory infarcts. No abnormal extra-axial fluid collections. Basal cisterns are midline and not effaced. No acute cerebellar abnormality. VASCULAR: No hyperdense vessel sign. SKULL/SOFT TISSUES: No skull fracture. No significant soft tissue swelling. ORBITS/SINUSES: The included ocular globes and orbital contents are normal.The mastoid air-cells and included paranasal sinuses are well-aerated. OTHER: None. CT CERVICAL SPINE FINDINGS ALIGNMENT: Grade 1 anterolisthesis of C4 on C5 likely on the basis of  degenerative disc disease and facet arthropathy. Maintained cervical lordosis otherwise. Intact craniocervical relationship and atlantodental interval. SKULL BASE AND VERTEBRAE: Cervical vertebral bodies and posterior elements are intact. Moderate disc space narrowing C2-3, marked disc space narrowing C5-6 and moderate at C6-7. Mild facet hypertrophy on the left at C3-4 and on the right at C4-5. Mild uncovertebral joint osteoarthritis with uncinate spurring is seen on the right pad C4-5 and C5-6 as well as bilaterally at C6-7. No destructive bony lesions. C1-2 articulation maintained. SOFT TISSUES AND SPINAL CANAL: Normal. DISC LEVELS: No significant osseous canal stenosis. Mild to moderate encroachment C4-5 and C5-6 on the right from uncinate spurring. No significant encroachment on the left. UPPER CHEST: Lung apices are clear. OTHER: None. IMPRESSION: Normal head CT. Mild spondylitic change of the cervical spine without acute fracture. Electronically Signed   By: Tollie Eth M.D.   On: 04/24/2018 03:42   Ct Cervical Spine Wo Contrast  Result Date: 04/24/2018 CLINICAL DATA:  Bruise to the left side of head. History of depression and anxiety. Seizures. EXAM: CT HEAD WITHOUT CONTRAST CT CERVICAL SPINE WITHOUT CONTRAST TECHNIQUE: Multidetector CT imaging of the head and cervical spine was performed following the standard protocol without intravenous contrast. Multiplanar CT image reconstructions of the cervical spine were also generated. COMPARISON:  07/24/2013 FINDINGS: CT HEAD FINDINGS BRAIN: The ventricles and sulci are normal. No intraparenchymal hemorrhage, mass effect nor midline shift. No acute large vascular territory infarcts. No abnormal extra-axial fluid collections. Basal cisterns are midline and not effaced. No acute cerebellar abnormality. VASCULAR: No hyperdense vessel sign. SKULL/SOFT TISSUES: No skull fracture. No significant soft tissue swelling. ORBITS/SINUSES: The included ocular globes and  orbital contents are normal.The mastoid air-cells  and included paranasal sinuses are well-aerated. OTHER: None. CT CERVICAL SPINE FINDINGS ALIGNMENT: Grade 1 anterolisthesis of C4 on C5 likely on the basis of degenerative disc disease and facet arthropathy. Maintained cervical lordosis otherwise. Intact craniocervical relationship and atlantodental interval. SKULL BASE AND VERTEBRAE: Cervical vertebral bodies and posterior elements are intact. Moderate disc space narrowing C2-3, marked disc space narrowing C5-6 and moderate at C6-7. Mild facet hypertrophy on the left at C3-4 and on the right at C4-5. Mild uncovertebral joint osteoarthritis with uncinate spurring is seen on the right pad C4-5 and C5-6 as well as bilaterally at C6-7. No destructive bony lesions. C1-2 articulation maintained. SOFT TISSUES AND SPINAL CANAL: Normal. DISC LEVELS: No significant osseous canal stenosis. Mild to moderate encroachment C4-5 and C5-6 on the right from uncinate spurring. No significant encroachment on the left. UPPER CHEST: Lung apices are clear. OTHER: None. IMPRESSION: Normal head CT. Mild spondylitic change of the cervical spine without acute fracture. Electronically Signed   By: Tollie Ethavid  Kwon M.D.   On: 04/24/2018 03:42     Huey Bienenstockawood Halea Lieb M.D on 04/25/2018 at 1:52 PM  Between 7am to 7pm - Pager - [phone]  After 7pm go to [email or web address] - password [facility]  [facility] -  Office  [phone]

## 2018-04-25 NOTE — Progress Notes (Signed)
[facility]

## 2018-04-25 NOTE — Clinical Social Work Note (Signed)
Clinical Social Work Assessment  Patient Details  Name: Doris Higgins MRN: [id number] Date of Birth: [birth year]  Date of referral:  04/25/18               Reason for consult:  Suicide Risk/Attempt                Permission sought to share information with:  Chartered certified accountant granted to share information::  Yes, Verbal Permission Granted  Name::        Agency::  Psych facilities  Relationship::     Contact Information:     Housing/Transportation Living arrangements for the past 2 months:  Single Family Home Source of Information:  Patient Patient Interpreter Needed:  None Criminal Activity/Legal Involvement Pertinent to Current Situation/Hospitalization:  No - Comment as needed Significant Relationships:  Adult Children, Dependent Children Lives with:  Adult Children, Minor Children Do you feel safe going back to the place where you live?  Yes Need for family participation in patient care:  Yes (Comment)  Care giving concerns:  Patient is very emotional at time of assessment. Per patient's H&P Doris Higgins  is a 62 y.o. female, w Copd, seizure do, depression apparently presented with altered mental status and somnolence and mild hypothermia. Pt initially unable to give history but now says that she took something that her daughter gave her ?   Social Worker assessment / plan:  LCSW consulted for inpatient psych placement.   Patient admitted for Altered mental status. Psych recommending inpatient psych placement at dc.   LCSW met at bedside with patient. Patient has sitter for safety. Patient is very emotional and tearful. Patient is visibly upset and states " a stranger can make a decision about my life." Patient expressed concerns about her house and dogs. Patient reports that she just found out that her son is on drugs and she is concerned about her home while she is away. Patient reports that her adult son lives with her as well as her 77 yr old son.  According to patient, her 54 yo son is currently in [place] with her adult daughter.   Patient states that she does not work and receives disability. Patient states that she does not have any supports since her husband passed in 2017. Patient continues to deny SI and does not feel that she requires inpatient psych placement. Patient reports that she has never been to an inpatient psych facility. Patient denies community involvement with outpatient psychiatric provider.   PLAN: inpatient psych at dc. Patient is IVC at the time of assessment.   Employment status:  Disabled (Comment on whether or not currently receiving Disability) Insurance information:  Managed Medicare PT Recommendations:  Not assessed at this time Information / Referral to community resources:  Inpatient Psychiatric Care (Comment Required)  Patient/Family's Response to care:  Patient is upset.   Patient/Family's Understanding of and Emotional Response to Diagnosis, Current Treatment, and Prognosis:  Patient is upset about Psych recommendation. Patient is tearful and using fowl language at the time of assessment. Patient is IVC'd. Patient is not agreeable to recommendation for inpatient psych at dc.   Emotional Assessment Appearance:  Appears stated age Attitude/Demeanor/Rapport:  Angry, Crying Affect (typically observed):  Overwhelmed, Frustrated, Tearful/Crying Orientation:  Oriented to Self, Oriented to Place, Oriented to  Time, Oriented to Situation Alcohol / Substance use:  Illicit Drugs Psych involvement (Current and /or in the community):  No (Comment)  Discharge Needs  Concerns to be addressed:  No  discharge needs identified Readmission within the last 30 days:  No Current discharge risk:  None Barriers to Discharge:  Continued Medical Work up, Psych Bed not available   Doris Snare, LCSW 04/25/2018, 10:32 AM

## 2018-04-25 NOTE — Progress Notes (Addendum)
CS[facility] Number for report is: [phone] Pt's unit/room/bed number will be: Room 405 Bed 2 Accepting physician: Dr. Jola Babinskilary  Pt can arrive ASAP on 7/22 after 11pm  CSW will fax pt's IVC paperwork to [facility] RN at ph: [phone] ASAP.  Non-emergency [facility] transport will have to be called in at 10pm at [phone]; option 1 for English and then option 3 for transport.  CSW will update RN.  5:18 PM RN updated with instructions for calling transport at 10pm [facility].  6:18 PM CSW received a call from the pt's provider who was unaware [facility].  Provider creating D/C summary now.  Please reconsult if future social work needs arise.  CSW signing off, as social work intervention is no longer needed.  Dorothe PeaJonathan F. Luc Shammas, LCSW, LCAS, CSI Clinical Social Worker Ph: [phone]

## 2018-04-26 ENCOUNTER — Encounter (HOSPITAL_COMMUNITY): Payer: Self-pay

## 2018-04-26 ENCOUNTER — Other Ambulatory Visit: Payer: Self-pay

## 2018-04-26 DIAGNOSIS — Z811 Family history of alcohol abuse and dependence: Secondary | ICD-10-CM

## 2018-04-26 DIAGNOSIS — F419 Anxiety disorder, unspecified: Secondary | ICD-10-CM

## 2018-04-26 DIAGNOSIS — F332 Major depressive disorder, recurrent severe without psychotic features: Principal | ICD-10-CM

## 2018-04-26 DIAGNOSIS — R4182 Altered mental status, unspecified: Secondary | ICD-10-CM

## 2018-04-26 DIAGNOSIS — F410 Panic disorder [episodic paroxysmal anxiety] without agoraphobia: Secondary | ICD-10-CM

## 2018-04-26 DIAGNOSIS — G4700 Insomnia, unspecified: Secondary | ICD-10-CM

## 2018-04-26 DIAGNOSIS — S51811A Laceration without foreign body of right forearm, initial encounter: Secondary | ICD-10-CM

## 2018-04-26 DIAGNOSIS — Z818 Family history of other mental and behavioral disorders: Secondary | ICD-10-CM

## 2018-04-26 MED ORDER — IBUPROFEN 600 MG PO TABS
600.0000 mg | ORAL_TABLET | Freq: Four times a day (QID) | ORAL | Status: DC | PRN
  Administered 2018-04-26 – 2018-04-28 (×4): 600 mg via ORAL
  Filled 2018-04-26 (×5): qty 1

## 2018-04-26 MED ORDER — TRAZODONE HCL 50 MG PO TABS
50.0000 mg | ORAL_TABLET | Freq: Every evening | ORAL | Status: DC | PRN
  Administered 2018-04-26: 50 mg via ORAL
  Filled 2018-04-26: qty 1

## 2018-04-26 MED ORDER — TRAZODONE HCL 50 MG PO TABS
50.0000 mg | ORAL_TABLET | Freq: Once | ORAL | Status: DC
  Filled 2018-04-26 (×2): qty 1

## 2018-04-26 MED ORDER — MIRTAZAPINE 7.5 MG PO TABS
7.5000 mg | ORAL_TABLET | Freq: Every day | ORAL | Status: DC
  Administered 2018-04-26: 7.5 mg via ORAL
  Filled 2018-04-26 (×3): qty 1

## 2018-04-26 MED ORDER — ALBUTEROL SULFATE HFA 108 (90 BASE) MCG/ACT IN AERS
1.0000 | INHALATION_SPRAY | Freq: Four times a day (QID) | RESPIRATORY_TRACT | Status: DC | PRN
  Administered 2018-04-26 – 2018-04-27 (×3): 2 via RESPIRATORY_TRACT
  Filled 2018-04-26: qty 6.7

## 2018-04-26 MED ORDER — HYDROXYZINE HCL 50 MG PO TABS
50.0000 mg | ORAL_TABLET | Freq: Once | ORAL | Status: AC
  Administered 2018-04-26: 50 mg via ORAL
  Filled 2018-04-26 (×2): qty 1
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
## 2018-04-26 NOTE — [facility] Group Notes (Signed)
LCSW Group Therapy Note   04/26/2018  1:30PM  Type of Therapy/Topic: Group Therapy: Feelings about Diagnosis  Participation Level: Active   Description of Group:  This group will allow patients to explore their thoughts and feelings about diagnoses they have received. Patients will be guided to explore their level of understanding and acceptance of these diagnoses. Facilitator will encourage patients to process their thoughts and feelings about the reactions of others to their diagnosis and will guide patients in identifying ways to discuss their diagnosis with significant others in their lives. This group will be process-oriented, with patients participating in exploration of their own experiences, giving and receiving support, and processing challenge from other group members.  Therapeutic Goals: 1. Patient will demonstrate understanding of diagnosis as evidenced by identifying two or more symptoms of the disorder 2. Patient will be able to express two feelings regarding the diagnosis 3. Patient will demonstrate their ability to communicate their needs through discussion and/or role play  Summary of Patient Progress: Patient actively participated in group discussion today. She identified society's views of mental health diagnosis and addiction. She became very tearful when identifying her feelings regarding the way addiction affects her family.   Therapeutic Modalities:  Cognitive Behavioral Therapy Brief Therapy Feelings Identification    Roselyn Beringegina Franziska Podgurski, MSW, LCSW Clinical Social Worker Phone: [phone]
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
## 2018-04-26 NOTE — Progress Notes (Signed)
Introduced self to patient.  She denied SI/HI or A/V hallucinations.  She complained of pain in her right forearm.  She requested medications for sleep because she believed that she didn't take them.  Reminded her that she was given all her medications prior to leaving the hospital.  Requested sleeping medication from NP and received order of trazodone 50mg  one time.  Went back to give her the trazodone and norco but she had already fallen asleep.  We will continue to monitor the progress towards her goals.
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
## 2018-04-26 NOTE — Progress Notes (Signed)
Recreation Therapy Notes  Animal-Assisted Activity (AAA) Program Checklist/Progress Notes Patient Eligibility Criteria Checklist & Daily Group note for Rec Tx Intervention  Date: 7.23.19 Time: 1430 Location: 300 [address] Group Room   AAA/T Program Assumption of Risk Form signed by Patient/ or Parent Legal Guardian YES   Patient is free of allergies or sever asthma YES   Patient reports no fear of animals YES  Patient reports no history of cruelty to animals YES   Patient understands his/her participation is voluntary YES   Patient washes hands before animal contact YES   Patient washes hands after animal contact YES   Education: Hand Washing, Appropriate Animal Interaction   Education Outcome: Acknowledges understanding/In group clarification offered/Needs additional education.   Clinical Observations/Feedback:  Pt did not attend activity.    Doris Higgins, LRT/CTRS         Doris Higgins 04/26/2018 3:51 PM 

## 2018-04-26 NOTE — Plan of Care (Signed)
  Problem: Activity: Goal: Interest or engagement in activities will improve Outcome: Progressing   Problem: Safety: Goal: Periods of time without injury will increase Outcome: Progressing   Problem: Medication: Goal: Compliance with prescribed medication regimen will improve Outcome: Progressing  DAR NOTE: Patient is alert and oriented x 4.  Patient presents with anxious affect and mood.  Denies suicidal thoughts, auditory and visual hallucinations.  Rates depression at 5, hopelessness at 5, and anxiety at 8.  Maintained on routine safety checks.  Medications given as prescribed.  Support and encouragement offered as needed.  Attended group and participated.  States goal for today is "getting ready to go home."  Patient observed socializing with peers in the dayroom.  Staples to right forearm intact.  Patient is safe on and off the unit.

## 2018-04-27 MED ORDER — DULOXETINE HCL 20 MG PO CPEP
20.0000 mg | ORAL_CAPSULE | Freq: Every day | ORAL | Status: DC
  Administered 2018-04-27 – 2018-04-28 (×2): 20 mg via ORAL
  Filled 2018-04-27 (×6): qty 1

## 2018-04-27 MED ORDER — LIDOCAINE 5 % EX PTCH
1.0000 | MEDICATED_PATCH | Freq: Every day | CUTANEOUS | Status: DC
  Administered 2018-04-27 – 2018-04-28 (×2): 1 via TRANSDERMAL
  Filled 2018-04-27 (×5): qty 1

## 2018-04-27 MED ORDER — MIRTAZAPINE 15 MG PO TABS: 15.0000 mg | ORAL_TABLET | Freq: Every day | ORAL | Status: DC

## 2018-04-27 MED ORDER — MIRTAZAPINE 7.5 MG PO TABS
7.5000 mg | ORAL_TABLET | Freq: Every day | ORAL | Status: DC
  Administered 2018-04-27: 7.5 mg via ORAL
  Filled 2018-04-27 (×3): qty 1

## 2018-04-27 NOTE — [facility] Group Notes (Signed)
[facility] Group Notes:  (Nursing/MHT/Case Management/Adjunct)  Date:  04/27/2018  Time:  1315  Type of Therapy:  Psychoeducational Skills  Participation Level:  Active  Participation Quality:  Appropriate  Affect:  Appropriate  Cognitive:  Appropriate  Insight:  Appropriate  Engagement in Group:  Engaged  Modes of Intervention:  Discussion and Education  Summary of Progress/Problems:  Patient participated appropriately in group.  Earline MayotteKnight, Candice Tobey Shephard 04/27/2018, 2:27 PM

## 2018-04-27 NOTE — Plan of Care (Signed)
  Problem: Activity: Goal: Interest or engagement in activities will improve Outcome: Progressing   Problem: Coping: Goal: Ability to verbalize frustrations and anger appropriately will improve Outcome: Progressing   Problem: Safety: Goal: Periods of time without injury will increase Outcome: Progressing   DAR NOTE: Patient presents with anxious affect and depressed mood.  Pt complained of back pain stating nothing help except tramadol.  Reports  poor sleep, good appetite, high energy and good concentration. Denies auditory and visual hallucinations.  Rates depression at 5, hopelessness at 8, and anxiety at 7.  Maintained on routine safety checks.  Medications given as prescribed.  Support and encouragement offered as needed.  States goal for today is "finding my joy and preparing to work." Patient observed socializing with peers in the dayroom.  Offered no complaint.
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
## 2018-04-27 NOTE — Progress Notes (Addendum)
D: Pt was in the dayroom upon initial approach.  Pt presents with anxious affect and mood.  She smiles with interaction.  Pt reports her day "has been better than yesterday" and her goal is to "be able to go to sleep and sleep through the night."  Pt denies SI/HI, denies hallucinations, reports R arm pain of 6/10.  Pt has been seeking medication for sleep and anxiety tonight.  Pt has been visible in milieu interacting with peers and staff appropriately.  Pt attended evening group.    A: Introduced self to pt.  Actively listened to pt and offered support and encouragement. Medications administered per order.  On-call provider notified of pt's complaint of pain and PRN medication for pain was ordered and administered.  Pt complained of feeling tachycardic and "like I need my nebulizer."  Pt reported anxiety at this time.  O2 saturation was checked and was 100% on room air. Pulse was checked and it was 80 standing.  On-site provider notified of pt's complaint of anxiety and Vistaril 50 mg POX1 was ordered and administered. On-site provider was later notified of pt's complaint of difficulty sleeping and PRN medication for sleep was ordered and administered.  Fall prevention techniques reviewed with pt and pt verbalized understanding.  Q15 minute safety checks maintained.    R: Pt is safe on the unit.  Pt is compliant with medications.  Pt verbally contracts for safety.  Will continue to monitor and assess.

## 2018-04-27 NOTE — Tx Team (Signed)
Interdisciplinary Treatment and Diagnostic Plan Update  04/27/2018 Time of Session: [address] Doris Higgins MRN: [id number]  Principal Diagnosis: MDD (major depressive disorder), recurrent episode, severe (HCC)  Secondary Diagnoses: Principal Problem:   MDD (major depressive disorder), recurrent episode, severe (HCC) Active Problems:   Altered mental status   Generalized anxiety disorder   Current Medications:  Current Facility-Administered Medications  Medication Dose Route Frequency Provider Last Rate Last Dose  . albuterol (PROVENTIL HFA;VENTOLIN HFA) 108 (90 Base) MCG/ACT inhaler 1-2 puff  1-2 puff Inhalation Q6H PRN Cobos, Rockey Situ, MD   2 puff at 04/26/18 1847  . bacitracin ointment   Topical BID Thermon Leyland, NP      . DULoxetine (CYMBALTA) DR capsule 20 mg  20 mg Oral Daily Cobos, Rockey Situ, MD   20 mg at 04/27/18 1141  . gabapentin (NEURONTIN) capsule 600 mg  600 mg Oral BID Cobos, Rockey Situ, MD   600 mg at 04/27/18 0802  . hydrOXYzine (ATARAX/VISTARIL) tablet 10 mg  10 mg Oral BID Fransisca Kaufmann A, NP   10 mg at 04/27/18 0802  . ibuprofen (ADVIL,MOTRIN) tablet 600 mg  600 mg Oral Q6H PRN Cobos, Rockey Situ, MD   600 mg at 04/27/18 0612  . levETIRAcetam (KEPPRA) tablet 500 mg  500 mg Oral BID Fransisca Kaufmann A, NP   500 mg at 04/27/18 0802  . lidocaine (LIDODERM) 5 % 1 patch  1 patch Transdermal Daily Cobos, Rockey Situ, MD   1 patch at 04/27/18 1141  . mirtazapine (REMERON) tablet 7.5 mg  7.5 mg Oral QHS Cobos, Fernando A, MD      . pramipexole (MIRAPEX) tablet 1 mg  1 mg Oral QHS Fransisca Kaufmann A, NP   1 mg at 04/26/18 2106   PTA Medications: Medications Prior to Admission  Medication Sig Dispense Refill Last Dose  . acetaminophen (TYLENOL) 325 MG tablet Take 1 tablet (325 mg total) by mouth every 6 (six) hours as needed for mild pain (or Fever >/= 101).     Marland Kitchen albuterol (PROVENTIL HFA;VENTOLIN HFA) 108 (90 Base) MCG/ACT inhaler Inhale 2 puffs into the lungs every 6 (six) hours as  needed for wheezing or shortness of breath. (Patient taking differently: Inhale 3 puffs into the lungs every 6 (six) hours as needed for wheezing or shortness of breath. ) 1 Inhaler 0 Past Week at Unknown time  . albuterol (PROVENTIL HFA;VENTOLIN HFA) 108 (90 Base) MCG/ACT inhaler Inhale 1-2 puffs into the lungs every 6 (six) hours as needed for wheezing or shortness of breath. 1 Inhaler 0   . bacitracin ointment Apply topically 2 (two) times daily. Apply to right hand wound 120 g 0   . gabapentin (NEURONTIN) 600 MG tablet Take 600 mg by mouth 2 (two) times daily.    04/23/2018 at Unknown time  . HYDROcodone-acetaminophen (NORCO/VICODIN) 5-325 MG tablet Take 1 tablet by mouth every 6 (six) hours as needed for moderate pain or severe pain. 30 tablet 0   . hydrOXYzine (ATARAX/VISTARIL) 10 MG tablet Take 1 tablet (10 mg total) by mouth 2 (two) times daily. 30 tablet 0   . ibuprofen (ADVIL,MOTRIN) 200 MG tablet Take 400 mg by mouth daily as needed for headache.   04/23/2018 at Unknown time  . ipratropium (ATROVENT HFA) 17 MCG/ACT inhaler Inhale 2 puffs into the lungs 4 (four) times daily. 1 Inhaler 0 02/26/2018 at Unknown time  . levETIRAcetam (KEPPRA) 500 MG tablet Take 1 tablet (500 mg total) by mouth 2 (two)  times daily. 60 tablet 2 04/24/2018 at Unknown time  . mirtazapine (REMERON) 15 MG tablet Take 1 tablet (15 mg total) by mouth at bedtime.     . pramipexole (MIRAPEX) 1 MG tablet Take 1 mg by mouth at bedtime.  2 Past Week at Unknown time  . promethazine (PHENERGAN) 25 MG tablet Take 25 mg by mouth 2 (two) times daily as needed for nausea/vomiting.  2 Past Week at Unknown time  . vitamin C (ASCORBIC ACID) 500 MG tablet Take 500 mg by mouth daily.   04/23/2018 at Unknown time    Patient Stressors: Loss of Husband to stage 4 lung cancer in 2017 Medication change or noncompliance Traumatic event  Patient Strengths: Capable of independent living Communication skills General fund of  knowledge  Treatment Modalities: Medication Management, Group therapy, Case management,  1 to 1 session with clinician, Psychoeducation, Recreational therapy.   Physician Treatment Plan for Primary Diagnosis: MDD (major depressive disorder), recurrent episode, severe (HCC) Long Term Goal(s): Improvement in symptoms so as ready for discharge Improvement in symptoms so as ready for discharge   Short Term Goals: Ability to identify changes in lifestyle to reduce recurrence of condition will improve Ability to maintain clinical measurements within normal limits will improve Ability to identify and develop effective coping behaviors will improve Ability to maintain clinical measurements within normal limits will improve  Medication Management: Evaluate patient's response, side effects, and tolerance of medication regimen.  Therapeutic Interventions: 1 to 1 sessions, Unit Group sessions and Medication administration.  Evaluation of Outcomes: Progressing  Physician Treatment Plan for Secondary Diagnosis: Principal Problem:   MDD (major depressive disorder), recurrent episode, severe (HCC) Active Problems:   Altered mental status   Generalized anxiety disorder  Long Term Goal(s): Improvement in symptoms so as ready for discharge Improvement in symptoms so as ready for discharge   Short Term Goals: Ability to identify changes in lifestyle to reduce recurrence of condition will improve Ability to maintain clinical measurements within normal limits will improve Ability to identify and develop effective coping behaviors will improve Ability to maintain clinical measurements within normal limits will improve     Medication Management: Evaluate patient's response, side effects, and tolerance of medication regimen.  Therapeutic Interventions: 1 to 1 sessions, Unit Group sessions and Medication administration.  Evaluation of Outcomes: Progressing   RN Treatment Plan for Primary Diagnosis:  MDD (major depressive disorder), recurrent episode, severe (HCC) Long Term Goal(s): Knowledge of disease and therapeutic regimen to maintain health will improve  Short Term Goals: Ability to identify and develop effective coping behaviors will improve and Compliance with prescribed medications will improve  Medication Management: RN will administer medications as ordered by provider, will assess and evaluate patient's response and provide education to patient for prescribed medication. RN will report any adverse and/or side effects to prescribing provider.  Therapeutic Interventions: 1 on 1 counseling sessions, Psychoeducation, Medication administration, Evaluate responses to treatment, Monitor vital signs and CBGs as ordered, Perform/monitor CIWA, COWS, AIMS and Fall Risk screenings as ordered, Perform wound care treatments as ordered.  Evaluation of Outcomes: Progressing   LCSW Treatment Plan for Primary Diagnosis: MDD (major depressive disorder), recurrent episode, severe (HCC) Long Term Goal(s): Safe transition to appropriate next level of care at discharge, Engage patient in therapeutic group addressing interpersonal concerns.  Short Term Goals: Engage patient in aftercare planning with referrals and resources, Increase social support and Increase skills for wellness and recovery  Therapeutic Interventions: Assess for all discharge needs, 1 to  1 time with Child psychotherapist, Explore available resources and support systems, Assess for adequacy in community support network, Educate family and significant other(s) on suicide prevention, Complete Psychosocial Assessment, Interpersonal group therapy.  Evaluation of Outcomes: Progressing   Progress in Treatment: Attending groups: Yes. Participating in groups: Yes. Taking medication as prescribed: Yes. Toleration medication: Yes. Family/Significant other contact made: No, will contact:  cousin Patient understands diagnosis: Yes. Discussing  patient identified problems/goals with staff: Yes. Medical problems stabilized or resolved: Yes. Denies suicidal/homicidal ideation: Yes. Issues/concerns per patient self-inventory: No. Other: none  New problem(s) identified: No, Describe:  none  New Short Term/Long Term Goal(s):  Patient Goals:  "Find my joy and try new medications."  Discharge Plan or Barriers:   Reason for Continuation of Hospitalization: Depression Medication stabilization  Estimated Length of Stay: 1-3 days.  Attendees: Patient:Doris Higgins 04/27/2018   Physician: Dr Jama Flavors, MD 04/27/2018   Nursing: Quintella Reichert, RN 04/27/2018   RN Care Manager: 04/27/2018   Social Worker: Daleen Squibb, LCSW 04/27/2018   Recreational Therapist:  04/27/2018   Other:  04/27/2018   Other:  04/27/2018   Other: 04/27/2018        Scribe for Treatment Team: Lorri Frederick, LCSW 04/27/2018 3:27 PM

## 2018-04-27 NOTE — Progress Notes (Signed)
Patient ID: Doris Higgins, female   DOB: 1956/09/17, 62 y.o.   MRN: [id number]  D: Patient is pleasant on approach tonight. Reports some back pain at this time. Reports mood much improved from admission and states that she is suppose to be discharged tomorrow. Staples intact to right arm at this time. Antibiotic ointment appled. A: Staff will monitor on q 15 minute checks, follow treatment plan, and give medications as ordered. R: Motrin given at this time. Patient cooperative on the unit.

## 2018-04-27 NOTE — Progress Notes (Signed)
Recreation Therapy Notes  Date: 7.24.19 Time: 0930 Location: 300 [address] Dayroom  Group Topic: Stress Management  Goal Area(s) Addresses:  Patient will verbalize importance of using healthy stress management.  Patient will identify positive emotions associated with healthy stress management.   Intervention: Stress Management  Activity : Meditation.  LRT introduced the stress management technique of meditation.  LRT played Higgins meditation that focused on choices.  Patients were to follow along as meditation was read to fully engage in the activity.  Education:  Stress Management, Discharge Planning.   Education Outcome: Acknowledges edcuation/In group clarification offered/Needs additional education  Clinical Observations/Feedback: Pt did not attend group.    Doris Higgins, LRT/CTRS         Doris Higgins 04/27/2018 11:01 AM 
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
## 2018-04-27 NOTE — Progress Notes (Signed)
Adult Psychoeducational Group Note  Date:  04/27/2018 Time:  10:03 PM  Group Topic/Focus:  Wrap-Up Group:   The focus of this group is to help patients review their daily goal of treatment and discuss progress on daily workbooks.  Participation Level:  Active  Participation Quality:  Appropriate  Affect:  Appropriate  Cognitive:  Alert  Insight: Appropriate  Engagement in Group:  Engaged  Modes of Intervention:  Discussion  Additional Comments:  Patient's goal for today was to make sure she has somewhere to stay following discharge. Patient met goal.   Devanie Galanti L Jaretzy Lhommedieu 04/27/2018, 10:03 PM

## 2018-04-27 NOTE — [facility] Group Notes (Signed)
[facility] 04/27/2018 1:15pm  Type of Therapy: Mental Health Association Presentation  Participation Level: Active  Participation Quality: Attentive  Affect: Appropriate  Cognitive: Oriented  Insight: Developing/Improving  Engagement in Therapy: Engaged  Modes of Intervention: Discussion, Education and Socialization  Summary of Progress/Problems: Mental Health Association (MHA) Speaker came to talk about his personal journey with mental health. The pt processed ways by which to relate to the speaker. MHA speaker provided handouts and educational information pertaining to groups and services offered by [facility]. Pt was engaged in speaker's presentation and was receptive to resources provided.    Lorri FrederickWierda, Ezzie Senat Jon, LCSW 04/27/2018 12:25 PM

## 2018-04-27 NOTE — Plan of Care (Signed)
  Problem: Self-Concept: Goal: Level of anxiety will decrease Outcome: Not Progressing Note:  Pt complained of feeling anxious tonight and required Vistaril 50 mg POX1 to decrease anxiety.

## 2018-04-28 MED ORDER — ALBUTEROL SULFATE HFA 108 (90 BASE) MCG/ACT IN AERS: 2.0000 | INHALATION_SPRAY | Freq: Four times a day (QID) | RESPIRATORY_TRACT | 0 refills | Status: DC | PRN

## 2018-04-28 MED ORDER — LIDOCAINE 5 % EX PTCH: 1.0000 | MEDICATED_PATCH | Freq: Every day | CUTANEOUS | 0 refills | Status: DC

## 2018-04-28 MED ORDER — IPRATROPIUM BROMIDE HFA 17 MCG/ACT IN AERS: 2.0000 | INHALATION_SPRAY | Freq: Four times a day (QID) | RESPIRATORY_TRACT | 0 refills | Status: DC

## 2018-04-28 MED ORDER — HYDROXYZINE HCL 10 MG PO TABS: 10.0000 mg | ORAL_TABLET | Freq: Two times a day (BID) | ORAL | 0 refills | Status: DC

## 2018-04-28 MED ORDER — MIRTAZAPINE 7.5 MG PO TABS: 7.5000 mg | ORAL_TABLET | Freq: Every day | ORAL | 0 refills | Status: DC

## 2018-04-28 MED ORDER — GABAPENTIN 300 MG PO CAPS: 600.0000 mg | ORAL_CAPSULE | Freq: Two times a day (BID) | ORAL | 0 refills | Status: DC

## 2018-04-28 MED ORDER — DULOXETINE HCL 20 MG PO CPEP: 20.0000 mg | ORAL_CAPSULE | Freq: Every day | ORAL | 0 refills | Status: DC

## 2018-04-28 MED ORDER — ALBUTEROL SULFATE HFA 108 (90 BASE) MCG/ACT IN AERS: 1.0000 | INHALATION_SPRAY | Freq: Four times a day (QID) | RESPIRATORY_TRACT | 0 refills | Status: DC | PRN

## 2018-04-28 MED ORDER — ALBUTEROL SULFATE HFA 108 (90 BASE) MCG/ACT IN AERS
1.0000 | INHALATION_SPRAY | Freq: Four times a day (QID) | RESPIRATORY_TRACT | Status: DC | PRN
Start: 2018-04-28 — End: 2018-12-01

## 2018-04-28 MED ORDER — LEVETIRACETAM 500 MG PO TABS: 500.0000 mg | ORAL_TABLET | Freq: Two times a day (BID) | ORAL | 2 refills | Status: DC

## 2018-04-28 MED ORDER — LIDOCAINE 5 % EX PTCH: 1.0000 | MEDICATED_PATCH | Freq: Every day | CUTANEOUS | 0 refills | Status: AC

## 2018-04-28 MED ORDER — BACITRACIN ZINC 500 UNIT/GM EX OINT: TOPICAL_OINTMENT | Freq: Two times a day (BID) | CUTANEOUS | 0 refills | Status: DC
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
## 2018-04-28 NOTE — [facility] Suicide Risk Assessment (Signed)
[facility] INPATIENT:  Family/Significant Other Suicide Prevention Education  Suicide Prevention Education:  Education Completed;Doris FixBecky Higgins, cousin, [phone],  has been identified by the patient as the family member/significant other with whom the patient will be residing, and identified as the person(s) who will aid the patient in the event of a mental health crisis (suicidal ideations/suicide attempt).  With written consent from the patient, the family member/significant other has been provided the following suicide prevention education, prior to the and/or following the discharge of the patient.  The suicide prevention education provided includes the following:  Suicide risk factors  Suicide prevention and interventions  National Suicide Hotline telephone number  [facility] assessment telephone number  [facility] Emergency Assistance 911  [facility] and/or Residential Mobile Crisis Unit telephone number  Request made of family/significant other to:  Remove weapons (e.g., guns, rifles, knives), all items previously/currently identified as safety concern.  Not aware of any guns-Doris.  Remove drugs/medications (over-the-counter, prescriptions, illicit drugs), all items previously/currently identified as a safety concern.  The family member/significant other verbalizes understanding of the suicide prevention education information provided.  The family member/significant other agrees to remove the items of safety concern listed above.  Lorri FrederickWierda, Doris Burlison Jon, LCSW 04/28/2018, 10:54 AM

## 2018-04-28 NOTE — Progress Notes (Signed)
Discharge note:  Patient discharged home per MD order.  Reviewed AVS/transition record with patient and she indicated understanding.  She denies any thoughts of self harm.  Patient received all belongings from unit and room.  Patient left ambulatory with her best friend.

## 2018-04-28 NOTE — Progress Notes (Signed)
Pt attended goals and orientation group this morning. Pt participated and was active.   

## 2018-04-28 NOTE — Progress Notes (Addendum)
  [facility] :  Will you be returning to the same living situation after discharge:  Yes,  with son At discharge, do you have transportation home?: Yes,  friend Do you have the ability to pay for your medications: Yes,  [facility]  Release of information consent forms completed and in the chart;  Patient's signature needed at discharge.  Patient to Follow up at: Follow-up Information    [facility] [place] Go in 3 day(s).   Specialty:  Professional Counselor Why:  Please attend a walk in appt between 8:30am-2:30pm, Monday-Friday.  Please request therapy and medication management services. Contact information: [facility] [address] [place] [address] [phone]           Next level of care provider has [facility] to [facility] Link:no  Safety Planning and Suicide Prevention discussed: Yes, with cousin.  Have you used any form of tobacco in the last 30 days? (Cigarettes, Smokeless Tobacco, Cigars, and/or Pipes): No  Has patient been referred to the Quitline?: N/A patient is not a smoker  Patient has been referred for addiction treatment: Pt. refused referral  Lorri FrederickWierda, Murrel Bertram Jon, LCSW 04/28/2018, 10:48 AM

## 2018-04-28 NOTE — [facility] Suicide Risk Assessment (Signed)
[facility] INPATIENT:  Family/Significant Other Suicide Prevention Education  Suicide Prevention Education:  Contact Attempts: Lonni FixBecky Roach, cousin, [phone], has been identified by the patient as the family member/significant other with whom the patient will be residing, and identified as the person(s) who will aid the patient in the event of a mental health crisis.  With written consent from the patient, two attempts were made to provide suicide prevention education, prior to and/or following the patient's discharge.  We were unsuccessful in providing suicide prevention education.  A suicide education pamphlet was given to the patient to share with family/significant other.  Date and time of first attempt:04/28/18, [id number] Date and time of second attempt:  Lorri FrederickWierda, Amneet Cendejas Jon, LCSW 04/28/2018, 9:40 AM

## 2018-05-21 ENCOUNTER — Emergency Department (HOSPITAL_COMMUNITY)
Admission: EM | Admit: 2018-05-21 | Discharge: 2018-05-21 | Disposition: A | Discharge status: Home / Self Care | Payer: Medicare Other | Attending: Emergency Medicine | Admitting: Emergency Medicine

## 2018-05-21 ENCOUNTER — Encounter (HOSPITAL_COMMUNITY): Payer: Self-pay | Admitting: Emergency Medicine

## 2018-05-21 DIAGNOSIS — Z4802 Encounter for removal of sutures: Secondary | ICD-10-CM | POA: Insufficient documentation

## 2018-05-21 DIAGNOSIS — Z79899 Other long term (current) drug therapy: Secondary | ICD-10-CM | POA: Diagnosis not present

## 2018-05-21 DIAGNOSIS — F0390 Unspecified dementia without behavioral disturbance: Secondary | ICD-10-CM | POA: Diagnosis not present

## 2018-05-21 DIAGNOSIS — Z87891 Personal history of nicotine dependence: Secondary | ICD-10-CM | POA: Diagnosis not present
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
## 2018-05-21 NOTE — ED Notes (Signed)
Patient able to ambulate independently  

## 2018-05-21 NOTE — ED Triage Notes (Signed)
Patient to ED for staple removal - had 4 staples put in 05/05/18 to R forearm. Wound appears to have healed nicely, no signs of discharge or infection.

## 2018-08-11 ENCOUNTER — Other Ambulatory Visit: Payer: Self-pay | Admitting: Internal Medicine

## 2018-08-11 DIAGNOSIS — Z1231 Encounter for screening mammogram for malignant neoplasm of breast: Secondary | ICD-10-CM

## 2018-12-01 ENCOUNTER — Encounter: Payer: Self-pay | Admitting: Pulmonary Disease

## 2018-12-01 ENCOUNTER — Ambulatory Visit (INDEPENDENT_AMBULATORY_CARE_PROVIDER_SITE_OTHER)
Admission: RE | Admit: 2018-12-01 | Discharge: 2018-12-01 | Disposition: A | Discharge status: Home / Self Care | Payer: Medicare Other | Source: Ambulatory Visit | Attending: Pulmonary Disease | Admitting: Pulmonary Disease

## 2018-12-01 ENCOUNTER — Ambulatory Visit (INDEPENDENT_AMBULATORY_CARE_PROVIDER_SITE_OTHER): Payer: Medicare Other | Admitting: Pulmonary Disease

## 2018-12-01 VITALS — BP 104/64 | HR 75 | Ht 60.0 in | Wt 128.4 lb

## 2018-12-01 DIAGNOSIS — R0602 Shortness of breath: Secondary | ICD-10-CM

## 2018-12-01 DIAGNOSIS — Z87891 Personal history of nicotine dependence: Secondary | ICD-10-CM | POA: Diagnosis not present

## 2018-12-01 LAB — CBC WITH DIFFERENTIAL/PLATELET
BASOS PCT: 0.8 % (ref 0.0–3.0)
Basophils Absolute: 0.1 10*3/uL (ref 0.0–0.1)
Eosinophils Absolute: 0.2 10*3/uL (ref 0.0–0.7)
Eosinophils Relative: 3.3 % (ref 0.0–5.0)
HEMATOCRIT: 35.4 % — AB (ref 36.0–46.0)
Hemoglobin: 11.7 g/dL — ABNORMAL LOW (ref 12.0–15.0)
LYMPHS ABS: 2 10*3/uL (ref 0.7–4.0)
LYMPHS PCT: 29.4 % (ref 12.0–46.0)
MCHC: 33.2 g/dL (ref 30.0–36.0)
MCV: 85.6 fl (ref 78.0–100.0)
MONO ABS: 0.5 10*3/uL (ref 0.1–1.0)
Monocytes Relative: 6.7 % (ref 3.0–12.0)
NEUTROS ABS: 4.1 10*3/uL (ref 1.4–7.7)
NEUTROS PCT: 59.8 % (ref 43.0–77.0)
PLATELETS: 309 10*3/uL (ref 150.0–400.0)
RBC: 4.13 Mil/uL (ref 3.87–5.11)
RDW: 14.3 % (ref 11.5–15.5)
WBC: 6.9 10*3/uL (ref 4.0–10.5)

## 2018-12-01 MED ORDER — UMECLIDINIUM-VILANTEROL 62.5-25 MCG/INH IN AEPB: 1.0000 | INHALATION_SPRAY | Freq: Every day | RESPIRATORY_TRACT | 5 refills | Status: AC
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
## 2018-12-01 NOTE — Patient Instructions (Signed)
We will get a chest x-ray, CBC differential, IgE, alpha-1 antitrypsin levels and phenotype We will schedule you for pulmonary function test and start you on Anoro inhaler We will also refer you for low-dose screening CT of the chest  Follow-up in 2 to 4 weeks.

## 2018-12-01 NOTE — Addendum Note (Signed)
Addended by: Demetrio Lapping E on: 12/01/2018 12:21 PM   Modules accepted: Orders

## 2018-12-05 ENCOUNTER — Other Ambulatory Visit: Payer: Self-pay | Admitting: Acute Care

## 2018-12-05 DIAGNOSIS — Z122 Encounter for screening for malignant neoplasm of respiratory organs: Secondary | ICD-10-CM

## 2018-12-05 DIAGNOSIS — Z87891 Personal history of nicotine dependence: Secondary | ICD-10-CM

## 2018-12-13 LAB — ALPHA-1 ANTITRYPSIN PHENOTYPE: A-1 Antitrypsin, Ser: 178 mg/dL (ref 83–199)

## 2018-12-13 LAB — IGE: IgE (Immunoglobulin E), Serum: 18 kU/L (ref <=114)

## 2019-01-04 ENCOUNTER — Encounter: Payer: Self-pay | Admitting: Acute Care

## 2019-01-04 ENCOUNTER — Inpatient Hospital Stay: Admission: RE | Admit: 2019-01-04 | Payer: Self-pay | Source: Ambulatory Visit

## 2019-03-06 ENCOUNTER — Inpatient Hospital Stay: Admission: RE | Admit: 2019-03-06 | Payer: Medicare Other | Source: Ambulatory Visit

## 2019-03-06 ENCOUNTER — Encounter: Payer: Medicare Other | Admitting: Acute Care

## 2019-05-15 ENCOUNTER — Encounter: Payer: Self-pay | Admitting: *Deleted

## 2019-06-13 ENCOUNTER — Telehealth: Payer: Self-pay | Admitting: Pulmonary Disease

## 2019-06-13 NOTE — Telephone Encounter (Signed)
FYI: Due to multiple unsuccessful attempts to contact pt to schedule lung cancer screening, this referral has been cancelled. .  

## 2021-11-21 ENCOUNTER — Other Ambulatory Visit: Payer: Self-pay | Admitting: Physician Assistant

## 2021-11-21 DIAGNOSIS — Z122 Encounter for screening for malignant neoplasm of respiratory organs: Secondary | ICD-10-CM

## 2021-11-21 DIAGNOSIS — Z1231 Encounter for screening mammogram for malignant neoplasm of breast: Secondary | ICD-10-CM

## 2021-11-27 ENCOUNTER — Other Ambulatory Visit: Payer: Self-pay | Admitting: Physician Assistant

## 2021-11-27 DIAGNOSIS — Z122 Encounter for screening for malignant neoplasm of respiratory organs: Secondary | ICD-10-CM

## 2021-11-27 DIAGNOSIS — F17200 Nicotine dependence, unspecified, uncomplicated: Secondary | ICD-10-CM

## 2021-12-01 ENCOUNTER — Encounter: Payer: Self-pay | Admitting: *Deleted

## 2021-12-02 ENCOUNTER — Other Ambulatory Visit: Payer: Self-pay | Admitting: Physician Assistant

## 2021-12-02 DIAGNOSIS — Z1231 Encounter for screening mammogram for malignant neoplasm of breast: Secondary | ICD-10-CM

## 2021-12-05 ENCOUNTER — Ambulatory Visit
Admission: RE | Admit: 2021-12-05 | Discharge: 2021-12-05 | Disposition: A | Discharge status: Home / Self Care | Payer: Medicare Other | Source: Ambulatory Visit | Attending: Physician Assistant | Admitting: Physician Assistant

## 2021-12-05 DIAGNOSIS — Z1231 Encounter for screening mammogram for malignant neoplasm of breast: Secondary | ICD-10-CM

## 2021-12-08 ENCOUNTER — Other Ambulatory Visit: Payer: Self-pay | Admitting: Physician Assistant

## 2021-12-08 DIAGNOSIS — R928 Other abnormal and inconclusive findings on diagnostic imaging of breast: Secondary | ICD-10-CM

## 2021-12-12 ENCOUNTER — Inpatient Hospital Stay: Admission: RE | Admit: 2021-12-12 | Payer: Medicare Other | Source: Ambulatory Visit

## 2021-12-26 ENCOUNTER — Ambulatory Visit: Payer: Medicare Other

## 2021-12-26 ENCOUNTER — Ambulatory Visit
Admission: RE | Admit: 2021-12-26 | Discharge: 2021-12-26 | Disposition: A | Discharge status: Home / Self Care | Payer: Medicare Other | Source: Ambulatory Visit | Attending: Physician Assistant | Admitting: Physician Assistant

## 2021-12-26 ENCOUNTER — Other Ambulatory Visit: Payer: Self-pay

## 2021-12-26 DIAGNOSIS — R928 Other abnormal and inconclusive findings on diagnostic imaging of breast: Secondary | ICD-10-CM

## 2022-01-02 ENCOUNTER — Ambulatory Visit
Admission: RE | Admit: 2022-01-02 | Discharge: 2022-01-02 | Disposition: A | Discharge status: Home / Self Care | Payer: Medicare Other | Source: Ambulatory Visit | Attending: Physician Assistant | Admitting: Physician Assistant

## 2022-01-02 DIAGNOSIS — Z122 Encounter for screening for malignant neoplasm of respiratory organs: Secondary | ICD-10-CM

## 2022-01-30 ENCOUNTER — Ambulatory Visit (INDEPENDENT_AMBULATORY_CARE_PROVIDER_SITE_OTHER): Payer: Self-pay | Admitting: *Deleted

## 2022-01-30 VITALS — Ht 60.0 in | Wt 102.0 lb

## 2022-01-30 DIAGNOSIS — Z1211 Encounter for screening for malignant neoplasm of colon: Secondary | ICD-10-CM

## 2022-01-30 NOTE — Progress Notes (Signed)
Gastroenterology Pre-Procedure Review ? ?Request Date: 01/30/2022 ?Requesting Physician: Donnelly Stager, PA-C, Last TCS 03/05/2006 by Dr. Melvia Heaps, Internal hemorrhoids, diverticulosis ? ?PATIENT REVIEW QUESTIONS: The patient responded to the following health history questions as indicated:   ? ?1. Diabetes Melitis: no ?2. Joint replacements in the past 12 months: no ?3. Major health problems in the past 3 months: yes, emphysema ?4. Has an artificial valve or MVP: no ?5. Has a defibrillator: no ?6. Has been advised in past to take antibiotics in advance of a procedure like teeth cleaning: no ?7. Family history of colon cancer: no  ?8. Alcohol Use: no ?9. Illicit drug Use: no ?10. History of sleep apnea: no  ?11. History of coronary artery or other vascular stents placed within the last 12 months: no ?12. History of any prior anesthesia complications: no ?13. Body mass index is 19.92 kg/m?. ?   ?MEDICATIONS & ALLERGIES:    ?Patient reports the following regarding taking any blood thinners:   ?Plavix? no ?Aspirin? no ?Coumadin? no ?Brilinta? no ?Xarelto? no ?Eliquis? no ?Pradaxa? no ?Savaysa? no ?Effient? no ? ?Patient confirms/reports the following medications:  ?Current Outpatient Medications  ?Medication Sig Dispense Refill  ? albuterol (PROVENTIL HFA;VENTOLIN HFA) 108 (90 Base) MCG/ACT inhaler Inhale 2 puffs into the lungs every 6 (six) hours as needed for wheezing or shortness of breath. 1 Inhaler 0  ? gabapentin (NEURONTIN) 300 MG capsule Take 2 capsules (600 mg total) by mouth 2 (two) times daily. For agitation (Patient taking differently: Take 300 mg by mouth daily at 6 (six) AM. For agitation) 120 capsule 0  ? ipratropium (ATROVENT HFA) 17 MCG/ACT inhaler Inhale 2 puffs into the lungs 4 (four) times daily. For asthma 1 Inhaler 0  ? lidocaine (LIDODERM) 5 % Place 1 patch onto the skin daily. Remove & Discard patch within 12 hours or as directed by MD: For pain (Patient taking differently: Place  1 patch onto the skin as needed. Remove & Discard patch within 12 hours or as directed by MD: For pain) 30 patch 0  ? meloxicam (MOBIC) 15 MG tablet Take by mouth daily at 6 (six) AM.    ? pramipexole (MIRAPEX) 1 MG tablet every evening.    ? promethazine (PHENERGAN) 25 MG tablet Take by mouth as needed.    ? traMADol (ULTRAM) 50 MG tablet Take by mouth as needed.    ? ?No current facility-administered medications for this visit.  ? ? ?Patient confirms/reports the following allergies:  ?No Known Allergies ? ?No orders of the defined types were placed in this encounter. ? ? ?AUTHORIZATION INFORMATION ?Primary Insurance: U[employer] Medicare,  ID #: [id number],  Group #: [id number] ?Pre-Cert / Berkley Harvey required:  ?Pre-Cert / Auth #:  ? ?Secondary Insurance: Medicaid [place] Access,  [place] #: [id number] ?Pre-Cert / Berkley Harvey required: No, not required ? ?SCHEDULE INFORMATION: ?Procedure has been scheduled as follows:  ?Date: , Time:   ?Location:  ? ?This Gastroenterology Pre-Precedure Review Form is being routed to the following provider(s): Brooke Bonito, NP ?  ?

## 2022-01-30 NOTE — Progress Notes (Signed)
COPD/emphysema, automatic ASA 3. Needs office visit prior to scheduling TCS.  ?

## 2022-02-02 ENCOUNTER — Ambulatory Visit: Payer: Medicare Other

## 2022-02-02 NOTE — Progress Notes (Signed)
Spoke to pt.  She is aware ov needed to arrange colonoscopy.  Scheduled virtual visit for 02/04/2022 at 1:00 with Tana Coast, PA-C. ?

## 2022-02-04 ENCOUNTER — Encounter: Payer: Self-pay | Admitting: Gastroenterology

## 2022-02-04 ENCOUNTER — Telehealth: Payer: Self-pay | Admitting: *Deleted

## 2022-02-04 ENCOUNTER — Telehealth: Payer: Self-pay | Admitting: Gastroenterology

## 2022-02-04 ENCOUNTER — Ambulatory Visit (INDEPENDENT_AMBULATORY_CARE_PROVIDER_SITE_OTHER): Payer: Medicare Other | Admitting: Gastroenterology

## 2022-02-04 VITALS — BP 158/82 | HR 80 | Temp 97.7°F | Ht 60.0 in | Wt 95.2 lb

## 2022-02-04 DIAGNOSIS — K838 Other specified diseases of biliary tract: Secondary | ICD-10-CM

## 2022-02-04 DIAGNOSIS — R110 Nausea: Secondary | ICD-10-CM

## 2022-02-04 DIAGNOSIS — R634 Abnormal weight loss: Secondary | ICD-10-CM

## 2022-02-04 DIAGNOSIS — Z1211 Encounter for screening for malignant neoplasm of colon: Secondary | ICD-10-CM

## 2022-02-04 NOTE — Telephone Encounter (Signed)
Pt consented to a virtual visit. 

## 2022-02-04 NOTE — Telephone Encounter (Signed)
Can we offer in person office visit today, can keep at 1pm or do 1:30pm? She is a new patient requiring an office visit to schedule colonoscopy due to COPD. It would be best to have a lung exam in person if she is able. If not, she can keep video visit but video will be required as she is a new patient.  ?

## 2022-02-04 NOTE — Telephone Encounter (Signed)
Doris Higgins, you are scheduled for a virtual visit with your provider today.  Just as we do with appointments in the office, we must obtain your consent to participate.  Your consent will be active for this visit and any virtual visit you may have with one of our providers in the next 365 days.  If you have a MyChart account, I can also send a copy of this consent to you electronically.  All virtual visits are billed to your insurance company just like a traditional visit in the office.  As this is a virtual visit, video technology does not allow for your provider to perform a traditional examination.  This may limit your provider's ability to fully assess your condition.  If your provider identifies any concerns that need to be evaluated in person or the need to arrange testing such as labs, EKG, etc, we will make arrangements to do so.  Although advances in technology are sophisticated, we cannot ensure that it will always work on either your end or our end.  If the connection with a video visit is poor, we may have to switch to a telephone visit.  With either a video or telephone visit, we are not always able to ensure that we have a secure connection.   I need to obtain your verbal consent now.   Are you willing to proceed with your visit today?  ?

## 2022-02-04 NOTE — Progress Notes (Signed)
? ? ? ?GI Office Note   ? ?Referring Provider: Roger KillWilliams, Breejante J, * ?Primary Care Physician:  Roger KillWilliams, Breejante J, PA-C  ?Primary Gastroenterologist: Hennie Duosharles K. Marletta Lorarver, DO ? ? ?Chief Complaint  ? ?Chief Complaint  ?Patient presents with  ? Colonoscopy  ?  Chronic nausea, loss of weight due to lack of appetite.   ? ? ? ?History of Present Illness  ? ?Doris Higgins is a 66 y.o. female presenting today at the request of [name], PA-C for colonoscopy/EGD ? ?Patient states she has had chronic nausea since 2005. Appetite is poor. Tries to eat sufficient enough to maintain weight. Her baseline weight is 105-115 pounds. Today her weight is 95 pounds. One month ago at home she weighed 101 pounds.  Two years ago she weighed 118 pounds. She wonders if stress is cause of her nausea. She feels hungry at times but then gets stressed and can't eat due to nausea. She can only eat foods that appeal to her, can't just grab anything. Denies postprandial abdominal pain. No heartburn. Sometimes has abdominal pain with constipation. [facility] from [facility] but then quit. Used to have terrible epigastric pain while on ASA powders. Currently on Celebrex.  Denies chronic dysphagia. Recently states she couldn't swallow during one meal but this has not recurred. BM daily if she is able to eat regularly otherwise can go 1-2 weeks without a BM. If she gets severe constipation, she will vomit while trying to defecate and has passed out but this has not happened in a number of years. Currently not on a bowel regimen. Usually will have a BM if she eats a food that often causes her diarrhea. No melena, brbpr. Last colonoscopy 10 years ago. During an EGD, she "gagged the whole time".  ?  ? ?February 2023: Fecal occult negative, white blood cell count 5500, hemoglobin 12.6, MCV 91.2, platelets 221,000. ?May 2022: Sodium 141, potassium 3.5, creatinine 0.72, albumin 3.9, total bilirubin 0.4, alkaline  phosphatase 92, AST 7, ALT 24. ?August 2020: H. pylori antibody positive ? ?CT CT chest lung cancer screening March 2023: ?IMPRESSION: ?1. Lung-RADS 2S, benign appearance or behavior. Continue annual ?screening with low-dose chest CT without contrast in 12 months. S ?modifier for mild dilation of the common bile duct. ?2. Mildly dilated common bile duct, measuring up to 9 mm. Correlate ?with liver function tests. If LFTs are abnormal, recommend MRCP for ?further evaluation. ?3. Aortic Atherosclerosis (ICD10-I70.0) and Emphysema (ICD10-J43.9). ? ?CT head without contrast May 2022: ? ?IMPRESSION:  ?No acute intracranial abnormality.   ? ?Medications  ? ?Current Outpatient Medications  ?Medication Sig Dispense Refill  ? albuterol (PROVENTIL HFA;VENTOLIN HFA) 108 (90 Base) MCG/ACT inhaler Inhale 2 puffs into the lungs every 6 (six) hours as needed for wheezing or shortness of breath. 1 Inhaler 0  ? budesonide-formoterol (SYMBICORT) 160-4.5 MCG/ACT inhaler TAKE 2 PUFFS BY MOUTH TWICE A DAY    ? celecoxib (CELEBREX) 200 MG capsule Take 1 tablet by mouth daily.    ? gabapentin (NEURONTIN) 300 MG capsule Take 2 capsules (600 mg total) by mouth 2 (two) times daily. For agitation (Patient taking differently: Take 300 mg by mouth daily at 6 (six) AM. For agitation) 120 capsule 0  ? ipratropium (ATROVENT HFA) 17 MCG/ACT inhaler Inhale 2 puffs into the lungs 4 (four) times daily. For asthma 1 Inhaler 0  ? lidocaine (LIDODERM) 5 % Place 1 patch onto the skin daily. Remove & Discard patch within 12 hours  or as directed by MD: For pain (Patient taking differently: Place 1 patch onto the skin as needed. Remove & Discard patch within 12 hours or as directed by MD: For pain) 30 patch 0  ? pramipexole (MIRAPEX) 1 MG tablet every evening.    ? promethazine (PHENERGAN) 25 MG tablet Take by mouth as needed.    ? traMADol (ULTRAM) 50 MG tablet Take by mouth as needed.    ? ?No current facility-administered medications for this visit.   ? ? ?Allergies  ? ?Allergies as of 02/04/2022  ? (No Known Allergies)  ? ? ?Past Medical History  ? ?Past Medical History:  ?Diagnosis Date  ? Anxiety   ? Arthritis   ? "hands, arms, fingers, wrists, elbows, shoulders, all down my spine" (07/29/2017)  ? Chronic back pain   ? COPD (chronic obstructive pulmonary disease) (HCC)   ? [name] 07/28/2017  ? Depression   ? Glaucoma, both eyes   ? 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 "since 2010; last one was 2016; never did find out why" (07/29/2017)  ? Headache   ? "a few times/week" (07/28/2017)  ? History of stomach ulcers 1978  ? Insomnia   ? Menopause   ? Migraine   ? "maybe 3-4 q 6 months" (07/28/2017)  ? Pneumonia   ? "when I was a kid" (07/29/2017)  ? Restless legs   ? Seizure (HCC)   ? Suicide and self-inflicted injury [facility])   ? ? ?Past Surgical History  ? ?Past Surgical History:  ?Procedure Laterality Date  ? ABDOMINAL HYSTERECTOMY  2000  ? BILATERAL OOPHORECTOMY Bilateral 2007  ? for mass which was found to be non -malignant per patient report  ? CATARACT EXTRACTION W/PHACO Left 03/12/2014  ? Procedure: CATARACT EXTRACTION PHACO AND INTRAOCULAR LENS PLACEMENT (IOC);  Surgeon: Gemma Payor, MD;  Location: [facility];  Service: Ophthalmology;  Laterality: Left;  CDE:7.05  ? CATARACT EXTRACTION W/PHACO Right 07/23/2014  ? Procedure: CATARACT EXTRACTION PHACO AND INTRAOCULAR LENS PLACEMENT (IOC); CDE 0.37;  Surgeon: Susa Simmonds, MD;  Location: [facility];  Service: Ophthalmology;  Laterality: Right;  CDE:  0.37  ? TUBAL LIGATION  1984  ? ? ?Past Family History  ? ?Family History  ?Problem Relation Age of Onset  ? Alcohol abuse Mother   ? Depression Mother   ? Alcohol abuse Father   ? Cancer Father   ?     lung ca  ? Bladder Cancer Sister   ? Cancer Maternal Aunt   ?     breast  ? Cancer Paternal Aunt   ?     breast  ? Bladder Cancer Paternal Uncle   ? Colon cancer Neg Hx   ? ? ?Past Social History  ? ?Social History  ? ?Socioeconomic History  ? Marital status: Widowed  ?  Spouse  name: Not on file  ? Number of children: Not on file  ? Years of education: Not on file  ? Highest education level: Not on file  ?Occupational History  ? Not on file  ?Tobacco Use  ? Smoking status: Former  ?  Packs/day: 1.00  ?  Years: 40.00  ?  Pack years: 40.00  ?  Types: Cigarettes  ?  Quit date: 01/27/2015  ?  Years since quitting: 7.0  ? Smokeless tobacco: Never  ?Vaping Use  ? Vaping Use: Former  ?Substance and Sexual Activity  ? Alcohol use: No  ? Drug use: No  ? Sexual activity: Not  Currently  ?  Birth control/protection: None, Surgical  ?Other Topics Concern  ? Not on file  ?Social History Narrative  ? Not on file  ? ?Social Determinants of Health  ? ?Financial Resource Strain: Not on file  ?Food Insecurity: Not on file  ?Transportation Needs: Not on file  ?Physical Activity: Not on file  ?Stress: Not on file  ?Social Connections: Not on file  ?Intimate Partner Violence: Not on file  ? ? ?Review of Systems  ? ?General: Negative for fever, chills, fatigue, weakness. See hpi ?Eyes: Negative for vision changes.  ?ENT: Negative for hoarseness, nasal congestion. See hpi ?CV: Negative for chest pain, angina, palpitations, +dyspnea on exertion, peripheral edema.  ?Respiratory: Negative for dyspnea at rest, +dyspnea on exertion, cough, sputum, wheezing.  ?GI: See history of present illness. ?GU:  Negative for dysuria, hematuria, urinary incontinence, urinary frequency, nocturnal urination.  ?MS: Negative for joint pain, low back pain.  ?Derm: Negative for rash or itching.  ?Neuro: Negative for weakness, abnormal sensation, seizure, frequent headaches, memory loss,  ?confusion.  ?Psych: Negative for anxiety, depression, suicidal ideation, hallucinations.  ?Endo: Negative for unusual weight change. See hpi ?Heme: Negative for bruising or bleeding. ?Allergy: Negative for rash or hives. ? ?Physical Exam  ? ?BP (!) 158/82 (BP Location: Right Arm, Patient Position: Sitting, Cuff Size: Normal)   Pulse 80   Temp 97.7 ?F  (36.5 ?C) (Temporal)   Ht 5' (1.524 m)   Wt 95 lb 3.2 oz (43.2 kg)   SpO2 97%   BMI 18.59 kg/m?  ?  ?General: Thin female, well-developed in no acute distress.  ?Head: Normocephalic, atraumatic.   ?Eyes: Conjunctiva p

## 2022-02-04 NOTE — Patient Instructions (Addendum)
Colonoscopy and upper endoscopy to be scheduled. See separate instructions.  ?Please have labs done so that we can evaluate your dilated bile duct and weight loss.  ? ?

## 2022-02-11 ENCOUNTER — Telehealth: Payer: Self-pay | Admitting: *Deleted

## 2022-02-11 ENCOUNTER — Encounter: Payer: Self-pay | Admitting: *Deleted

## 2022-02-11 MED ORDER — PEG 3350-KCL-NA BICARB-NACL 420 G PO SOLR: ORAL | 0 refills | Status: DC

## 2022-02-11 NOTE — Telephone Encounter (Signed)
Called pt. She has been scheduled for TCS/EGD with propofol asa 3/4 with Dr. Marletta Lor on 6/2 at 1:15pm. Aware will mail prep instructions with pre-op appt. Confirmed she does have linzess sample at home. ? ? ?PA approved via [facility]. Auth# [id number], DOS: Mar 06, 2022 - Jun 04, 2022 ?

## 2022-02-26 LAB — COMPREHENSIVE METABOLIC PANEL
ALT: 16 IU/L (ref 0–32)
AST: 20 IU/L (ref 0–40)
Albumin/Globulin Ratio: 2.1 (ref 1.2–2.2)
Albumin: 4.2 g/dL (ref 3.8–4.8)
Alkaline Phosphatase: 116 IU/L (ref 44–121)
BUN/Creatinine Ratio: 15 (ref 12–28)
BUN: 9 mg/dL (ref 8–27)
Bilirubin Total: <0.2 mg/dL (ref 0.0–1.2)
CO2: 26 mmol/L (ref 20–29)
Calcium: 9 mg/dL (ref 8.7–10.3)
Chloride: 97 mmol/L (ref 96–106)
Creatinine, Ser: 0.61 mg/dL (ref 0.57–1.00)
Globulin, Total: 2 g/dL (ref 1.5–4.5)
Glucose: 77 mg/dL (ref 70–99)
Potassium: 3.6 mmol/L (ref 3.5–5.2)
Sodium: 137 mmol/L (ref 134–144)
Total Protein: 6.2 g/dL (ref 6.0–8.5)
eGFR: 100 mL/min/{1.73_m2} (ref >59)

## 2022-02-26 LAB — TSH+FREE T4
Free T4: 1.13 ng/dL (ref 0.82–1.77)
TSH: 9.61 u[IU]/mL — ABNORMAL HIGH (ref 0.450–4.500)

## 2022-03-03 NOTE — Patient Instructions (Signed)
Doris Higgins [name]  03/03/2022     @PREFPERIOPPHARMACY @   Your procedure is scheduled on  03/06/2022.   Report to Doris Higgins at  1115  A.M.   Call this number if you have problems the morning of surgery:  [phone]   Remember:  Follow the diet and prep instructions given to you by the office.    Take these medicines the morning of surgery with A SIP OF WATER           flexeril(if needed), gabapentin, celebrex or tramadol (if needed).    Do not wear jewelry, make-up or nail polish.  Do not wear lotions, powders, or perfumes, or deodorant.  Do not shave 48 hours prior to surgery.  Men may shave face and neck.  Do not bring valuables to the hospital.  [name] is not responsible for any belongings or valuables.  Contacts, dentures or bridgework may not be worn into surgery.  Leave your suitcase in the car.  After surgery it may be brought to your room.  For patients admitted to the hospital, discharge time will be determined by your treatment team.  Patients discharged the day of surgery will not be allowed to drive home and must have someone with them for 24 hours.    Special instructions:   DO NOT smoke tobacco or vape for 24 hours before your procedure.  Please read over the following fact sheets that you were given. Anesthesia Post-op Instructions and Care and Recovery After Surgery      Upper Endoscopy, Adult, Care After This sheet gives you information about how to care for yourself after your procedure. Your health care provider may also give you more specific instructions. If you have problems or questions, contact your health care provider. What can I expect after the procedure? After the procedure, it is common to have: A sore throat. Mild stomach pain or discomfort. Bloating. Nausea. Follow these instructions at home:  Follow instructions from your health care provider about what to eat or drink after your procedure. Return to your normal  activities as told by your health care provider. Ask your health care provider what activities are safe for you. Take over-the-counter and prescription medicines only as told by your health care provider. If you were given a sedative during the procedure, it can affect you for several hours. Do not drive or operate machinery until your health care provider says that it is safe. Keep all follow-up visits as told by your health care provider. This is important. Contact a health care provider if you have: A sore throat that lasts longer than one day. Trouble swallowing. Get help right away if: You vomit blood or your vomit looks like coffee grounds. You have: A fever. Bloody, black, or tarry stools. A severe sore throat or you cannot swallow. Difficulty breathing. Severe pain in your chest or abdomen. Summary After the procedure, it is common to have a sore throat, mild stomach discomfort, bloating, and nausea. If you were given a sedative during the procedure, it can affect you for several hours. Do not drive or operate machinery until your health care provider says that it is safe. Follow instructions from your health care provider about what to eat or drink after your procedure. Return to your normal activities as told by your health care provider. This information is not intended to replace advice given to you by your health care provider. Make sure you discuss any  questions you have with your health care provider. Document Revised: 07/28/2019 Document Reviewed: 02/21/2018 Elsevier Patient Education  [name]. Colonoscopy, Adult, Care After The following information offers guidance on how to care for yourself after your procedure. Your health care provider may also give you more specific instructions. If you have problems or questions, contact your health care provider. What can I expect after the procedure? After the procedure, it is common to have: A small amount of blood in  your stool for 24 hours after the procedure. Some gas. Mild cramping or bloating of your abdomen. Follow these instructions at home: Eating and drinking  Drink enough fluid to keep your urine pale yellow. Follow instructions from your health care provider about eating or drinking restrictions. Resume your normal diet as told by your health care provider. Avoid heavy or fried foods that are hard to digest. Activity Rest as told by your health care provider. Avoid sitting for a long time without moving. Get up to take short walks every 1-2 hours. This is important to improve blood flow and breathing. Ask for help if you feel weak or unsteady. Return to your normal activities as told by your health care provider. Ask your health care provider what activities are safe for you. Managing cramping and bloating  Try walking around when you have cramps or feel bloated. If directed, apply heat to your abdomen as told by your health care provider. Use the heat source that your health care provider recommends, such as a moist heat pack or a heating pad. Place a towel between your skin and the heat source. Leave the heat on for 20-30 minutes. Remove the heat if your skin turns bright red. This is especially important if you are unable to feel pain, heat, or cold. You have a greater risk of getting burned. General instructions If you were given a sedative during the procedure, it can affect you for several hours. Do not drive or operate machinery until your health care provider says that it is safe. For the first 24 hours after the procedure: Do not sign important documents. Do not drink alcohol. Do your regular daily activities at a slower pace than normal. Eat soft foods that are easy to digest. Take over-the-counter and prescription medicines only as told by your health care provider. Keep all follow-up visits. This is important. Contact a health care provider if: You have blood in your stool 2-3  days after the procedure. Get help right away if: You have more than a small spotting of blood in your stool. You have large blood clots in your stool. You have swelling of your abdomen. You have nausea or vomiting. You have a fever. You have increasing pain in your abdomen that is not relieved with medicine. These symptoms may be an emergency. Get help right away. Call 911. Do not wait to see if the symptoms will go away. Do not drive yourself to the hospital. Summary After the procedure, it is common to have a small amount of blood in your stool. You may also have mild cramping and bloating of your abdomen. If you were given a sedative during the procedure, it can affect you for several hours. Do not drive or operate machinery until your health care provider says that it is safe. Get help right away if you have a lot of blood in your stool, nausea or vomiting, a fever, or increased pain in your abdomen. This information is not intended to replace advice given  to you by your health care provider. Make sure you discuss any questions you have with your health care provider. Document Revised: 05/14/2021 Document Reviewed: 05/14/2021 Elsevier Patient Education  [place] After This sheet gives you information about how to care for yourself after your procedure. Your health care provider may also give you more specific instructions. If you have problems or questions, contact your health care provider. What can I expect after the procedure? After the procedure, it is common to have: Tiredness. Forgetfulness about what happened after the procedure. Impaired judgment for important decisions. Nausea or vomiting. Some difficulty with balance. Follow these instructions at home: For the time period you were told by your health care provider:     Rest as needed. Do not participate in activities where you could fall or become injured. Do not drive or use  machinery. Do not drink alcohol. Do not take sleeping pills or medicines that cause drowsiness. Do not make important decisions or sign legal documents. Do not take care of children on your own. Eating and drinking Follow the diet that is recommended by your health care provider. Drink enough fluid to keep your urine pale yellow. If you vomit: Drink water, juice, or soup when you can drink without vomiting. Make sure you have little or no nausea before eating solid foods. General instructions Have a responsible adult stay with you for the time you are told. It is important to have someone help care for you until you are awake and alert. Take over-the-counter and prescription medicines only as told by your health care provider. If you have sleep apnea, surgery and certain medicines can increase your risk for breathing problems. Follow instructions from your health care provider about wearing your sleep device: Anytime you are sleeping, including during daytime naps. While taking prescription pain medicines, sleeping medicines, or medicines that make you drowsy. Avoid smoking. Keep all follow-up visits as told by your health care provider. This is important. Contact a health care provider if: You keep feeling nauseous or you keep vomiting. You feel light-headed. You are still sleepy or having trouble with balance after 24 hours. You develop a rash. You have a fever. You have redness or swelling around the IV site. Get help right away if: You have trouble breathing. You have new-onset confusion at home. Summary For several hours after your procedure, you may feel tired. You may also be forgetful and have poor judgment. Have a responsible adult stay with you for the time you are told. It is important to have someone help care for you until you are awake and alert. Rest as told. Do not drive or operate machinery. Do not drink alcohol or take sleeping pills. Get help right away if you have  trouble breathing, or if you suddenly become confused. This information is not intended to replace advice given to you by your health care provider. Make sure you discuss any questions you have with your health care provider. Document Revised: 08/26/2021 Document Reviewed: 08/24/2019 Elsevier Patient Education  [place].

## 2022-03-04 ENCOUNTER — Telehealth: Payer: Self-pay

## 2022-03-04 ENCOUNTER — Encounter (HOSPITAL_COMMUNITY)
Admission: RE | Admit: 2022-03-04 | Discharge: 2022-03-04 | Disposition: A | Discharge status: Home / Self Care | Payer: Medicare Other | Source: Ambulatory Visit | Attending: Internal Medicine | Admitting: Internal Medicine

## 2022-03-04 NOTE — Telephone Encounter (Signed)
Pre-op appt 03/31/22. Appt letter mailed with new procedure instructions.

## 2022-03-04 NOTE — Telephone Encounter (Signed)
Pt called office and LMOVM requesting to reschedule TCS/EGD scheduled for 03/28/22 due to death in family.  Called pt, her husband passed away. TCS/EGD rescheduled to 04/03/22 at 2:30pm. Endo scheduler informed.  PA previously obtained: PA# [id number], valid 03/28/22-06/04/22.

## 2022-03-17 ENCOUNTER — Other Ambulatory Visit: Payer: Self-pay | Admitting: *Deleted

## 2022-03-17 DIAGNOSIS — K838 Other specified diseases of biliary tract: Secondary | ICD-10-CM

## 2022-03-26 NOTE — Patient Instructions (Signed)
[facility]  03/26/2022     @PREFPERIOPPHARMACY @   Your procedure is scheduled on  04/03/2022.   Report to [facility] at 1230 P.M.   Call this number if you have problems the morning of surgery:  [phone]   Remember:  Follow the diet and prep instructions given to you by the office.  Use your nebulizer and your inhalers before you come and bring your rescue inhaler with you.    Take these medicines the morning of surgery with A SIP OF WATER         celebrex, flexeril(if needed), neurontin, mirapex, ultram (if needed).     Do not wear jewelry, make-up or nail polish.  Do not wear lotions, powders, or perfumes, or deodorant.  Do not shave 48 hours prior to surgery.  Men may shave face and neck.  Do not bring valuables to the hospital.  [facility] is not responsible for any belongings or valuables.  Contacts, dentures or bridgework may not be worn into surgery.  Leave your suitcase in the car.  After surgery it may be brought to your room.  For patients admitted to the hospital, discharge time will be determined by your treatment team.  Patients discharged the day of surgery will not be allowed to drive home and must have someone with them for 24 hours.    Special instructions:   DO NOT smoke tobacco or vape for 24 hours before your procedure.  Please read over the following fact sheets that you were given. Anesthesia Post-op Instructions and Care and Recovery After Surgery      Upper Endoscopy, Adult, Care After This sheet gives you information about how to care for yourself after your procedure. Your health care provider may also give you more specific instructions. If you have problems or questions, contact your health care provider. What can I expect after the procedure? After the procedure, it is common to have: A sore throat. Mild stomach pain or discomfort. Bloating. Nausea. Follow these instructions at home:  Follow instructions from  your health care provider about what to eat or drink after your procedure. Return to your normal activities as told by your health care provider. Ask your health care provider what activities are safe for you. Take over-the-counter and prescription medicines only as told by your health care provider. If you were given a sedative during the procedure, it can affect you for several hours. Do not drive or operate machinery until your health care provider says that it is safe. Keep all follow-up visits as told by your health care provider. This is important. Contact a health care provider if you have: A sore throat that lasts longer than one day. Trouble swallowing. Get help right away if: You vomit blood or your vomit looks like coffee grounds. You have: A fever. Bloody, black, or tarry stools. A severe sore throat or you cannot swallow. Difficulty breathing. Severe pain in your chest or abdomen. Summary After the procedure, it is common to have a sore throat, mild stomach discomfort, bloating, and nausea. If you were given a sedative during the procedure, it can affect you for several hours. Do not drive or operate machinery until your health care provider says that it is safe. Follow instructions from your health care provider about what to eat or drink after your procedure. Return to your normal activities as told by your health care provider. This information is not intended to replace advice  given to you by your health care provider. Make sure you discuss any questions you have with your health care provider. Document Revised: 07/28/2019 Document Reviewed: 02/21/2018 Elsevier Patient Education  [place]. Colonoscopy, Adult, Care After The following information offers guidance on how to care for yourself after your procedure. Your health care provider may also give you more specific instructions. If you have problems or questions, contact your health care provider. What can I  expect after the procedure? After the procedure, it is common to have: A small amount of blood in your stool for 24 hours after the procedure. Some gas. Mild cramping or bloating of your abdomen. Follow these instructions at home: Eating and drinking  Drink enough fluid to keep your urine pale yellow. Follow instructions from your health care provider about eating or drinking restrictions. Resume your normal diet as told by your health care provider. Avoid heavy or fried foods that are hard to digest. Activity Rest as told by your health care provider. Avoid sitting for a long time without moving. Get up to take short walks every 1-2 hours. This is important to improve blood flow and breathing. Ask for help if you feel weak or unsteady. Return to your normal activities as told by your health care provider. Ask your health care provider what activities are safe for you. Managing cramping and bloating  Try walking around when you have cramps or feel bloated. If directed, apply heat to your abdomen as told by your health care provider. Use the heat source that your health care provider recommends, such as a moist heat pack or a heating pad. Place a towel between your skin and the heat source. Leave the heat on for 20-30 minutes. Remove the heat if your skin turns bright red. This is especially important if you are unable to feel pain, heat, or cold. You have a greater risk of getting burned. General instructions If you were given a sedative during the procedure, it can affect you for several hours. Do not drive or operate machinery until your health care provider says that it is safe. For the first 24 hours after the procedure: Do not sign important documents. Do not drink alcohol. Do your regular daily activities at a slower pace than normal. Eat soft foods that are easy to digest. Take over-the-counter and prescription medicines only as told by your health care provider. Keep all  follow-up visits. This is important. Contact a health care provider if: You have blood in your stool 2-3 days after the procedure. Get help right away if: You have more than a small spotting of blood in your stool. You have large blood clots in your stool. You have swelling of your abdomen. You have nausea or vomiting. You have a fever. You have increasing pain in your abdomen that is not relieved with medicine. These symptoms may be an emergency. Get help right away. Call 911. Do not wait to see if the symptoms will go away. Do not drive yourself to the hospital. Summary After the procedure, it is common to have a small amount of blood in your stool. You may also have mild cramping and bloating of your abdomen. If you were given a sedative during the procedure, it can affect you for several hours. Do not drive or operate machinery until your health care provider says that it is safe. Get help right away if you have a lot of blood in your stool, nausea or vomiting, a fever, or increased  pain in your abdomen. This information is not intended to replace advice given to you by your health care provider. Make sure you discuss any questions you have with your health care provider. Document Revised: 05/14/2021 Document Reviewed: 05/14/2021 Elsevier Patient Education  2023 [employer] Monitored Anesthesia Care, Care After This sheet gives you information about how to care for yourself after your procedure. Your health care provider may also give you more specific instructions. If you have problems or questions, contact your health care provider. What can I expect after the procedure? After the procedure, it is common to have: Tiredness. Forgetfulness about what happened after the procedure. Impaired judgment for important decisions. Nausea or vomiting. Some difficulty with balance. Follow these instructions at home: For the time period you were told by your health care provider:      Rest as needed. Do not participate in activities where you could fall or become injured. Do not drive or use machinery. Do not drink alcohol. Do not take sleeping pills or medicines that cause drowsiness. Do not make important decisions or sign legal documents. Do not take care of children on your own. Eating and drinking Follow the diet that is recommended by your health care provider. Drink enough fluid to keep your urine pale yellow. If you vomit: Drink water, juice, or soup when you can drink without vomiting. Make sure you have little or no nausea before eating solid foods. General instructions Have a responsible adult stay with you for the time you are told. It is important to have someone help care for you until you are awake and alert. Take over-the-counter and prescription medicines only as told by your health care provider. If you have sleep apnea, surgery and certain medicines can increase your risk for breathing problems. Follow instructions from your health care provider about wearing your sleep device: Anytime you are sleeping, including during daytime naps. While taking prescription pain medicines, sleeping medicines, or medicines that make you drowsy. Avoid smoking. Keep all follow-up visits as told by your health care provider. This is important. Contact a health care provider if: You keep feeling nauseous or you keep vomiting. You feel light-headed. You are still sleepy or having trouble with balance after 24 hours. You develop a rash. You have a fever. You have redness or swelling around the IV site. Get help right away if: You have trouble breathing. You have new-onset confusion at home. Summary For several hours after your procedure, you may feel tired. You may also be forgetful and have poor judgment. Have a responsible adult stay with you for the time you are told. It is important to have someone help care for you until you are awake and alert. Rest as  told. Do not drive or operate machinery. Do not drink alcohol or take sleeping pills. Get help right away if you have trouble breathing, or if you suddenly become confused. This information is not intended to replace advice given to you by your health care provider. Make sure you discuss any questions you have with your health care provider. Document Revised: 08/26/2021 Document Reviewed: 08/24/2019 Elsevier Patient Education  2023 [employer].

## 2022-03-31 ENCOUNTER — Other Ambulatory Visit: Payer: Self-pay | Admitting: Gastroenterology

## 2022-03-31 ENCOUNTER — Encounter (HOSPITAL_COMMUNITY)
Admission: RE | Admit: 2022-03-31 | Discharge: 2022-03-31 | Disposition: A | Discharge status: Home / Self Care | Payer: Medicare Other | Source: Ambulatory Visit | Attending: Internal Medicine | Admitting: Internal Medicine

## 2022-03-31 ENCOUNTER — Ambulatory Visit (HOSPITAL_COMMUNITY)
Admission: RE | Admit: 2022-03-31 | Discharge: 2022-03-31 | Disposition: A | Discharge status: Home / Self Care | Payer: Medicare Other | Source: Ambulatory Visit | Attending: Gastroenterology | Admitting: Gastroenterology

## 2022-03-31 ENCOUNTER — Telehealth: Payer: Self-pay | Admitting: Internal Medicine

## 2022-03-31 ENCOUNTER — Encounter (HOSPITAL_COMMUNITY): Payer: Self-pay

## 2022-03-31 VITALS — Ht 60.0 in | Wt 94.0 lb

## 2022-03-31 DIAGNOSIS — K838 Other specified diseases of biliary tract: Secondary | ICD-10-CM | POA: Insufficient documentation

## 2022-03-31 DIAGNOSIS — R000 Tachycardia, unspecified: Secondary | ICD-10-CM

## 2022-03-31 HISTORY — DX: Tachycardia, unspecified: R00.0

## 2022-03-31 HISTORY — DX: Unspecified atherosclerosis: I70.90

## 2022-03-31 MED ORDER — GADOBUTROL 1 MMOL/ML IV SOLN
5.0000 mL | Freq: Once | INTRAVENOUS | Status: AC | PRN
  Administered 2022-03-31: 5 mL via INTRAVENOUS

## 2022-03-31 NOTE — Telephone Encounter (Signed)
Pt is scheduled procedure on Friday with Dr Marletta Lor and her prep is too expensive. She said the pharmacy told her she would have to pay out of pocket and she said she has no money. Please call her at [phone]

## 2022-04-02 NOTE — Telephone Encounter (Signed)
No need for enema and Dulcolax

## 2022-04-03 ENCOUNTER — Encounter (HOSPITAL_COMMUNITY)
Admission: RE | Disposition: A | Discharge status: Home / Self Care | Payer: Self-pay | Source: Ambulatory Visit | Attending: Internal Medicine

## 2022-04-03 ENCOUNTER — Encounter (HOSPITAL_COMMUNITY): Payer: Self-pay

## 2022-04-03 ENCOUNTER — Ambulatory Visit (HOSPITAL_COMMUNITY): Payer: Medicare Other | Admitting: Certified Registered Nurse Anesthetist

## 2022-04-03 ENCOUNTER — Ambulatory Visit (HOSPITAL_BASED_OUTPATIENT_CLINIC_OR_DEPARTMENT_OTHER): Payer: Medicare Other | Admitting: Certified Registered Nurse Anesthetist

## 2022-04-03 ENCOUNTER — Ambulatory Visit (HOSPITAL_COMMUNITY)
Admission: RE | Admit: 2022-04-03 | Discharge: 2022-04-03 | Disposition: A | Discharge status: Home / Self Care | Payer: Medicare Other | Source: Ambulatory Visit | Attending: Internal Medicine | Admitting: Internal Medicine

## 2022-04-03 DIAGNOSIS — Z1211 Encounter for screening for malignant neoplasm of colon: Secondary | ICD-10-CM

## 2022-04-03 DIAGNOSIS — K449 Diaphragmatic hernia without obstruction or gangrene: Secondary | ICD-10-CM | POA: Insufficient documentation

## 2022-04-03 DIAGNOSIS — K2970 Gastritis, unspecified, without bleeding: Secondary | ICD-10-CM

## 2022-04-03 DIAGNOSIS — Z87891 Personal history of nicotine dependence: Secondary | ICD-10-CM | POA: Diagnosis not present

## 2022-04-03 DIAGNOSIS — R634 Abnormal weight loss: Secondary | ICD-10-CM | POA: Insufficient documentation

## 2022-04-03 DIAGNOSIS — J449 Chronic obstructive pulmonary disease, unspecified: Secondary | ICD-10-CM | POA: Diagnosis not present

## 2022-04-03 DIAGNOSIS — K649 Unspecified hemorrhoids: Secondary | ICD-10-CM

## 2022-04-03 DIAGNOSIS — G40409 Other generalized epilepsy and epileptic syndromes, not intractable, without status epilepticus: Secondary | ICD-10-CM | POA: Diagnosis not present

## 2022-04-03 DIAGNOSIS — K5730 Diverticulosis of large intestine without perforation or abscess without bleeding: Secondary | ICD-10-CM | POA: Insufficient documentation

## 2022-04-03 DIAGNOSIS — K2950 Unspecified chronic gastritis without bleeding: Secondary | ICD-10-CM | POA: Insufficient documentation

## 2022-04-03 DIAGNOSIS — K219 Gastro-esophageal reflux disease without esophagitis: Secondary | ICD-10-CM | POA: Diagnosis not present

## 2022-04-03 DIAGNOSIS — R112 Nausea with vomiting, unspecified: Secondary | ICD-10-CM | POA: Diagnosis not present

## 2022-04-03 DIAGNOSIS — R000 Tachycardia, unspecified: Secondary | ICD-10-CM

## 2022-04-03 HISTORY — PX: ESOPHAGOGASTRODUODENOSCOPY (EGD) WITH PROPOFOL: SHX5813

## 2022-04-03 HISTORY — PX: COLONOSCOPY WITH PROPOFOL: SHX5780

## 2022-04-03 HISTORY — PX: BIOPSY: SHX5522

## 2022-04-03 SURGERY — COLONOSCOPY WITH PROPOFOL
Anesthesia: General

## 2022-04-03 MED ORDER — EPHEDRINE 5 MG/ML INJ
INTRAVENOUS | Status: AC
  Filled 2022-04-03: qty 5

## 2022-04-03 MED ORDER — PHENYLEPHRINE 80 MCG/ML (10ML) SYRINGE FOR IV PUSH (FOR BLOOD PRESSURE SUPPORT)
PREFILLED_SYRINGE | INTRAVENOUS | Status: AC
  Filled 2022-04-03: qty 20

## 2022-04-03 MED ORDER — LIDOCAINE HCL (CARDIAC) PF 100 MG/5ML IV SOSY
PREFILLED_SYRINGE | INTRAVENOUS | Status: DC | PRN
  Administered 2022-04-03: 50 mg via INTRAVENOUS

## 2022-04-03 MED ORDER — LACTATED RINGERS IV SOLN: INTRAVENOUS | Status: DC | PRN

## 2022-04-03 MED ORDER — PROPOFOL 10 MG/ML IV BOLUS
INTRAVENOUS | Status: DC | PRN
  Administered 2022-04-03 (×2): 40 mg via INTRAVENOUS
  Administered 2022-04-03: 100 mg via INTRAVENOUS

## 2022-04-03 MED ORDER — PROPOFOL 500 MG/50ML IV EMUL
INTRAVENOUS | Status: DC | PRN
  Administered 2022-04-03: 200 ug/kg/min via INTRAVENOUS

## 2022-04-03 MED ORDER — PANTOPRAZOLE SODIUM 40 MG PO TBEC: 40.0000 mg | DELAYED_RELEASE_TABLET | Freq: Two times a day (BID) | ORAL | 11 refills | Status: AC

## 2022-04-03 NOTE — Anesthesia Preprocedure Evaluation (Signed)
Anesthesia Evaluation  Patient identified by MRN, date of birth, ID band Patient awake    Reviewed: Allergy & Precautions, H&P , NPO status , Patient's Chart, lab work & pertinent test results, reviewed documented beta blocker date and time   Airway Mallampati: II  TM Distance: >3 FB Neck ROM: full    Dental no notable dental hx.    Pulmonary COPD, former smoker,    Pulmonary exam normal breath sounds clear to auscultation       Cardiovascular Exercise Tolerance: Good negative cardio ROS   Rhythm:regular Rate:Normal     Neuro/Psych  Headaches, Seizures -,  PSYCHIATRIC DISORDERS Anxiety Depression  Neuromuscular disease    GI/Hepatic Neg liver ROS, GERD  Medicated,  Endo/Other  negative endocrine ROS  Renal/GU negative Renal ROS  negative genitourinary   Musculoskeletal   Abdominal   Peds  Hematology negative hematology ROS (+)   Anesthesia Other Findings   Reproductive/Obstetrics negative OB ROS                             Anesthesia Physical Anesthesia Plan  ASA: 3  Anesthesia Plan: General   Post-op Pain Management:    Induction:   PONV Risk Score and Plan: Propofol infusion  Airway Management Planned:   Additional Equipment:   Intra-op Plan:   Post-operative Plan:   Informed Consent: I have reviewed the patients History and Physical, chart, labs and discussed the procedure including the risks, benefits and alternatives for the proposed anesthesia with the patient or authorized representative who has indicated his/her understanding and acceptance.     Dental Advisory Given  Plan Discussed with: CRNA  Anesthesia Plan Comments:         Anesthesia Quick Evaluation

## 2022-04-03 NOTE — Op Note (Signed)
[facility] Patient Name: Doris Higgins Procedure Date: 04/03/2022 2:10 PM MRN: [id number] Date of Birth: Aug 14, 1956 Attending MD: Elon Alas. Edgar Frisk CSN: [id number] Age: 66 Admit Type: Outpatient Procedure:                Colonoscopy Indications:              Screening for colorectal malignant neoplasm Providers:                Elon Alas. Abbey Chatters, DO, Lurline Del, RN, Kristine L.                            Risa Grill, Technician, Everardo Pacific Referring MD:              Medicines:                See the Anesthesia note for documentation of the                            administered medications Complications:            No immediate complications. Estimated Blood Loss:     Estimated blood loss: none. Procedure:                Pre-Anesthesia Assessment:                           - The anesthesia plan was to use monitored                            anesthesia care (MAC).                           After obtaining informed consent, the colonoscope                            was passed under direct vision. Throughout the                            procedure, the patient's blood pressure, pulse, and                            oxygen saturations were monitored continuously. The                            PCF-HQ190L ([id number]) scope was introduced through                            the anus and advanced to the the cecum, identified                            by appendiceal orifice and ileocecal valve. The                            colonoscopy was performed without difficulty. The                            patient tolerated the  procedure well. The quality                            of the bowel preparation was evaluated using the                            BBPS [facility] Bowel Preparation Scale) with scores                            of: Right Colon = 2 (minor amount of residual                            staining, small fragments of stool and/or opaque                             liquid, but mucosa seen well), Transverse Colon = 2                            (minor amount of residual staining, small fragments                            of stool and/or opaque liquid, but mucosa seen                            well) and Left Colon = 2 (minor amount of residual                            staining, small fragments of stool and/or opaque                            liquid, but mucosa seen well). The total BBPS score                            equals 6. The quality of the bowel preparation was                            fair. Scope In: 2:13:52 PM Scope Out: 2:28:22 PM Scope Withdrawal Time: 0 hours 11 minutes 6 seconds  Total Procedure Duration: 0 hours 14 minutes 30 seconds  Findings:      Hemorrhoids were found on perianal exam.      Multiple medium-mouthed diverticula were found in the sigmoid colon.      A moderate amount of semi-liquid stool was found in the entire colon,       making visualization difficult. Lavage of the area was performed using       copious amounts of sterile water, resulting in clearance with fair       visualization. Impression:               - Preparation of the colon was fair.                           - Hemorrhoids found on perianal exam.                           -  Diverticulosis in the sigmoid colon.                           - Stool in the entire examined colon.                           - No specimens collected. Moderate Sedation:      Per Anesthesia Care Recommendation:           - Patient has a contact number available for                            emergencies. The signs and symptoms of potential                            delayed complications were discussed with the                            patient. Return to normal activities tomorrow.                            Written discharge instructions were provided to the                            patient.                           - Resume previous diet.                           -  Continue present medications.                           - Repeat colonoscopy in 5 years for screening                            purposes.                           - Return to GI clinic in 4 months. Procedure Code(s):        --- Professional ---                           E2683, Colorectal cancer screening; colonoscopy on                            individual not meeting criteria for high risk Diagnosis Code(s):        --- Professional ---                           Z12.11, Encounter for screening for malignant                            neoplasm of colon                           K64.9, Unspecified hemorrhoids  K57.30, Diverticulosis of large intestine without                            perforation or abscess without bleeding CPT copyright 2019 American Medical Association. All rights reserved. The codes documented in this report are preliminary and upon coder review may  be revised to meet current compliance requirements. Elon Alas. Abbey Chatters, DO [place] Abbey Chatters, DO 04/03/2022 2:33:31 PM This report has been signed electronically. Number of Addenda: 0

## 2022-04-03 NOTE — H&P (Signed)
Primary Care Physician:  Bernita Buffy Primary Gastroenterologist:  Dr. Marletta Lor  Pre-Procedure History & Physical: HPI:  Doris Higgins is a 66 y.o. female is here for an EGD for nausea, vomiting, weight loss, and a colonoscopy to be performed for colon cancer screening purposes.  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)    "since 2010; last one was 2016; never did find out why" (07/29/2017)   Headache    "a few times/week" (07/28/2017)   History of stomach ulcers 1978   Insomnia    Menopause    Migraine    "maybe 3-4 q 6 months" (07/28/2017)   Pneumonia    "when I was a kid" (07/29/2017)   Restless legs    Seizure (HCC)    Suicide and self-inflicted injury (HCC)    Tachycardia     Past Surgical History:  Procedure Laterality Date   ABDOMINAL HYSTERECTOMY  2000   BILATERAL OOPHORECTOMY Bilateral 2007   for mass which was found to be non -malignant per patient report   CATARACT EXTRACTION W/PHACO Left 03/12/2014   Procedure: CATARACT EXTRACTION PHACO AND INTRAOCULAR LENS PLACEMENT (IOC);  Surgeon: Gemma Payor, MD;  Location: [facility];  Service: Ophthalmology;  Laterality: Left;  CDE:7.05   CATARACT EXTRACTION W/PHACO Right 07/23/2014   Procedure: CATARACT EXTRACTION PHACO AND INTRAOCULAR LENS PLACEMENT (IOC); CDE 0.37;  Surgeon: Susa Simmonds, MD;  Location: [facility];  Service: Ophthalmology;  Laterality: Right;  CDE:  0.37   TUBAL LIGATION  1984    Prior to Admission medications   Medication Sig Start Date End Date Taking? Authorizing Provider  albuterol (ACCUNEB) 1.25 MG/3ML nebulizer solution Take 1 ampule by nebulization daily as needed for wheezing.   Yes [provider]  budesonide-formoterol (SYMBICORT) 160-4.5 MCG/ACT inhaler Inhale 2 puffs into the lungs 2 (two) times daily as needed (shortness of breath). 03/05/21  Yes [provider]  celecoxib (CELEBREX) 200 MG capsule Take 200 mg by mouth daily. 01/02/22  Yes [provider]  cyclobenzaprine (FLEXERIL) 10 MG tablet Take 10 mg by mouth daily as needed for muscle spasms. 01/16/22  Yes [provider]  gabapentin (NEURONTIN) 600 MG tablet Take 300 mg by mouth daily.   Yes [provider]  lidocaine (LIDODERM) 5 % Place 1 patch onto the skin daily. Remove & Discard patch within 12 hours or as directed by MD: For pain Patient taking differently: Place 0.5 patches onto the skin as needed (pain). 04/29/18  Yes Armandina Stammer I, NP  polyethylene glycol-electrolytes (NULYTELY) 420 g solution As directed 02/11/22  Yes Delania Ferg K, DO  pramipexole (MIRAPEX) 1 MG tablet Take 1 mg by mouth daily. 01/13/22  Yes [provider]  traMADol (ULTRAM) 50 MG tablet Take 50 mg by mouth 2 (two) times daily as needed for moderate pain.   Yes [provider]    Allergies as of 02/11/2022   (No Known Allergies)    Family History  Problem Relation Age of Onset   Alcohol abuse Mother    Depression Mother    Alcohol abuse Father    Cancer Father  lung ca   Bladder Cancer Sister    Cancer Maternal Aunt        breast   Cancer Paternal Aunt        breast   Bladder Cancer Paternal Uncle    Colon cancer Neg Hx     Social History   Socioeconomic History   Marital status: Widowed    Spouse name: Not on file   Number of children: Not on file   Years of education: Not on file   Highest education level: Not on file  Occupational History   Not on file  Tobacco Use   Smoking status: Former    Packs/day: 1.00    Years: 40.00    Total pack years: 40.00    Types: Cigarettes    Quit date: 01/27/2015    Years since quitting: 7.1   Smokeless tobacco:  Never  Vaping Use   Vaping Use: Former  Substance and Sexual Activity   Alcohol use: No   Drug use: No   Sexual activity: Not Currently    Birth control/protection: None, Surgical  Other Topics Concern   Not on file  Social History Narrative   Not on file   Social Determinants of Health   Financial Resource Strain: Not on file  Food Insecurity: Not on file  Transportation Needs: Not on file  Physical Activity: Not on file  Stress: Not on file  Social Connections: Not on file  Intimate Partner Violence: Not on file    Review of Systems: See HPI, otherwise negative ROS  Physical Exam: Vital signs in last 24 hours: Temp:  [98 F (36.7 C)] 98 F (36.7 C) (06/30 1328) Pulse Rate:  [93] 93 (06/30 1328) Resp:  [20] 20 (06/30 1328) BP: (144)/(74) 144/74 (06/30 1328) SpO2:  [96 %] 96 % (06/30 1328) Weight:  [42.6 kg] 42.6 kg (06/30 1328)   General:   Alert,  Well-developed, well-nourished, pleasant and cooperative in NAD Head:  Normocephalic and atraumatic. Eyes:  Sclera clear, no icterus.   Conjunctiva pink. Ears:  Normal auditory acuity. Nose:  No deformity, discharge,  or lesions. Mouth:  No deformity or lesions, dentition normal. Neck:  Supple; no masses or thyromegaly. Lungs:  Clear throughout to auscultation.   No wheezes, crackles, or rhonchi. No acute distress. Heart:  Regular rate and rhythm; no murmurs, clicks, rubs,  or gallops. Abdomen:  Soft, nontender and nondistended. No masses, hepatosplenomegaly or hernias noted. Normal bowel sounds, without guarding, and without rebound.   Msk:  Symmetrical without gross deformities. Normal posture. Extremities:  Without clubbing or edema. Neurologic:  Alert and  oriented x4;  grossly normal neurologically. Skin:  Intact without significant lesions or rashes. Cervical Nodes:  No significant cervical adenopathy. Psych:  Alert and cooperative. Normal mood and affect.  Impression/Plan: Doris Higgins is here for an EGD  for nausea, vomiting, weight loss, and a colonoscopy to be performed for colon cancer screening purposes.  The risks of the procedure including infection, bleed, or perforation as well as benefits, limitations, alternatives and imponderables have been reviewed with the patient. Questions have been answered. All parties agreeable.

## 2022-04-03 NOTE — Discharge Instructions (Signed)
EGD Discharge instructions Please read the instructions outlined below and refer to this sheet in the next few weeks. These discharge instructions provide you with general information on caring for yourself after you leave the hospital. Your doctor may also give you specific instructions. While your treatment has been planned according to the most current medical practices available, unavoidable complications occasionally occur. If you have any problems or questions after discharge, please call your doctor. ACTIVITY You may resume your regular activity but move at a slower pace for the next 24 hours.  Take frequent rest periods for the next 24 hours.  Walking will help expel (get rid of) the air and reduce the bloated feeling in your abdomen.  No driving for 24 hours (because of the anesthesia (medicine) used during the test).  You may shower.  Do not sign any important legal documents or operate any machinery for 24 hours (because of the anesthesia used during the test).  NUTRITION Drink plenty of fluids.  You may resume your normal diet.  Begin with a light meal and progress to your normal diet.  Avoid alcoholic beverages for 24 hours or as instructed by your caregiver.  MEDICATIONS You may resume your normal medications unless your caregiver tells you otherwise.  WHAT YOU CAN EXPECT TODAY You may experience abdominal discomfort such as a feeling of fullness or "gas" pains.  FOLLOW-UP Your doctor will discuss the results of your test with you.  SEEK IMMEDIATE MEDICAL ATTENTION IF ANY OF THE FOLLOWING OCCUR: Excessive nausea (feeling sick to your stomach) and/or vomiting.  Severe abdominal pain and distention (swelling).  Trouble swallowing.  Temperature over 101 F (37.8 C).  Rectal bleeding or vomiting of blood.     Colonoscopy Discharge Instructions  Read the instructions outlined below and refer to this sheet in the next few weeks. These discharge instructions provide you with  general information on caring for yourself after you leave the hospital. Your doctor may also give you specific instructions. While your treatment has been planned according to the most current medical practices available, unavoidable complications occasionally occur.   ACTIVITY You may resume your regular activity, but move at a slower pace for the next 24 hours.  Take frequent rest periods for the next 24 hours.  Walking will help get rid of the air and reduce the bloated feeling in your belly (abdomen).  No driving for 24 hours (because of the medicine (anesthesia) used during the test).   Do not sign any important legal documents or operate any machinery for 24 hours (because of the anesthesia used during the test).  NUTRITION Drink plenty of fluids.  You may resume your normal diet as instructed by your doctor.  Begin with a light meal and progress to your normal diet. Heavy or fried foods are harder to digest and may make you feel sick to your stomach (nauseated).  Avoid alcoholic beverages for 24 hours or as instructed.  MEDICATIONS You may resume your normal medications unless your doctor tells you otherwise.  WHAT YOU CAN EXPECT TODAY Some feelings of bloating in the abdomen.  Passage of more gas than usual.  Spotting of blood in your stool or on the toilet paper.  IF YOU HAD POLYPS REMOVED DURING THE COLONOSCOPY: No aspirin products for 7 days or as instructed.  No alcohol for 7 days or as instructed.  Eat a soft diet for the next 24 hours.  FINDING OUT THE RESULTS OF YOUR TEST Not all test results are  available during your visit. If your test results are not back during the visit, make an appointment with your caregiver to find out the results. Do not assume everything is normal if you have not heard from your caregiver or the medical facility. It is important for you to follow up on all of your test results.  SEEK IMMEDIATE MEDICAL ATTENTION IF: You have more than a spotting of  blood in your stool.  Your belly is swollen (abdominal distention).  You are nauseated or vomiting.  You have a temperature over 101.  You have abdominal pain or discomfort that is severe or gets worse throughout the day.   Your EGD revealed mild amount inflammation in your stomach.  I took biopsies of this to rule out infection with a bacteria called H. pylori.  Await pathology results, my office will contact you.  I am going to start you on a new medication called pantoprazole 40 mg twice daily for the next 12 weeks.  Avoid NSAIDs.  Your colonoscopy was relatively unremarkable.  I did not find any polyps or evidence of colon cancer.  I recommend repeating colonoscopy in 10 years for colon cancer screening purposes.  You do have diverticulosis and internal hemorrhoids. I would recommend increasing fiber in your diet or adding OTC Benefiber/Metamucil. Be sure to drink at least 4 to 6 glasses of water daily.   Follow-up with GI in 3-4 months  I hope you have a great rest of your week!  Doris Higgins. Marletta Lor, D.O. Gastroenterology and Hepatology [facility]

## 2022-04-03 NOTE — Transfer of Care (Signed)
Immediate Anesthesia Transfer of Care Note  Patient: Doris Higgins [facility]  Procedure(s) Performed: COLONOSCOPY WITH PROPOFOL ESOPHAGOGASTRODUODENOSCOPY (EGD) WITH PROPOFOL  Patient Location: Short Stay  Anesthesia Type:General  Level of Consciousness: drowsy  Airway & Oxygen Therapy: Patient Spontanous Breathing  Post-op Assessment: Report given to RN and Post -op Vital signs reviewed and stable  Post vital signs: Reviewed and stable  Last Vitals:  Vitals Value Taken Time  BP    Temp    Pulse    Resp    SpO2      Last Pain:  Vitals:   04/03/22 1328  TempSrc: Oral  PainSc: 5          Complications: No notable events documented.

## 2022-04-03 NOTE — Op Note (Signed)
[facility] Patient Name: Doris Higgins Procedure Date: 04/03/2022 1:54 PM MRN: [id number] Date of Birth: 1956-04-22 Attending MD: Elon Alas. Abbey Chatters DO CSN: [id number] Age: 66 Admit Type: Outpatient Procedure:                Upper GI endoscopy Indications:              Nausea with vomiting, Weight loss Providers:                Elon Alas. Abbey Chatters, DO, Lurline Del, RN, Kristine L.                            Risa Grill, Technician, Everardo Pacific Referring MD:              Medicines:                See the Anesthesia note for documentation of the                            administered medications Complications:            No immediate complications. Estimated Blood Loss:     Estimated blood loss was minimal. Procedure:                Pre-Anesthesia Assessment:                           - The anesthesia plan was to use monitored                            anesthesia care (MAC).                           After obtaining informed consent, the endoscope was                            passed under direct vision. Throughout the                            procedure, the patient's blood pressure, pulse, and                            oxygen saturations were monitored continuously. The                            GIF-H190 ([id number]) scope was introduced through the                            mouth, and advanced to the second part of duodenum.                            The upper GI endoscopy was accomplished without                            difficulty. The patient tolerated the procedure  well. Scope In: 2:04:14 PM Scope Out: 2:08:26 PM Total Procedure Duration: 0 hours 4 minutes 12 seconds  Findings:      A 2 cm hiatal hernia was present.      Diffuse moderate inflammation characterized by erythema and linear       erosions was found in the entire examined stomach. Biopsies were taken       with a cold forceps for Helicobacter pylori testing.      The  duodenal bulb, first portion of the duodenum and second portion of       the duodenum were normal. Impression:               - 2 cm hiatal hernia.                           - Gastritis. Biopsied.                           - Normal duodenal bulb, first portion of the                            duodenum and second portion of the duodenum. Moderate Sedation:      Per Anesthesia Care Recommendation:           - Patient has a contact number available for                            emergencies. The signs and symptoms of potential                            delayed complications were discussed with the                            patient. Return to normal activities tomorrow.                            Written discharge instructions were provided to the                            patient.                           - Resume previous diet.                           - Continue present medications.                           - Use a proton pump inhibitor PO BID for 12 weeks.                           - Return to GI clinic in 4 months. Procedure Code(s):        --- Professional ---                           [phone], Esophagogastroduodenoscopy, flexible,  transoral; with biopsy, single or multiple Diagnosis Code(s):        --- Professional ---                           K44.9, Diaphragmatic hernia without obstruction or                            gangrene                           K29.70, Gastritis, unspecified, without bleeding                           R11.2, Nausea with vomiting, unspecified                           R63.4, Abnormal weight loss CPT copyright 2019 American Medical Association. All rights reserved. The codes documented in this report are preliminary and upon coder review may  be revised to meet current compliance requirements. Elon Alas. Abbey Chatters, DO [place] Abbey Chatters, DO 04/03/2022 2:12:03 PM This report has been signed electronically. Number of Addenda: 0

## 2022-04-04 NOTE — Anesthesia Postprocedure Evaluation (Signed)
Anesthesia Post Note  Patient: Doris Higgins [facility]  Procedure(s) Performed: COLONOSCOPY WITH PROPOFOL ESOPHAGOGASTRODUODENOSCOPY (EGD) WITH PROPOFOL BIOPSY  Patient location during evaluation: Phase II Anesthesia Type: General Level of consciousness: awake Pain management: pain level controlled Vital Signs Assessment: post-procedure vital signs reviewed and stable Respiratory status: spontaneous breathing and respiratory function stable Cardiovascular status: blood pressure returned to baseline and stable Postop Assessment: no headache and no apparent nausea or vomiting Anesthetic complications: no Comments: Late entry   No notable events documented.   Last Vitals:  Vitals:   04/03/22 1328 04/03/22 1432  BP: (!) 144/74 (!) 97/48  Pulse: 93 80  Resp: 20 16  Temp: 36.7 C (!) 36 C  SpO2: 96% 95%    Last Pain:  Vitals:   04/03/22 1432  TempSrc: Axillary  PainSc: 0-No pain                 Doris Higgins

## 2022-04-14 LAB — SURGICAL PATHOLOGY

## 2022-04-15 ENCOUNTER — Encounter (HOSPITAL_COMMUNITY): Payer: Self-pay | Admitting: Internal Medicine

## 2022-04-21 ENCOUNTER — Institutional Professional Consult (permissible substitution): Payer: Medicare Other | Admitting: Pulmonary Disease

## 2022-05-06 ENCOUNTER — Ambulatory Visit (INDEPENDENT_AMBULATORY_CARE_PROVIDER_SITE_OTHER): Payer: Medicare Other | Admitting: Pulmonary Disease

## 2022-05-06 ENCOUNTER — Encounter: Payer: Self-pay | Admitting: Pulmonary Disease

## 2022-05-06 VITALS — BP 122/66 | HR 77 | Ht 60.0 in | Wt 97.8 lb

## 2022-05-06 DIAGNOSIS — J432 Centrilobular emphysema: Secondary | ICD-10-CM

## 2022-05-06 MED ORDER — BUDESONIDE-FORMOTEROL FUMARATE 160-4.5 MCG/ACT IN AERO: 2.0000 | INHALATION_SPRAY | Freq: Two times a day (BID) | RESPIRATORY_TRACT | 3 refills | Status: DC | PRN

## 2022-05-06 NOTE — Addendum Note (Signed)
Addended by: Phillips Grout on: 05/06/2022 02:46 PM   Modules accepted: Orders

## 2022-05-06 NOTE — Patient Instructions (Signed)
Nice to meet you  No changes in medications, use Symbicort 2 puff twice daily and Atrovent 4 times a day  Continue albuterol as needed  I ordered an overnight oximetry test to see if your oxygen is dropping at night, if so we will order oxygen  Overt pulmonary function test to further understand how well your lungs are working and if there is anything else we need to be treating differently  Return to clinic in 3 months with pulmonary function test same day and follow-up with Doris Higgins to discuss results after

## 2022-05-06 NOTE — Progress Notes (Signed)
@Patient  ID: , female    DOB: [birth year], 66 y.o.   MRN: [id number]  Chief Complaint  Patient presents with   Consult    Pt is a consult for copd. Pt states she has had copd since 2018. Pt states that she is on Atrovent daily and a nebulized medication.     Referring provider: 2019, *  HPI:   66 y.o. woman whom are seen in consultation for evaluation of emphysema.  Note from referring provider reviewed x3.  Discharge summary in 2019 for hypoxemic respiratory failure reviewed.  Patient with ongoing history of shortness of breath, recurrent bronchitis.  Also needs prednisone and antibiotics.  All markers cough and worsening dyspnea on exertion.  Clears up with antibiotics.  Has really improved in terms of frequency with good adherence to Symbicort 2 puffs twice daily as well as Atrovent 4 times daily.  Less frequent.  Overall breathing a bit better.  She notices a difference when she misses doses.  She is unsure and does not think she is ever performed pulmonary function test.  She was discharged to the hospital in 2019 after hypoxemic respiratory failure that was acute.  She was weaned off oxygen at time of discharge.  It is felt to be due to COPD exacerbation.  Chest images were clear at that time.  Most recent chest images CT lung cancer screening 12/2021 personally reviewed and interpreted as mild emphysema in the upper lobes, right middle lobe, lingula and bilateral lower lobe bronchiectasis is mild with associated bronchial thickening throughout, scattered endobronchial nodules consistent with mucous plugging as well as hyperinflation noted on my interpretation.  PMH: Tobacco abuse in remission, emphysema, GERD Surgical history: Hysterectomy, oophorectomy, tubal ligation Family history: Mother with depression, father with lung cancer Social history: Former smoker, quit in 2016, lives in [place] / Pulmonary Flowsheets:   ACT:      No  data to display          MMRC:     No data to display          Epworth:      No data to display          Tests:   FENO:  No results found for: "NITRICOXIDE"  PFT:     No data to display          WALK:      No data to display          Imaging: Personally reviewed and as per EMR discussion this note No results found.  Lab Results: Personally reviewed CBC    Component Value Date/Time   WBC 6.9 12/01/2018 1221   RBC 4.13 12/01/2018 1221   HGB 11.7 (L) 12/01/2018 1221   HCT 35.4 (L) 12/01/2018 1221   PLT 309.0 12/01/2018 1221   MCV 85.6 12/01/2018 1221   MCH 28.1 04/25/2018 0553   MCHC 33.2 12/01/2018 1221   RDW 14.3 12/01/2018 1221   LYMPHSABS 2.0 12/01/2018 1221   MONOABS 0.5 12/01/2018 1221   EOSABS 0.2 12/01/2018 1221   BASOSABS 0.1 12/01/2018 1221    BMET    Component Value Date/Time   NA 137 02/25/2022 0909   K 3.6 02/25/2022 0909   CL 97 02/25/2022 0909   CO2 26 02/25/2022 0909   GLUCOSE 77 02/25/2022 0909   GLUCOSE 94 04/25/2018 0553   BUN 9 02/25/2022 0909   CREATININE 0.61 02/25/2022 0909   CALCIUM 9.0 02/25/2022 0909  GFRNONAA >60 04/25/2018 0553   GFRAA >60 04/25/2018 0553    BNP No results found for: "BNP"  ProBNP No results found for: "PROBNP"  Specialty Problems       Pulmonary Problems   RHINITIS, ALLERGIC    Qualifier: Diagnosis of  By: Deirdre Peer MD, Erin        COPD exacerbation [facility])   COPD with acute exacerbation (HCC)    No Known Allergies  Immunization History  Administered Date(s) Administered   PFIZER(Purple Top)SARS-COV-2 Vaccination 07/10/2020   Td 10/05/1993, 01/16/2008   Tdap 10/05/1993, 01/16/2008    Past Medical History:  Diagnosis Date   Anxiety    Arteriosclerosis    Arthritis    "hands, arms, fingers, wrists, elbows, shoulders, all down my spine" (07/29/2017)   Chronic back pain    COPD (chronic obstructive pulmonary disease) (HCC)    [name] 07/28/2017   Depression     Glaucoma, both eyes    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)    "since 2010; last one was 2016; never did find out why" (07/29/2017)   Headache    "a few times/week" (07/28/2017)   History of stomach ulcers 1978   Insomnia    Menopause    Migraine    "maybe 3-4 q 6 months" (07/28/2017)   Pneumonia    "when I was a kid" (07/29/2017)   Restless legs    Seizure (HCC)    Suicide and self-inflicted injury (HCC)    Tachycardia     Tobacco History: Social History   Tobacco Use  Smoking Status Former   Packs/day: 1.00   Years: 40.00   Total pack years: 40.00   Types: Cigarettes   Quit date: 01/27/2015   Years since quitting: 7.2  Smokeless Tobacco Never   Counseling given: Not Answered   Continue to not smoke  Outpatient Encounter Medications as of 05/06/2022  Medication Sig   albuterol (ACCUNEB) 1.25 MG/3ML nebulizer solution Take 1 ampule by nebulization daily as needed for wheezing.   ATROVENT HFA 17 MCG/ACT inhaler Inhale 2 puffs into the lungs 4 (four) times daily.   celecoxib (CELEBREX) 200 MG capsule Take 200 mg by mouth daily.   cyclobenzaprine (FLEXERIL) 10 MG tablet Take 10 mg by mouth daily as needed for muscle spasms.   gabapentin (NEURONTIN) 600 MG tablet Take 300 mg by mouth daily.   lidocaine (LIDODERM) 5 % Place 1 patch onto the skin daily. Remove & Discard patch within 12 hours or as directed by MD: For pain (Patient taking differently: Place 0.5 patches onto the skin as needed (pain).)   pantoprazole (PROTONIX) 40 MG tablet Take 1 tablet (40 mg total) by mouth 2 (two) times daily before a meal.   pramipexole (MIRAPEX) 1 MG tablet Take 1 mg by mouth daily.   traMADol (ULTRAM) 50 MG tablet Take 50 mg by mouth 2 (two) times daily as needed for moderate pain.   budesonide-formoterol (SYMBICORT) 160-4.5 MCG/ACT inhaler Inhale 2 puffs into the lungs 2 (two) times daily as needed (shortness of breath). (Patient not taking: Reported on 05/06/2022)   No facility-administered encounter  medications on file as of 05/06/2022.     Review of Systems  Review of Systems  No chest pain with exertion.  No orthopnea or PND.  No lower EXTR swelling.  Comprehensive review of systems otherwise negative. Physical Exam  BP 122/66 (BP Location: Left Arm, Patient Position: Sitting, Cuff Size: Normal)   Pulse 77   Ht 5' (1.524 m)   Wt  97 lb 12.8 oz (44.4 kg)   SpO2 96%   BMI 19.10 kg/m   Wt Readings from Last 5 Encounters:  05/06/22 97 lb 12.8 oz (44.4 kg)  04/03/22 94 lb (42.6 kg)  03/31/22 94 lb (42.6 kg)  02/04/22 95 lb 3.2 oz (43.2 kg)  01/30/22 102 lb (46.3 kg)    BMI Readings from Last 5 Encounters:  05/06/22 19.10 kg/m  04/03/22 18.36 kg/m  03/31/22 18.36 kg/m  02/04/22 18.59 kg/m  01/30/22 19.92 kg/m     Physical Exam General: Well-appearing, no acute distress Eyes: EOMI, no icterus Neck: Supple, JVP Pulmonary: Scattered wheezes, normal work of breathing Cardiovascular: Warm, no edema Abdomen: Nondistended, bowel sounds present MSK: No synovitis, no joint effusion Neuro: Normal gait, no weakness Psych: Normal mood, full affect   Assessment & Plan:   Emphysema: With exacerbations including hospitalization in 2019 with hypoxemia.  No PFTs to formally diagnose COPD.  PFTs for further evaluation.  Continue triple inhaled therapy via Symbicort and Atrovent.  Will reevaluate if Breztri or Trelegy are added to the [facility] formulary we can use in the future.  Bronchiectasis: Right middle lobe lingula and bilateral left lower lobe bronchiectasis is mild.  NTM disease most likely combination of chronic bronchitis, infection.  Consider addition of nebulized hypertonic saline, flutter valve if cough becomes more of an issue.  Patient concern for hypoxemia: Walk today without desaturation.  We will check overnight oximetry given her emphysema to evaluate for nocturnal hypoxemia.   Return in about 3 months (around 08/06/2022).   Karren Burly,  MD 05/06/2022

## 2022-06-19 ENCOUNTER — Telehealth: Payer: Self-pay | Admitting: Pulmonary Disease

## 2022-06-19 ENCOUNTER — Other Ambulatory Visit: Payer: Self-pay

## 2022-06-19 DIAGNOSIS — R7981 Abnormal blood-gas level: Secondary | ICD-10-CM

## 2022-06-19 NOTE — Telephone Encounter (Signed)
Added DME to chart

## 2022-08-10 ENCOUNTER — Other Ambulatory Visit: Payer: Self-pay | Admitting: Pulmonary Disease

## 2022-08-10 DIAGNOSIS — J432 Centrilobular emphysema: Secondary | ICD-10-CM

## 2022-08-25 ENCOUNTER — Encounter: Payer: Self-pay | Admitting: Internal Medicine

## 2022-09-28 IMAGING — CT CT CHEST LUNG CANCER SCREENING LOW DOSE W/O CM
1 series · 10 of 10 positions shown, 13 images · non-contrast
Comparison: None.

CLINICAL DATA: Former smoker with 40 pack-year history



[ct lung segmentation data · axial · 0.62mm/px · z∈[-386,-386]mm · 10 of 370 frames shown]
[frame 1/370  mediastinal]
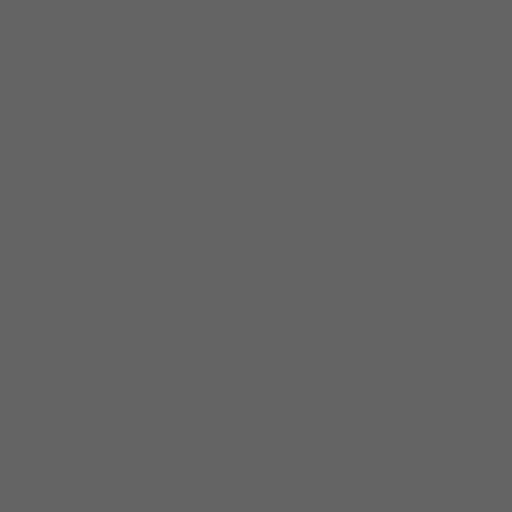
[frame 1/370  lung]
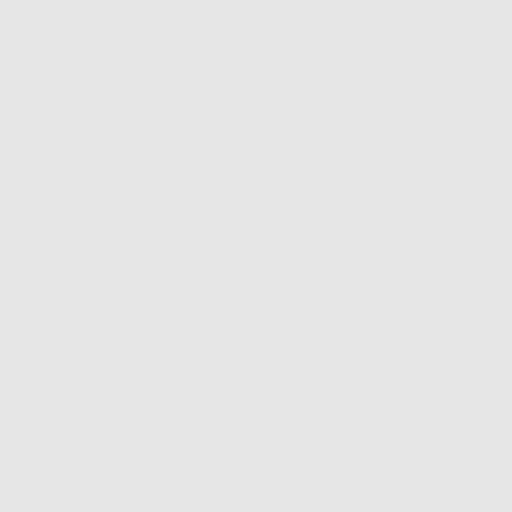
[frame 42/370  lung]
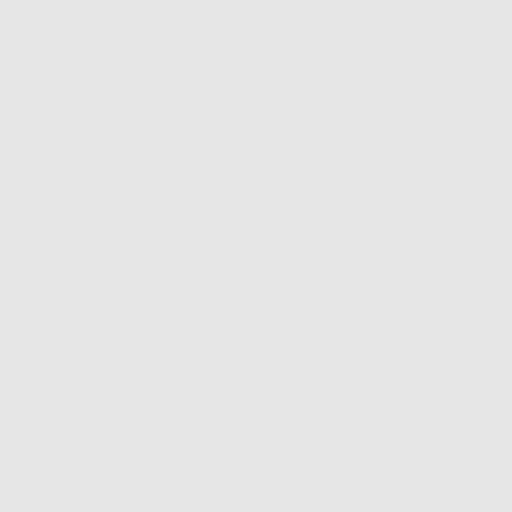
[frame 83/370  lung]
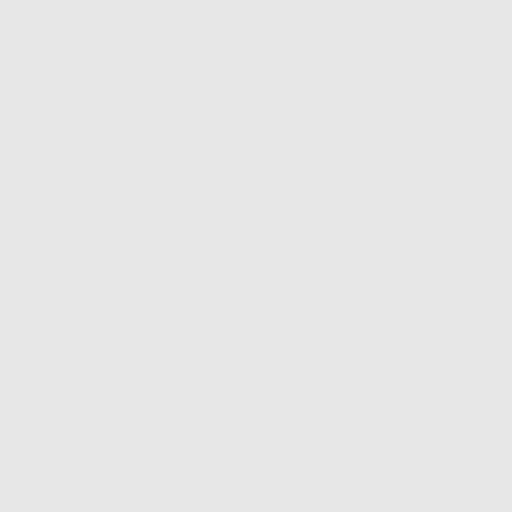
[frame 124/370  lung]
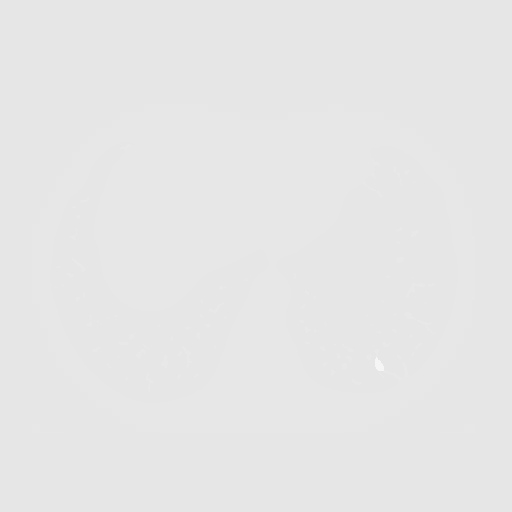
[frame 165/370  mediastinal]
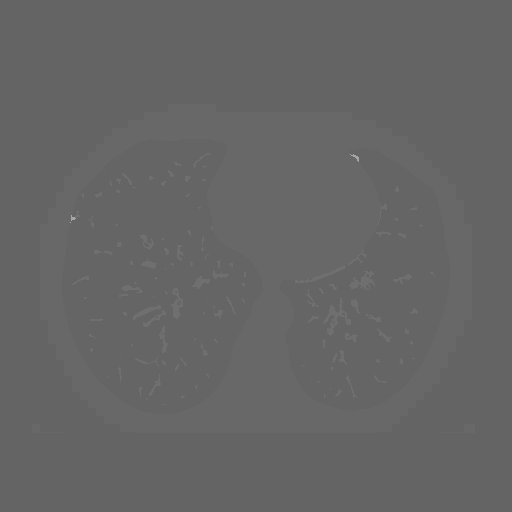
[frame 165/370  lung]
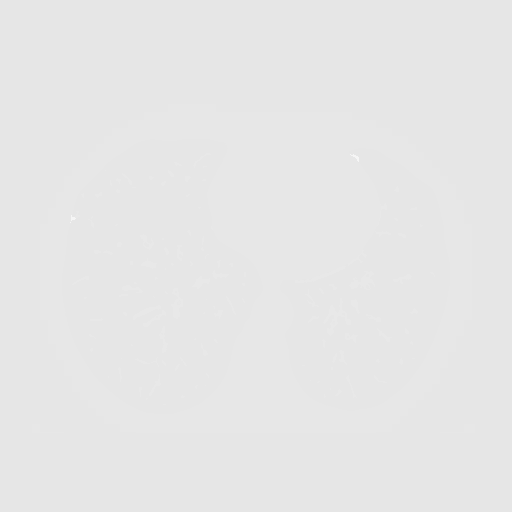
[frame 206/370  lung]
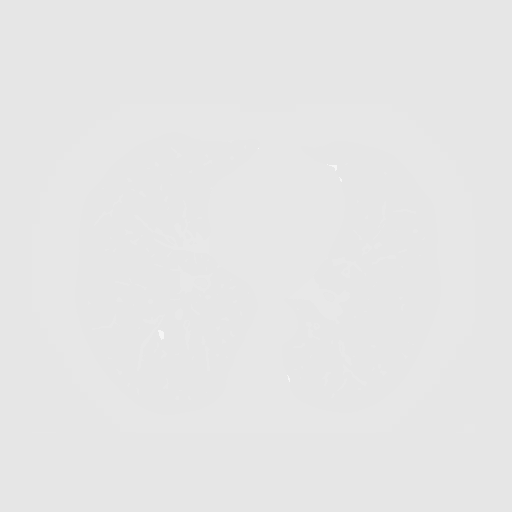
[frame 247/370  lung]
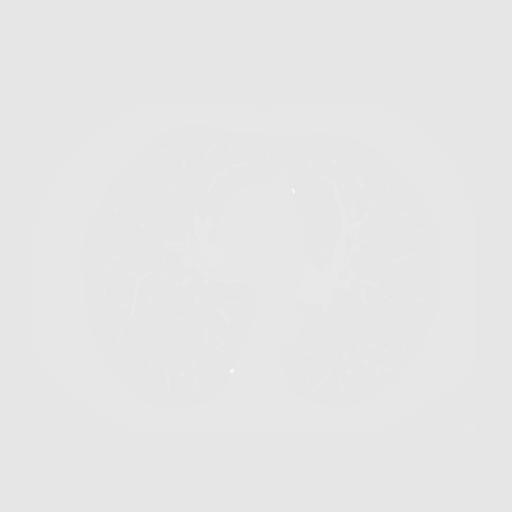
[frame 288/370  lung]
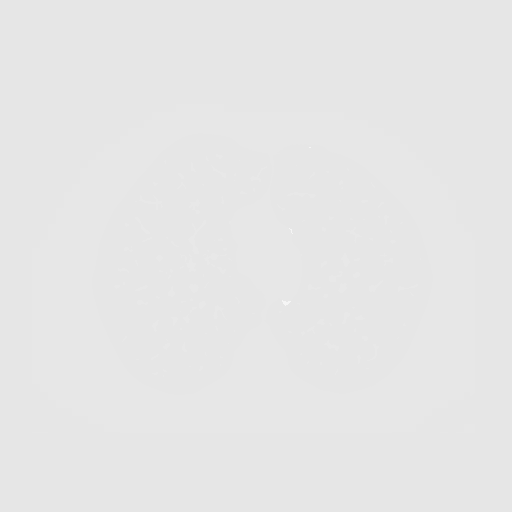
[frame 329/370  mediastinal]
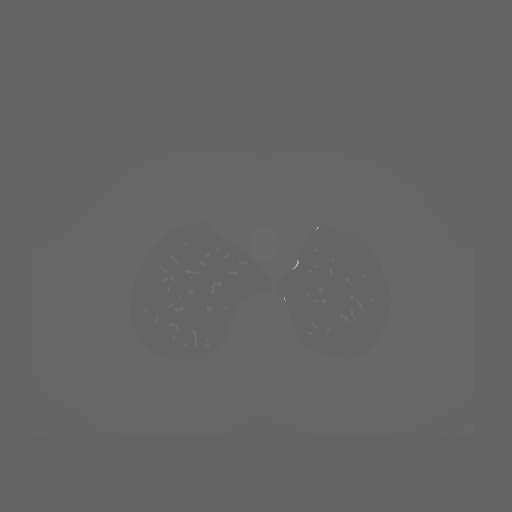
[frame 329/370  lung]
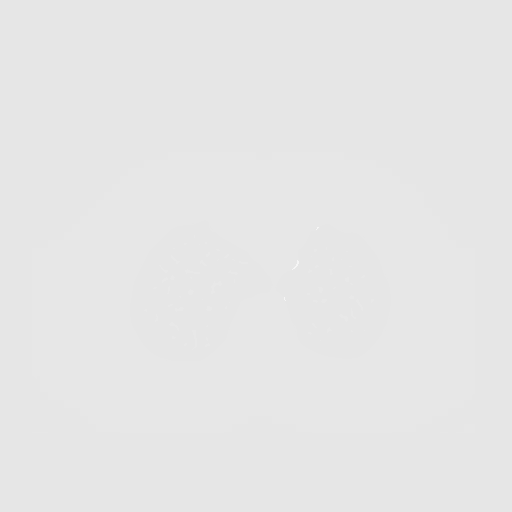
[frame 370/370  lung]
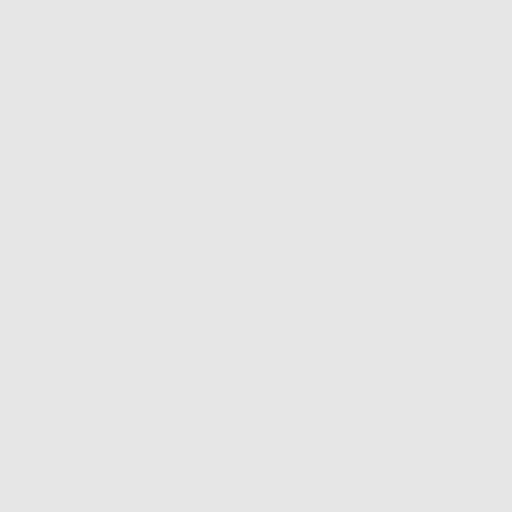

[10 of 10 positions shown; findings below may reference images not displayed]

FINDINGS: Cardiovascular: No high size. No pericardial effusion.
Atherosclerotic disease of the thoracic aorta.

Mediastinum/Nodes: Esophagus and thyroid are unremarkable. No
pathologically enlarged lymph nodes seen in the chest.

Lungs/Pleura: Central airways are patent. Centrilobular emphysema.
Linear opacity of the left lower lobe and likely due to scarring or
atelectasis. Small solid and ground-glass pulmonary nodules. Largest
solid nodule is located in the right lower lobe and measures 5.8 mm
in mean diameter on image 171. Non solid nodule of the left upper
lobe measuring 11.1 mm in mean diameter on image 42.

Upper Abdomen: Mildly dilated common bile duct measuring up to 9 mm.

Musculoskeletal: No chest wall mass or suspicious bone lesions
identified.
IMPRESSION: 1. Lung-RADS 2S, benign appearance or behavior. Continue annual
screening with low-dose chest CT without contrast in 12 months. S
modifier for mild dilation of the common bile duct.
2. Mildly dilated common bile duct, measuring up to 9 mm. Correlate
with liver function tests. If LFTs are abnormal, recommend MRCP for
further evaluation.
3. Aortic Atherosclerosis (REZ42-HSU.U) and Emphysema (REZ42-BQ1.0).

## 2022-10-14 ENCOUNTER — Other Ambulatory Visit: Payer: Self-pay | Admitting: Internal Medicine

## 2022-10-31 ENCOUNTER — Other Ambulatory Visit: Payer: Self-pay | Admitting: Internal Medicine

## 2022-12-15 ENCOUNTER — Other Ambulatory Visit: Payer: Self-pay | Admitting: Internal Medicine

## 2023-01-15 ENCOUNTER — Other Ambulatory Visit: Payer: Self-pay | Admitting: Internal Medicine

## 2023-01-25 NOTE — Progress Notes (Deleted)
GI Office Note    Referring Provider: Roger Kill, * Primary Care Physician:  Bernita Buffy  Primary Gastroenterologist: Hennie Duos. Marletta Lor, DO   Chief Complaint   No chief complaint on file.   History of Present Illness   Doris Higgins is a 67 y.o. female presenting today   Last seen May 2023.  Chronic nausea since 2005, poor appetite.  Eats enough to maintain weight.  Symptoms aggravated by stress.  CT head without contrast in May 2022 unremarkable.  History of BC powder use from 2000-2016.  More recently on Celebrex.  History of incidental finding of dilated common bile duct up to 9 mm on CT chest imaging, LFTs normal.  MRCP completed showing common bile duct of 6 mm.  No evidence of biliary ductal dilation or choledocholithiasis.  EGD June 2023: 2 cm hiatal hernia, gastritis, pathology showed mild chronic gastritis, no H. pylori.  Colonoscopy June 2023: Preparation of the colon was fair, hemorrhoids found on perianal exam, diverticulosis of the sigmoid colon, repeat colonoscopy in 5 years for screening purposes.         Medications   Current Outpatient Medications  Medication Sig Dispense Refill   albuterol (ACCUNEB) 1.25 MG/3ML nebulizer solution Take 1 ampule by nebulization daily as needed for wheezing.     ATROVENT HFA 17 MCG/ACT inhaler Inhale 2 puffs into the lungs 4 (four) times daily.     celecoxib (CELEBREX) 200 MG capsule Take 200 mg by mouth daily.     cyclobenzaprine (FLEXERIL) 10 MG tablet Take 10 mg by mouth daily as needed for muscle spasms.     gabapentin (NEURONTIN) 600 MG tablet Take 300 mg by mouth daily.     lidocaine (LIDODERM) 5 % Place 1 patch onto the skin daily. Remove & Discard patch within 12 hours or as directed by MD: For pain (Patient taking differently: Place 0.5 patches onto the skin as needed (pain).) 30 patch 0   pantoprazole (PROTONIX) 40 MG tablet Take 1 tablet (40 mg total) by mouth 2 (two) times daily  before a meal. 60 tablet 11   pramipexole (MIRAPEX) 1 MG tablet Take 1 mg by mouth daily.     SYMBICORT 160-4.5 MCG/ACT inhaler INHALE 2 PUFFS INTO THE LUNGS 2 (TWO) TIMES DAILY AS NEEDED (SHORTNESS OF BREATH). 30.6 each 1   traMADol (ULTRAM) 50 MG tablet Take 50 mg by mouth 2 (two) times daily as needed for moderate pain.     No current facility-administered medications for this visit.    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)    "since 2010; last one was 2016; never did find out why" (07/29/2017)   Headache    "a few times/week" (07/28/2017)   History of stomach ulcers 1978   Insomnia    Menopause    Migraine    "maybe 3-4 q 6 months" (07/28/2017)   Pneumonia    "when I was a kid" (07/29/2017)   Restless legs    Seizure (HCC)  Suicide and self-inflicted injury (HCC)    Tachycardia     Past Surgical History   Past Surgical History:  Procedure Laterality Date   ABDOMINAL HYSTERECTOMY  2000   BILATERAL OOPHORECTOMY Bilateral 2007   for mass which was found to be non -malignant per patient report   BIOPSY  04/03/2022   Procedure: BIOPSY;  Surgeon: Lanelle Bal, DO;  Location: [facility];  Service: Endoscopy;;  gastric   CATARACT EXTRACTION W/PHACO Left 03/12/2014   Procedure: CATARACT EXTRACTION PHACO AND INTRAOCULAR LENS PLACEMENT (IOC);  Surgeon: Gemma Payor, MD;  Location: [facility];  Service: Ophthalmology;  Laterality: Left;  CDE:7.05   CATARACT EXTRACTION W/PHACO Right 07/23/2014   Procedure: CATARACT EXTRACTION PHACO AND INTRAOCULAR LENS PLACEMENT (IOC); CDE 0.37;  Surgeon: Susa Simmonds, MD;  Location: [facility];  Service: Ophthalmology;  Laterality: Right;  CDE:  0.37   COLONOSCOPY WITH PROPOFOL N/A 04/03/2022   Procedure: COLONOSCOPY WITH PROPOFOL;  Surgeon: Lanelle Bal, DO;  Location: [facility];  Service: Endoscopy;  Laterality: N/A;  1:15pm   ESOPHAGOGASTRODUODENOSCOPY (EGD) WITH PROPOFOL N/A 04/03/2022   Procedure: ESOPHAGOGASTRODUODENOSCOPY (EGD) WITH PROPOFOL;  Surgeon: Lanelle Bal, DO;  [facility]: [facility];  Service: Endoscopy;  Laterality: N/A;   TUBAL LIGATION  1984    Past Family History   Family History  Problem Relation Age of Onset   Alcohol abuse Mother    Depression Mother    Alcohol abuse Father    Cancer Father        lung ca   Bladder Cancer Sister    Cancer Maternal Aunt        breast   Cancer Paternal Aunt        breast   Bladder Cancer Paternal Uncle    Colon cancer Neg Hx     Past Social History   Social History   Socioeconomic History   Marital status: Widowed    Spouse name: Not on file   Number of children: Not on file   Years of education: Not on file   Highest education level: Not on file  Occupational History   Not on file  Tobacco Use   Smoking status: Former    Packs/day: 1.00    Years: 40.00    Additional pack years: 0.00    Total pack years: 40.00    Types: Cigarettes    Quit date: 01/27/2015    Years since quitting: 8.0   Smokeless tobacco: Never  Vaping Use   Vaping Use: Former  Substance and Sexual Activity   Alcohol use: No   Drug use: No   Sexual activity: Not Currently    Birth control/protection: None, Surgical  Other Topics Concern   Not on file  Social History Narrative   Not on file   Social Determinants of Health   Financial Resource Strain: Not on file  Food Insecurity: Not on file  Transportation Needs: Not on file  Physical Activity: Not on file  Stress: Not on file  Social Connections: Not on file  Intimate Partner Violence: Not on file    Review of Systems    General: Negative for anorexia, weight loss, fever, chills, fatigue, weakness. ENT: Negative for hoarseness, difficulty swallowing , nasal congestion. CV: Negative for chest pain, angina, palpitations, dyspnea on exertion, peripheral edema.  Respiratory: Negative for dyspnea at rest, dyspnea on exertion, cough, sputum, wheezing.  GI: See history of present illness. GU:  Negative for dysuria, hematuria, urinary incontinence,  urinary frequency, nocturnal urination.  Endo: Negative for unusual weight change.     Physical Exam   There were no vitals taken for this visit.   General: Well-nourished, well-developed in no acute distress.  Eyes: No icterus. Mouth: Oropharyngeal mucosa moist and pink , no lesions erythema or exudate. Lungs: Clear to auscultation bilaterally.  Heart: Regular rate and rhythm, no murmurs rubs or gallops.  Abdomen: Bowel sounds are normal, nontender, nondistended, no hepatosplenomegaly or masses,  no abdominal bruits or hernia , no rebound or guarding.  Rectal: ***  Extremities: No lower extremity edema. No clubbing or deformities. Neuro: Alert and oriented x 4   Skin: Warm and dry, no jaundice.   Psych: Alert and cooperative, normal mood and affect.  Labs   *** Imaging Studies   No results found.  Assessment       PLAN   ***   Leanna Battles. Melvyn Neth, MHS, PA-C [facility]

## 2023-01-26 ENCOUNTER — Ambulatory Visit: Payer: 59 | Admitting: Gastroenterology

## 2023-02-03 ENCOUNTER — Ambulatory Visit: Payer: 59 | Admitting: Gastroenterology

## 2023-03-02 ENCOUNTER — Other Ambulatory Visit: Payer: Self-pay | Admitting: Pulmonary Disease

## 2023-03-02 DIAGNOSIS — J432 Centrilobular emphysema: Secondary | ICD-10-CM

## 2023-03-07 NOTE — Progress Notes (Deleted)
GI Office Note    Referring Provider: Roger Kill, * Primary Care Physician:  Bernita Buffy  Primary Gastroenterologist:  Chief Complaint   No chief complaint on file.   History of Present Illness   Doris Higgins is a 67 y.o. female presenting today, last seen in 02/2022. Chronic nausea since 2005, poor appetite. Eats enough to maintain weight. Aggravated by stress. CT head without contrast 02/221 unremarkable. H/O BC powder use from 2000-2016. More recently Celbrex. H/o incidental finding of dilated CBD up to 9mm on CT chest imaging. LFTs normal. MRCP completed showing CBD 6mm. No evidence of biliary ductal dilation or choledocholithiasis.  EGD 03/2022: 2cm hh, gastritis, pathology showed mild chronic gastritis, no h.pylori.  Colonoscopy 03/2022: preparation of colon fair, hemorrhoids found on perianal exam, diverticulosis of sigmoid colon, repeat colonoscopy in 5 years for screening purposes.    Labs 02/2023: WBC 7600, Hgb 13.7, Platelet 251000, cre 0.63, Tbili 0.3, AP 97, AST 21, ALT 28,   CTA abd aorta and bilateral iliofem funoff w wo contrast 02/2023: VASCULAR  -Atherosclerosis of abdominal aorta is noted without aneurysm or  dissection.  -No significant stenosis is seen involving the visualized arterial  vasculature of the lower extremities.  -celiac, SMA, IMA patent  NON-VASCULAR  -Sigmoid diverticulosis without inflammation.   Medications   Current Outpatient Medications  Medication Sig Dispense Refill   albuterol (ACCUNEB) 1.25 MG/3ML nebulizer solution Take 1 ampule by nebulization daily as needed for wheezing.     ATROVENT HFA 17 MCG/ACT inhaler Inhale 2 puffs into the lungs 4 (four) times daily.     budesonide-formoterol (SYMBICORT) 160-4.5 MCG/ACT inhaler INHALE 2 PUFFS INTO THE LUNGS 2 (TWO) TIMES DAILY AS NEEDED (SHORTNESS OF BREATH). 30.6 each 2   celecoxib (CELEBREX) 200 MG capsule Take 200 mg by mouth daily.      cyclobenzaprine (FLEXERIL) 10 MG tablet Take 10 mg by mouth daily as needed for muscle spasms.     gabapentin (NEURONTIN) 600 MG tablet Take 300 mg by mouth daily.     lidocaine (LIDODERM) 5 % Place 1 patch onto the skin daily. Remove & Discard patch within 12 hours or as directed by MD: For pain (Patient taking differently: Place 0.5 patches onto the skin as needed (pain).) 30 patch 0   pantoprazole (PROTONIX) 40 MG tablet Take 1 tablet (40 mg total) by mouth 2 (two) times daily before a meal. 60 tablet 11   pramipexole (MIRAPEX) 1 MG tablet Take 1 mg by mouth daily.     traMADol (ULTRAM) 50 MG tablet Take 50 mg by mouth 2 (two) times daily as needed for moderate pain.     No current facility-administered medications for this visit.    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)    "since 2010; last one was 2016; never did find out why" (07/29/2017)   Headache    "a few times/week" (07/28/2017)   History of stomach ulcers 1978   Insomnia    Menopause  Migraine    "maybe 3-4 q 6 months" (07/28/2017)   Pneumonia    "when I was a kid" (07/29/2017)   Restless legs    Seizure (HCC)    Suicide and self-inflicted injury (HCC)    Tachycardia     Past Surgical History   Past Surgical History:  Procedure Laterality Date   ABDOMINAL HYSTERECTOMY  2000   BILATERAL OOPHORECTOMY Bilateral 2007   for mass which was found to be non -malignant per patient report   BIOPSY  04/03/2022   Procedure: BIOPSY;  Surgeon: Lanelle Bal, DO;  [facility]: [facility];  Service: Endoscopy;;  gastric   CATARACT EXTRACTION  W/PHACO Left 03/12/2014   Procedure: CATARACT EXTRACTION PHACO AND INTRAOCULAR LENS PLACEMENT (IOC);  Surgeon: Gemma Payor, MD;  Location: [facility];  Service: Ophthalmology;  Laterality: Left;  CDE:7.05   CATARACT EXTRACTION W/PHACO Right 07/23/2014   Procedure: CATARACT EXTRACTION PHACO AND INTRAOCULAR LENS PLACEMENT (IOC); CDE 0.37;  Surgeon: Susa Simmonds, MD;  Location: [facility];  Service: Ophthalmology;  Laterality: Right;  CDE:  0.37   COLONOSCOPY WITH PROPOFOL N/A 04/03/2022   Procedure: COLONOSCOPY WITH PROPOFOL;  Surgeon: Lanelle Bal, DO;  [facility]: [facility];  Service: Endoscopy;  Laterality: N/A;  1:15pm   ESOPHAGOGASTRODUODENOSCOPY (EGD) WITH PROPOFOL N/A 04/03/2022   Procedure: ESOPHAGOGASTRODUODENOSCOPY (EGD) WITH PROPOFOL;  Surgeon: Lanelle Bal, DO;  [facility]: [facility];  Service: Endoscopy;  Laterality: N/A;   TUBAL LIGATION  1984    Past Family History   Family History  Problem Relation Age of Onset   Alcohol abuse Mother    Depression Mother    Alcohol abuse Father    Cancer Father        lung ca   Bladder Cancer Sister    Cancer Maternal Aunt        breast   Cancer Paternal Aunt        breast   Bladder Cancer Paternal Uncle    Colon cancer Neg Hx     Past Social History   Social History   Socioeconomic History   Marital status: Widowed    Spouse name: Not on file   Number of children: Not on file   Years of education: Not on file   Highest education level: Not on file  Occupational History   Not on file  Tobacco Use   Smoking status: Former    Packs/day: 1.00    Years: 40.00    Additional pack years: 0.00    Total pack years: 40.00    Types: Cigarettes    Quit date: 01/27/2015    Years since quitting: 8.1   Smokeless tobacco: Never  Vaping Use   Vaping Use: Former  Substance and Sexual Activity   Alcohol use: No   Drug use: No   Sexual activity: Not Currently    Birth control/protection: None, Surgical  Other Topics Concern    Not on file  Social History Narrative   Not on file   Social Determinants of Health   Financial Resource Strain: Not on file  Food Insecurity: Not on file  Transportation Needs: Not on file  Physical Activity: Not on file  Stress: Not on file  Social Connections: Not on file  Intimate Partner Violence: Not on file    Review of Systems   General: Negative for anorexia, weight loss, fever, chills, fatigue, weakness. ENT: Negative for hoarseness, difficulty swallowing , nasal congestion. CV: Negative for chest pain, angina,  palpitations, dyspnea on exertion, peripheral edema.  Respiratory: Negative for dyspnea at rest, dyspnea on exertion, cough, sputum, wheezing.  GI: See history of present illness. GU:  Negative for dysuria, hematuria, urinary incontinence, urinary frequency, nocturnal urination.  Endo: Negative for unusual weight change.     Physical Exam   There were no vitals taken for this visit.   General: Well-nourished, well-developed in no acute distress.  Eyes: No icterus. Mouth: Oropharyngeal mucosa moist and pink , no lesions erythema or exudate. Lungs: Clear to auscultation bilaterally.  Heart: Regular rate and rhythm, no murmurs rubs or gallops.  Abdomen: Bowel sounds are normal, nontender, nondistended, no hepatosplenomegaly or masses,  no abdominal bruits or hernia , no rebound or guarding.  Rectal: ***  Extremities: No lower extremity edema. No clubbing or deformities. Neuro: Alert and oriented x 4   Skin: Warm and dry, no jaundice.   Psych: Alert and cooperative, normal mood and affect.  Labs   *** Imaging Studies   No results found.  Assessment       PLAN   ***   Leanna Battles. Melvyn Neth, MHS, PA-C [facility]

## 2023-03-08 ENCOUNTER — Ambulatory Visit: Payer: 59 | Admitting: Gastroenterology

## 2023-03-08 ENCOUNTER — Encounter: Payer: Self-pay | Admitting: Gastroenterology

## 2023-04-05 DIAGNOSIS — Z299 Encounter for prophylactic measures, unspecified: Secondary | ICD-10-CM | POA: Diagnosis not present

## 2023-04-05 DIAGNOSIS — M1990 Unspecified osteoarthritis, unspecified site: Secondary | ICD-10-CM | POA: Diagnosis not present

## 2023-04-05 DIAGNOSIS — J449 Chronic obstructive pulmonary disease, unspecified: Secondary | ICD-10-CM | POA: Diagnosis not present

## 2023-04-05 DIAGNOSIS — M62838 Other muscle spasm: Secondary | ICD-10-CM | POA: Diagnosis not present

## 2023-04-19 DIAGNOSIS — J432 Centrilobular emphysema: Secondary | ICD-10-CM | POA: Diagnosis not present

## 2023-04-28 DIAGNOSIS — J449 Chronic obstructive pulmonary disease, unspecified: Secondary | ICD-10-CM | POA: Diagnosis not present

## 2023-04-28 DIAGNOSIS — M545 Low back pain, unspecified: Secondary | ICD-10-CM | POA: Diagnosis not present

## 2023-04-28 DIAGNOSIS — M62838 Other muscle spasm: Secondary | ICD-10-CM | POA: Diagnosis not present

## 2023-04-28 DIAGNOSIS — M1990 Unspecified osteoarthritis, unspecified site: Secondary | ICD-10-CM | POA: Diagnosis not present

## 2023-04-28 DIAGNOSIS — M25511 Pain in right shoulder: Secondary | ICD-10-CM | POA: Diagnosis not present

## 2023-05-20 DIAGNOSIS — J432 Centrilobular emphysema: Secondary | ICD-10-CM | POA: Diagnosis not present

## 2023-06-20 DIAGNOSIS — J432 Centrilobular emphysema: Secondary | ICD-10-CM | POA: Diagnosis not present

## 2023-06-24 DIAGNOSIS — Z79899 Other long term (current) drug therapy: Secondary | ICD-10-CM | POA: Diagnosis not present

## 2023-06-24 DIAGNOSIS — R5383 Other fatigue: Secondary | ICD-10-CM | POA: Diagnosis not present

## 2023-08-09 ENCOUNTER — Other Ambulatory Visit: Payer: Self-pay | Admitting: Physician Assistant

## 2023-08-09 DIAGNOSIS — Z1231 Encounter for screening mammogram for malignant neoplasm of breast: Secondary | ICD-10-CM

## 2023-09-10 DIAGNOSIS — J449 Chronic obstructive pulmonary disease, unspecified: Secondary | ICD-10-CM | POA: Diagnosis not present

## 2023-09-10 DIAGNOSIS — Z299 Encounter for prophylactic measures, unspecified: Secondary | ICD-10-CM | POA: Diagnosis not present

## 2023-09-10 DIAGNOSIS — R110 Nausea: Secondary | ICD-10-CM | POA: Diagnosis not present

## 2023-09-10 DIAGNOSIS — I10 Essential (primary) hypertension: Secondary | ICD-10-CM | POA: Diagnosis not present

## 2023-09-10 DIAGNOSIS — Z0000 Encounter for general adult medical examination without abnormal findings: Secondary | ICD-10-CM | POA: Diagnosis not present

## 2023-09-10 DIAGNOSIS — R52 Pain, unspecified: Secondary | ICD-10-CM | POA: Diagnosis not present

## 2023-09-10 DIAGNOSIS — Z87891 Personal history of nicotine dependence: Secondary | ICD-10-CM | POA: Diagnosis not present

## 2023-09-19 DIAGNOSIS — J432 Centrilobular emphysema: Secondary | ICD-10-CM | POA: Diagnosis not present

## 2023-10-20 DIAGNOSIS — J432 Centrilobular emphysema: Secondary | ICD-10-CM | POA: Diagnosis not present

## 2023-10-24 DIAGNOSIS — R000 Tachycardia, unspecified: Secondary | ICD-10-CM | POA: Diagnosis not present

## 2023-10-24 DIAGNOSIS — H538 Other visual disturbances: Secondary | ICD-10-CM | POA: Diagnosis not present

## 2023-11-09 DIAGNOSIS — R059 Cough, unspecified: Secondary | ICD-10-CM | POA: Diagnosis not present

## 2023-11-09 DIAGNOSIS — M1990 Unspecified osteoarthritis, unspecified site: Secondary | ICD-10-CM | POA: Diagnosis not present

## 2023-11-20 DIAGNOSIS — J432 Centrilobular emphysema: Secondary | ICD-10-CM | POA: Diagnosis not present

## 2023-12-09 ENCOUNTER — Encounter: Payer: Self-pay | Admitting: Pulmonary Disease

## 2023-12-18 DIAGNOSIS — J432 Centrilobular emphysema: Secondary | ICD-10-CM | POA: Diagnosis not present

## 2024-01-03 DIAGNOSIS — J441 Chronic obstructive pulmonary disease with (acute) exacerbation: Secondary | ICD-10-CM | POA: Diagnosis not present

## 2024-01-03 DIAGNOSIS — M5030 Other cervical disc degeneration, unspecified cervical region: Secondary | ICD-10-CM | POA: Diagnosis not present

## 2024-01-03 DIAGNOSIS — Z79899 Other long term (current) drug therapy: Secondary | ICD-10-CM | POA: Diagnosis not present

## 2024-01-03 DIAGNOSIS — R058 Other specified cough: Secondary | ICD-10-CM | POA: Diagnosis not present

## 2024-01-03 DIAGNOSIS — R569 Unspecified convulsions: Secondary | ICD-10-CM | POA: Diagnosis not present

## 2024-01-03 DIAGNOSIS — Z87891 Personal history of nicotine dependence: Secondary | ICD-10-CM | POA: Diagnosis not present

## 2024-01-03 DIAGNOSIS — Z20822 Contact with and (suspected) exposure to covid-19: Secondary | ICD-10-CM | POA: Diagnosis not present

## 2024-01-03 DIAGNOSIS — G2581 Restless legs syndrome: Secondary | ICD-10-CM | POA: Diagnosis not present

## 2024-01-03 DIAGNOSIS — R9431 Abnormal electrocardiogram [ECG] [EKG]: Secondary | ICD-10-CM | POA: Diagnosis not present

## 2024-01-11 DIAGNOSIS — R918 Other nonspecific abnormal finding of lung field: Secondary | ICD-10-CM | POA: Diagnosis not present

## 2024-01-11 DIAGNOSIS — Z87891 Personal history of nicotine dependence: Secondary | ICD-10-CM | POA: Diagnosis not present

## 2024-01-11 DIAGNOSIS — R9431 Abnormal electrocardiogram [ECG] [EKG]: Secondary | ICD-10-CM | POA: Diagnosis not present

## 2024-01-11 DIAGNOSIS — R0602 Shortness of breath: Secondary | ICD-10-CM | POA: Diagnosis not present

## 2024-01-11 DIAGNOSIS — Z79899 Other long term (current) drug therapy: Secondary | ICD-10-CM | POA: Diagnosis not present

## 2024-01-11 DIAGNOSIS — R569 Unspecified convulsions: Secondary | ICD-10-CM | POA: Diagnosis not present

## 2024-01-11 DIAGNOSIS — G2581 Restless legs syndrome: Secondary | ICD-10-CM | POA: Diagnosis not present

## 2024-01-11 DIAGNOSIS — Z20822 Contact with and (suspected) exposure to covid-19: Secondary | ICD-10-CM | POA: Diagnosis not present

## 2024-01-11 DIAGNOSIS — J9611 Chronic respiratory failure with hypoxia: Secondary | ICD-10-CM | POA: Diagnosis not present

## 2024-01-11 DIAGNOSIS — J449 Chronic obstructive pulmonary disease, unspecified: Secondary | ICD-10-CM | POA: Diagnosis not present

## 2024-01-11 DIAGNOSIS — J690 Pneumonitis due to inhalation of food and vomit: Secondary | ICD-10-CM | POA: Diagnosis not present

## 2024-01-14 DIAGNOSIS — J441 Chronic obstructive pulmonary disease with (acute) exacerbation: Secondary | ICD-10-CM | POA: Diagnosis not present

## 2024-01-14 DIAGNOSIS — J690 Pneumonitis due to inhalation of food and vomit: Secondary | ICD-10-CM | POA: Diagnosis not present

## 2024-01-14 DIAGNOSIS — Z6820 Body mass index (BMI) 20.0-20.9, adult: Secondary | ICD-10-CM | POA: Diagnosis not present

## 2024-01-14 DIAGNOSIS — I10 Essential (primary) hypertension: Secondary | ICD-10-CM | POA: Diagnosis not present

## 2024-01-14 DIAGNOSIS — Z299 Encounter for prophylactic measures, unspecified: Secondary | ICD-10-CM | POA: Diagnosis not present

## 2024-01-18 DIAGNOSIS — M5030 Other cervical disc degeneration, unspecified cervical region: Secondary | ICD-10-CM | POA: Diagnosis not present

## 2024-01-18 DIAGNOSIS — M1990 Unspecified osteoarthritis, unspecified site: Secondary | ICD-10-CM | POA: Diagnosis not present

## 2024-01-18 DIAGNOSIS — J432 Centrilobular emphysema: Secondary | ICD-10-CM | POA: Diagnosis not present

## 2024-01-18 DIAGNOSIS — G2581 Restless legs syndrome: Secondary | ICD-10-CM | POA: Diagnosis not present

## 2024-01-18 DIAGNOSIS — R404 Transient alteration of awareness: Secondary | ICD-10-CM | POA: Diagnosis not present

## 2024-01-18 DIAGNOSIS — Z87891 Personal history of nicotine dependence: Secondary | ICD-10-CM | POA: Diagnosis not present

## 2024-01-18 DIAGNOSIS — Z79899 Other long term (current) drug therapy: Secondary | ICD-10-CM | POA: Diagnosis not present

## 2024-01-18 DIAGNOSIS — J449 Chronic obstructive pulmonary disease, unspecified: Secondary | ICD-10-CM | POA: Diagnosis not present

## 2024-01-18 DIAGNOSIS — R001 Bradycardia, unspecified: Secondary | ICD-10-CM | POA: Diagnosis not present

## 2024-01-18 DIAGNOSIS — I499 Cardiac arrhythmia, unspecified: Secondary | ICD-10-CM | POA: Diagnosis not present

## 2024-01-18 DIAGNOSIS — E860 Dehydration: Secondary | ICD-10-CM | POA: Diagnosis not present

## 2024-01-18 DIAGNOSIS — R0902 Hypoxemia: Secondary | ICD-10-CM | POA: Diagnosis not present

## 2024-01-18 DIAGNOSIS — R569 Unspecified convulsions: Secondary | ICD-10-CM | POA: Diagnosis not present

## 2024-01-18 DIAGNOSIS — S0990XA Unspecified injury of head, initial encounter: Secondary | ICD-10-CM | POA: Diagnosis not present

## 2024-01-18 DIAGNOSIS — I6523 Occlusion and stenosis of bilateral carotid arteries: Secondary | ICD-10-CM | POA: Diagnosis not present

## 2024-01-18 DIAGNOSIS — K529 Noninfective gastroenteritis and colitis, unspecified: Secondary | ICD-10-CM | POA: Diagnosis not present

## 2024-01-18 DIAGNOSIS — R6889 Other general symptoms and signs: Secondary | ICD-10-CM | POA: Diagnosis not present

## 2024-01-18 DIAGNOSIS — R55 Syncope and collapse: Secondary | ICD-10-CM | POA: Diagnosis not present

## 2024-01-18 DIAGNOSIS — Z743 Need for continuous supervision: Secondary | ICD-10-CM | POA: Diagnosis not present

## 2024-02-04 DIAGNOSIS — M1990 Unspecified osteoarthritis, unspecified site: Secondary | ICD-10-CM | POA: Diagnosis not present

## 2024-02-04 DIAGNOSIS — I10 Essential (primary) hypertension: Secondary | ICD-10-CM | POA: Diagnosis not present

## 2024-02-04 DIAGNOSIS — J449 Chronic obstructive pulmonary disease, unspecified: Secondary | ICD-10-CM | POA: Diagnosis not present

## 2024-02-17 DIAGNOSIS — J432 Centrilobular emphysema: Secondary | ICD-10-CM | POA: Diagnosis not present

## 2024-02-22 DIAGNOSIS — Z299 Encounter for prophylactic measures, unspecified: Secondary | ICD-10-CM | POA: Diagnosis not present

## 2024-02-22 DIAGNOSIS — I10 Essential (primary) hypertension: Secondary | ICD-10-CM | POA: Diagnosis not present

## 2024-02-22 DIAGNOSIS — Z0289 Encounter for other administrative examinations: Secondary | ICD-10-CM | POA: Diagnosis not present

## 2024-02-22 DIAGNOSIS — J449 Chronic obstructive pulmonary disease, unspecified: Secondary | ICD-10-CM | POA: Diagnosis not present

## 2024-02-25 DIAGNOSIS — J449 Chronic obstructive pulmonary disease, unspecified: Secondary | ICD-10-CM | POA: Diagnosis not present

## 2024-03-01 DIAGNOSIS — I10 Essential (primary) hypertension: Secondary | ICD-10-CM | POA: Diagnosis not present

## 2024-03-01 DIAGNOSIS — M545 Low back pain, unspecified: Secondary | ICD-10-CM | POA: Diagnosis not present

## 2024-03-01 DIAGNOSIS — J441 Chronic obstructive pulmonary disease with (acute) exacerbation: Secondary | ICD-10-CM | POA: Diagnosis not present

## 2024-03-01 DIAGNOSIS — Z6820 Body mass index (BMI) 20.0-20.9, adult: Secondary | ICD-10-CM | POA: Diagnosis not present

## 2024-03-01 DIAGNOSIS — J690 Pneumonitis due to inhalation of food and vomit: Secondary | ICD-10-CM | POA: Diagnosis not present

## 2024-03-01 DIAGNOSIS — Z299 Encounter for prophylactic measures, unspecified: Secondary | ICD-10-CM | POA: Diagnosis not present

## 2024-03-19 DIAGNOSIS — J432 Centrilobular emphysema: Secondary | ICD-10-CM | POA: Diagnosis not present

## 2024-03-27 DIAGNOSIS — J449 Chronic obstructive pulmonary disease, unspecified: Secondary | ICD-10-CM | POA: Diagnosis not present

## 2024-04-18 DIAGNOSIS — J432 Centrilobular emphysema: Secondary | ICD-10-CM | POA: Diagnosis not present

## 2024-04-26 DIAGNOSIS — J449 Chronic obstructive pulmonary disease, unspecified: Secondary | ICD-10-CM | POA: Diagnosis not present

## 2024-05-05 DIAGNOSIS — J449 Chronic obstructive pulmonary disease, unspecified: Secondary | ICD-10-CM | POA: Diagnosis not present

## 2024-05-05 DIAGNOSIS — M1990 Unspecified osteoarthritis, unspecified site: Secondary | ICD-10-CM | POA: Diagnosis not present

## 2024-05-05 DIAGNOSIS — R0602 Shortness of breath: Secondary | ICD-10-CM | POA: Diagnosis not present

## 2024-05-05 DIAGNOSIS — R52 Pain, unspecified: Secondary | ICD-10-CM | POA: Diagnosis not present

## 2024-05-19 DIAGNOSIS — J432 Centrilobular emphysema: Secondary | ICD-10-CM | POA: Diagnosis not present

## 2024-05-27 DIAGNOSIS — J449 Chronic obstructive pulmonary disease, unspecified: Secondary | ICD-10-CM | POA: Diagnosis not present

## 2024-06-16 DIAGNOSIS — M25511 Pain in right shoulder: Secondary | ICD-10-CM | POA: Diagnosis not present

## 2024-06-16 DIAGNOSIS — J449 Chronic obstructive pulmonary disease, unspecified: Secondary | ICD-10-CM | POA: Diagnosis not present

## 2024-06-19 DIAGNOSIS — J432 Centrilobular emphysema: Secondary | ICD-10-CM | POA: Diagnosis not present

## 2024-06-22 DIAGNOSIS — R52 Pain, unspecified: Secondary | ICD-10-CM | POA: Diagnosis not present

## 2024-06-22 DIAGNOSIS — Z681 Body mass index (BMI) 19 or less, adult: Secondary | ICD-10-CM | POA: Diagnosis not present

## 2024-06-22 DIAGNOSIS — Z299 Encounter for prophylactic measures, unspecified: Secondary | ICD-10-CM | POA: Diagnosis not present

## 2024-06-22 DIAGNOSIS — R531 Weakness: Secondary | ICD-10-CM | POA: Diagnosis not present

## 2024-06-22 DIAGNOSIS — J441 Chronic obstructive pulmonary disease with (acute) exacerbation: Secondary | ICD-10-CM | POA: Diagnosis not present

## 2024-06-22 DIAGNOSIS — R059 Cough, unspecified: Secondary | ICD-10-CM | POA: Diagnosis not present

## 2024-07-19 DIAGNOSIS — J432 Centrilobular emphysema: Secondary | ICD-10-CM | POA: Diagnosis not present

## 2024-07-28 DIAGNOSIS — Z299 Encounter for prophylactic measures, unspecified: Secondary | ICD-10-CM | POA: Diagnosis not present

## 2024-07-28 DIAGNOSIS — R5383 Other fatigue: Secondary | ICD-10-CM | POA: Diagnosis not present

## 2024-07-28 DIAGNOSIS — I10 Essential (primary) hypertension: Secondary | ICD-10-CM | POA: Diagnosis not present

## 2024-07-28 DIAGNOSIS — R52 Pain, unspecified: Secondary | ICD-10-CM | POA: Diagnosis not present

## 2024-07-28 DIAGNOSIS — Z7189 Other specified counseling: Secondary | ICD-10-CM | POA: Diagnosis not present

## 2024-07-28 DIAGNOSIS — Z0000 Encounter for general adult medical examination without abnormal findings: Secondary | ICD-10-CM | POA: Diagnosis not present

## 2024-07-28 DIAGNOSIS — Z79899 Other long term (current) drug therapy: Secondary | ICD-10-CM | POA: Diagnosis not present

## 2024-07-28 DIAGNOSIS — E7800 Pure hypercholesterolemia, unspecified: Secondary | ICD-10-CM | POA: Diagnosis not present

## 2024-07-28 DIAGNOSIS — Z87891 Personal history of nicotine dependence: Secondary | ICD-10-CM | POA: Diagnosis not present
